# Patient Record
Sex: Female | Born: 1950
Health system: Western US, Academic
[De-identification: ages and names within clinical notes are randomized; demographics above are authoritative.]

---

## 2012-04-07 DIAGNOSIS — Z0000 Encounter for general adult medical examination without abnormal findings: Secondary | ICD-10-CM

## 2012-04-07 DIAGNOSIS — R002 Palpitations: Secondary | ICD-10-CM

## 2012-05-11 DIAGNOSIS — L219 Seborrheic dermatitis, unspecified: Secondary | ICD-10-CM

## 2013-04-14 DIAGNOSIS — Z780 Asymptomatic menopausal state: Secondary | ICD-10-CM

## 2015-11-29 DIAGNOSIS — Z9889 Other specified postprocedural states: Secondary | ICD-10-CM

## 2015-11-29 DIAGNOSIS — H2513 Age-related nuclear cataract, bilateral: Secondary | ICD-10-CM

## 2016-01-09 DIAGNOSIS — N644 Mastodynia: Secondary | ICD-10-CM

## 2016-01-29 DIAGNOSIS — C50912 Malignant neoplasm of unspecified site of left female breast: Secondary | ICD-10-CM

## 2016-06-23 ENCOUNTER — Telehealth: Payer: BLUE CROSS/BLUE SHIELD

## 2016-06-25 ENCOUNTER — Ambulatory Visit: Payer: MEDICARE | Attending: Neurology

## 2016-06-25 DIAGNOSIS — R4189 Other symptoms and signs involving cognitive functions and awareness: Secondary | ICD-10-CM

## 2016-06-26 ENCOUNTER — Ambulatory Visit: Payer: BLUE CROSS/BLUE SHIELD

## 2016-06-26 DIAGNOSIS — C50912 Malignant neoplasm of unspecified site of left female breast: Secondary | ICD-10-CM

## 2016-06-26 DIAGNOSIS — R4189 Other symptoms and signs involving cognitive functions and awareness: Secondary | ICD-10-CM

## 2016-07-03 ENCOUNTER — Ambulatory Visit: Payer: BC Managed Care – POS

## 2016-07-10 ENCOUNTER — Ambulatory Visit: Payer: BLUE CROSS/BLUE SHIELD

## 2016-07-18 ENCOUNTER — Ambulatory Visit: Payer: BC Managed Care – POS

## 2016-07-18 ENCOUNTER — Telehealth: Payer: BC Managed Care – POS

## 2016-07-22 ENCOUNTER — Ambulatory Visit: Payer: BC Managed Care – POS | Attending: Neurology

## 2016-07-28 ENCOUNTER — Ambulatory Visit: Payer: BC Managed Care – POS

## 2016-07-28 ENCOUNTER — Ambulatory Visit: Payer: BLUE CROSS/BLUE SHIELD

## 2016-07-28 DIAGNOSIS — Z1283 Encounter for screening for malignant neoplasm of skin: Secondary | ICD-10-CM

## 2016-07-28 DIAGNOSIS — D229 Melanocytic nevi, unspecified: Secondary | ICD-10-CM

## 2016-07-28 DIAGNOSIS — Z853 Personal history of malignant neoplasm of breast: Secondary | ICD-10-CM

## 2016-07-28 DIAGNOSIS — L309 Dermatitis, unspecified: Secondary | ICD-10-CM

## 2016-07-28 DIAGNOSIS — L820 Inflamed seborrheic keratosis: Secondary | ICD-10-CM

## 2016-08-12 ENCOUNTER — Ambulatory Visit: Payer: BC Managed Care – POS | Attending: Audiologist

## 2016-08-13 ENCOUNTER — Ambulatory Visit: Payer: BC Managed Care – POS

## 2016-08-13 DIAGNOSIS — M810 Age-related osteoporosis without current pathological fracture: Secondary | ICD-10-CM

## 2016-08-13 DIAGNOSIS — Z780 Asymptomatic menopausal state: Secondary | ICD-10-CM

## 2016-08-15 DIAGNOSIS — H903 Sensorineural hearing loss, bilateral: Secondary | ICD-10-CM

## 2016-08-31 ENCOUNTER — Ambulatory Visit: Payer: BLUE CROSS/BLUE SHIELD

## 2016-09-02 ENCOUNTER — Ambulatory Visit: Payer: BLUE CROSS/BLUE SHIELD | Attending: Neurology

## 2016-09-02 ENCOUNTER — Ambulatory Visit: Payer: MEDICARE

## 2016-09-02 DIAGNOSIS — R4189 Other symptoms and signs involving cognitive functions and awareness: Secondary | ICD-10-CM

## 2016-09-02 DIAGNOSIS — I83819 Varicose veins of unspecified lower extremities with pain: Secondary | ICD-10-CM

## 2016-09-04 MED ORDER — ESTRADIOL 10 MCG VA TABS
10 ug | ORAL_TABLET | VAGINAL | 3 refills | Status: AC
Start: 2016-09-04 — End: 2017-08-13

## 2016-09-23 ENCOUNTER — Ambulatory Visit: Payer: BLUE CROSS/BLUE SHIELD | Attending: Rheumatology

## 2016-09-23 DIAGNOSIS — M19042 Primary osteoarthritis, left hand: Secondary | ICD-10-CM

## 2016-09-23 DIAGNOSIS — G4700 Insomnia, unspecified: Secondary | ICD-10-CM

## 2016-09-23 DIAGNOSIS — M810 Age-related osteoporosis without current pathological fracture: Secondary | ICD-10-CM

## 2016-09-23 DIAGNOSIS — M19041 Primary osteoarthritis, right hand: Secondary | ICD-10-CM

## 2016-09-23 DIAGNOSIS — M797 Fibromyalgia: Secondary | ICD-10-CM

## 2016-09-23 DIAGNOSIS — M542 Cervicalgia: Secondary | ICD-10-CM

## 2016-09-24 ENCOUNTER — Ambulatory Visit: Payer: BLUE CROSS/BLUE SHIELD | Attending: Audiologist-Hearing Aid Fitter

## 2016-09-24 DIAGNOSIS — H8091 Unspecified otosclerosis, right ear: Secondary | ICD-10-CM

## 2016-09-24 DIAGNOSIS — H903 Sensorineural hearing loss, bilateral: Secondary | ICD-10-CM

## 2016-09-29 ENCOUNTER — Telehealth: Payer: BLUE CROSS/BLUE SHIELD

## 2016-09-30 ENCOUNTER — Telehealth: Payer: BC Managed Care – POS

## 2016-10-16 ENCOUNTER — Telehealth: Payer: BLUE CROSS/BLUE SHIELD

## 2016-10-20 ENCOUNTER — Ambulatory Visit: Payer: BLUE CROSS/BLUE SHIELD | Attending: Vascular Surgery

## 2016-10-20 DIAGNOSIS — I872 Venous insufficiency (chronic) (peripheral): Secondary | ICD-10-CM

## 2016-12-02 ENCOUNTER — Telehealth: Payer: BLUE CROSS/BLUE SHIELD

## 2016-12-03 ENCOUNTER — Ambulatory Visit: Payer: BC Managed Care – POS

## 2016-12-09 ENCOUNTER — Ambulatory Visit: Payer: BC Managed Care – POS | Attending: Rheumatology

## 2016-12-10 ENCOUNTER — Ambulatory Visit: Payer: BLUE CROSS/BLUE SHIELD

## 2016-12-12 ENCOUNTER — Telehealth: Payer: BLUE CROSS/BLUE SHIELD

## 2016-12-12 ENCOUNTER — Ambulatory Visit: Payer: BLUE CROSS/BLUE SHIELD | Attending: Radiation Oncology

## 2016-12-16 ENCOUNTER — Inpatient Hospital Stay: Payer: BLUE CROSS/BLUE SHIELD | Attending: Radiation Oncology

## 2016-12-16 DIAGNOSIS — Z170 Estrogen receptor positive status [ER+]: Secondary | ICD-10-CM

## 2016-12-16 DIAGNOSIS — C50112 Malignant neoplasm of central portion of left female breast: Secondary | ICD-10-CM

## 2016-12-16 NOTE — Progress Notes
BREAST FOLLOW-UP OFFICE VISIT NOTE    ???PATIENT: Robin Spencer  ???MRN: [id number]  ???DOB: [birth year]    ???DATE OF SERVICE: 12/16/2016    ???REFERRING PRACTITIONER: Robin Spencer., MD  ???PRIMARY CARE PROVIDER: Pregler, Weyman Croon., MD  ???RESIDENT PHYSICIAN: Robin Bucker, MD  ???ATTENDING PHYSICIAN: Robin Bucker, MD     ???CHIEF COMPLAINT: 2 week follow up after completion of RT    ???IDENTIFYING DATA: Robin Spencer is a 66 y.o. female with a pT2N0(i-), Grade 1, ER+, PR-, Her2- IDC of the left breast s/p BCS+SNB on 02/29/16. She completed RT 04/24/16, on tamoxifen.    ???RADIATION HISTORY: She received 42.56 Gy in 16 fractions to the breast followed by a boost of 10 Gy in 4 fractions to the lumpectomy cavity. Radiation was completed on 04/24/2016.     ???DATA OF INTERVAL HISTORY:  ??? Breast-FU Data Fields ~ Data ~ Comments    ??? Most recent imaging date (m/d/yyyy) ~ 12/11/16 bilat mm and [place] benign ~     ??? Local Failure (LF) ~  No Local Failure ~     ??? If LF, date (m/d/yyyy) ~  ~     ??? Regional failure (RF) ~  No Regional Failure ~     ??? If RF, date (m/d/yyyy) ~  ~     ??? Distant Mets (DM) ~  No Distant Metastases ~     ??? If DM, date (m/d/yyyy) ~  ~     ??? Any failure within RT field? ~  No ~     ??? RT related complications ~  No ~     ??? Complications ~  ~     ??? Highest complication grade  (CTCAE) ~  ---- ~  ???     Subjective:       Interval History: [name] returns for routine posttreatment follow up since completing radiation 7 months ago. She reports feeling generally well. Re the treated left breast, she reports it is paler and the NAC is paler and less sensitive and the left breast is larger and fuller than the right and is painful on a daily basis.  She does endorse a h/o baseline bilat breast tenderness which remains but left has been more tender than the right post treatment.  She went to a rheumatologist for neck and back pain and was told she had fibromyalgia. She denies cough, shortness of breath, swelling in the arm, residual fatigue, decreased range of motion in the upper extremity or shoulder stiffness, headaches.      On tamoxifen and following with Dr Robin Spencer, last seen 08/13/16. Is reporting hot flashes and vaginal dryness.  She is on vaginal estrogen inserts which per her report were ok'd by Dr Robin Spencer.    Bilat mm and [place] 12/11/16: ext dense, benign       PERTINENT PAST MEDICAL HISTORY:   Past Medical History:   Diagnosis Date   ??? Anxiety    ??? Eczema    ??? Fibroids    ??? GERD (gastroesophageal reflux disease)     esophagitis   ??? History of migraine headaches    ??? History of radiation therapy 04/2016    L breast   ??? Otosclerosis    ??? Post-operative nausea and vomiting    ??? Rectal fissure    ??? Vitreous detachment      Past Surgical History:   Procedure Laterality Date   ??? BREAST BIOPSY  1986  Fibroadenoma, ductal hyperplasia   ??? Laparoscopy for subfertility     ??? Stapedectomy, right         GYN HISTORY:   OB History   Obstetric Comments   Menses started: 49   Gravida: 0   Para:  0   Age of first live birth: n/a    Breast feed: n/a    Contraceptives/Type:  Y, 18-30    Menopause reached at: 14   Hormone replacement/Type: Y, 54-64    Fertility Tx: Y        ALLERGIES:   Allergies as of 12/16/2016 - Review Complete 12/16/2016   Allergen Reaction Noted   ??? Penicillins Other (See Comments) 01/08/2012   ??? Menadiol sodium diphosphate Rash 03/19/2016   ??? Other  02/29/2016   ??? Acetaminophen Rash 03/02/2016     MEDICATIONS:   Current Outpatient Prescriptions   Medication Sig   ??? acyclovir 400 mg tablet Take 1 tablet (400 mg total) by mouth two (2) times daily.   ??? BIOTIN PO Take 1 tablet by mouth daily .     ??? chlorhexidine 0.12% solution RINSE 15 ML'S TWICE DAILY AFTER BREAKFAST/BEFORE BEDTIME FOLLOWING BRUSHING AND FLOSSING   ??? Cholecalciferol (VITAMIN D) 1000 unit tablet Take 1,000 Units by mouth daily 200 units x5 days a wk     ??? cyanocobalamin 500 mcg tablet Take 500 mcg by mouth daily. ??? estradiol (VAGIFEM) 10 mcg vaginal tablet Place 1 tablet (10 mcg total) vaginally two (2) times a week.   ??? Omega-3 Fatty Acids (FISH OIL PO) Take 1,400 mg by mouth two (2) times daily.   ??? Prasterone (INTRAROSA) 6.5 MG INST Place 1 Dose vaginally at bedtime.   ??? tamoxifen 20 mg tablet Take 1 tablet (20 mg total) by mouth daily.   ??? triamcinolone 0.5% cream Apply topically as needed for.   ??? UNABLE TO FIND Med Name: [place] supplement- Epimedium, Tumeric, [place], Fleeceflower, EPA/DHA .     No current facility-administered medications for this encounter.        SOCIAL HX:   Social History     Social History   ??? Marital status: Married     Spouse name: N/A   ??? Number of children: N/A   ??? Years of education: N/A     Occupational History   ??? Not on file.     Social History Main Topics   ??? Smoking status: Never Smoker   ??? Smokeless tobacco: Never Used   ??? Alcohol use No   ??? Drug use: No   ??? Sexual activity: Yes     Partners: Male     Birth control/ protection: None     Other Topics Concern   ??? Not on file     Social History Narrative    Patient is married, and has two children. She is of Ashkenazi Agricultural engineer.        Work: Retired from [employer] relations        Exercise:        Diet:        Transfusion:        Religion:           FAMILY HX:    Family History   Problem Relation Age of Onset   ??? Hypertension Father    ??? Stroke Father    ??? Hyperlipidemia Father    ??? Depression Father    ??? Prostate cancer Father 4   ??? Hypertension Mother    ???  Asthma Mother    ??? Thyroid disease Mother    ??? Endometrial cancer Mother 42   ??? Prostate cancer Brother 42   ??? Breast cancer Neg Hx    ??? Ovarian cancer Neg Hx    ??? Malignant hypertension Neg Hx       Objective:      Physical Exam:  BP 129/76  ~ Pulse 70  ~ Temp 36.9 ???C (98.4 ???F) (Forehead)  ~ Resp 16  ~ Wt 123 lb (55.8 kg)  ~ LMP  (LMP Unknown)  ~ BMI 21.11 kg/m???    GENERAL: The patient is a well-developed, well-nourished, female in no acute distress. HEENT: Normocephalic and atraumatic. Anicteric sclerae. Mucous membranes are moist. Hearing is intact bilaterally.   NECK: No thyromegaly.  BREASTS: Visualization reveals very mild residual hyperpigmentation on left and left NAC is mildly hypopigmented vs right.  Left breast slightly larger than right. Palpation reveals no discrete nodulrity but bilat breasts are very dense centrally.  Very mild cutaneous and subcutaneous fibrosis on left.  LYMPHATICS: No sclav/iclav/axillary LAD palpated bilaterally  CHEST: Non labored breathing.   HEART: Normal sinus rhythm.   ABDOMEN: Soft, nontender, nondistended.   MUSCULOSKELETAL SYSTEM: No upper extremity lymphedema. Shoulder abduction and external rotation fully intact.   NEUROLOGIC EXAM: Patient is alert and oriented. Patient ambulates with a normal gait.    Lab Review / Pathology / Radiology:     See HPI     Assessment:      Robin Spencer is a 66 y.o. female with a pT2N0(i-), Grade 1, ER+, PR-, Her2- IDC of the left breast s/p BCS+SNB on 02/29/16 followed by radiation, on tamoxifen. NED     Plan/ Recommendation:      - Following with Dr. Maye Spencer, on tamoxifen  - Bilateral mammogram and ultrasound due 12/11/17, requisition placed today; supplemental U/S due to extremely dense breast tissue and mammo occult cancer  - I explained that I would be happy to follow up with her at any time in the future in person, by phone, or by email should she have any questions or concerns. I have requested follow up in  6 months.    Thank you kindly for allowing me to participate in the care of this lovely patient.       cc Pregler, Weyman Croon., MD  Robin Spencer., MD    Author: Kennedy Spencer 12/16/2016 1:49 PM

## 2016-12-16 NOTE — Patient Instructions
Thank you for visiting [place] today in [facility].     Please check out at the front desk and schedule your follow-up appointment with Dr. Lindajo Royal in  months. It is very important to follow up as instructed because it allows [place] to provide you the best care possible.      You will be due for follow up mammogram on or after September 2019 .  Please call the Women's Imaging Department at [phone] to schedule the mammogram at your convenience. The [facility] Department will obtain insurance authorization once you have scheduled the appointment.      If you have any questions or concerns between now and your next visit, please feel free to contact us at [phone].     We look forward to seeing you next time.

## 2016-12-18 DIAGNOSIS — M797 Fibromyalgia: Secondary | ICD-10-CM

## 2016-12-22 ENCOUNTER — Telehealth: Payer: BLUE CROSS/BLUE SHIELD

## 2016-12-22 NOTE — Telephone Encounter
Appointment Accommodation Request    MD Name: Dr Patrice Paradise    Appointment Type: Shingles Vaccine     Reason for sooner request: Pt state Robin Spencer advise patient to schedule nurse visit for shingles vaccine.     Date/Time Requested (If any): 12/26/2016 late morning 10:30 or 11AM    Last seen by MD: 06/02/2016    Any Symptoms:  []  Yes  [x]  No      ? If yes, duration of symptoms (how long): None    Patient was offered an appointment but declined.    Patient was advised to seek emergency services if conditions are urgent or emergent.    Patient has been notified of the 24-48 hour turnaround time.

## 2016-12-23 ENCOUNTER — Ambulatory Visit: Payer: MEDICARE | Attending: Rheumatology

## 2016-12-23 DIAGNOSIS — M19042 Primary osteoarthritis, left hand: Secondary | ICD-10-CM

## 2016-12-23 DIAGNOSIS — M797 Fibromyalgia: Secondary | ICD-10-CM

## 2016-12-23 DIAGNOSIS — M19041 Primary osteoarthritis, right hand: Secondary | ICD-10-CM

## 2016-12-23 NOTE — Progress Notes
Pt is a 66 y.o. female who presents today for:  No chief complaint on file.      Subjective:    Last visit consult 09/23/2016:    66 y.o. female referred by Pregler, Weyman Croon., MD for neck, shoulder, hand, and wrist pain and stiffness.   ???  Patient has been having chronic pain on the L side of the neck, particularly when she looks up with limitation in the range of motion. Patient goes to yoga and afterwards she may have some soreness. Patient says ''it is always been here since 2010.'' This was flared by a modified epley maneuver for BPPV treatment.   ???  She has morning stiffness for 15-20 minutes in the neck, shoulders, lateral hips. Patient has arthritis in the R big toe that is being addressed with orthotics. She also knows of arthritis in the small joints of the fingers, particularly middle, ring, and pinky finger PIPs. She has the L 3rd DIP mucoid cyst that occurred after a trauma at the gas pump with nail dystrophy. The DIPs in the R hand have also been getting larger, achy and she feels that her grip strength in her hands is weaker. Denies tingling/numbness in hands. Patient does use the computer for about an hour everyday.   ???  Patient is able to sleep on her sides without discomfort in the hips. Patient says sometimes she does get discomfort in the shoulders when she sleeps on the sides. Denies limitations in range of motion shoulder. Denies h/o frozen shoulder.  ???  Patient has had lower back pain. She recalls about 30 years ago having a bad back being in bed for about 5 weeks. She has a herniated disc L5-S1 but she learned how to manage it. Patient concludes that most of her pain is in the shoulders, neck area, top of iliac bone bilaterally, and hands. Yoga does exacerbate her aches and pains but she likes to do because it ''is good for me.'' Patient has never tried water PT.   ???  Family history significant for Osteoarthritis but not Rheumatoid Arthritis.  ??? She denies h/o psoriasis, IBD, eye inflammation.  ???  Patient had breast cancer diagnosed last year, s/p surgery and on tamoxifen. Fortunately patient has not noticed worsening of the musculoskeletal symptoms on tamoxifen.   ???  Patient says that most nights she does not get 8 hours of sleep with difficulty falling asleep if she wakes up during the night. She finds that an overactive mind keeps her from falling asleep. She usually gets up twice to urinate. When she wakes in the morning she feels ''tired and stiff'' but that gets better with activity. ''I'm accustomed to it.''  ???  Patient is an optimistic person by nature. Denies significant mood problems such as depression and anxiety.   ???  Mother has insomnia and IBS.       Interval history 12/23/2016:    ***                ROS No Yes   No Yes    No Yes  Fever [x]   []  Vision loss [x]   []  Unexpected bleeding [x]   []       Impression:     - Fibromyalgia. H/o migraine headaches before menopause.    []  controled            []  low activity           []  moderate activity []  highly active         -  Osteoarthritis hands   []  controled            []  low activity           []  moderate activity []  highly active         - Neck pain,  likely combination of myofascial pain and spondylosis    []  controled            []  low activity           []  moderate activity []  highly active         - Osteoporosis, under the care of Dr. Fabienne Bruns    []  controled            []  low activity           []  moderate activity []  highly active         - Insomnia   []  controled            []  low activity           []  moderate activity []  highly active             Lab Results   Component Value Date    VITD25OH 23 09/23/2016    VITD25OH 22 06/26/2016     Plan:   - Blood tests today  ???  - Xray cervical spine, neck pain likely combination of myofascial pain and spondylosis   ???  - Consider low dose Gabapentin   ???  - Patient can participate in the shared medical appointment visit for education of fibromyalgia diagnosis and treatment, next upcoming shared medical appointment: September 25th at 4:30 PM in our office.  ???  - Referral to [facility] for treatment of fibromyalgia. Consider massage, acupuncture.   ???  - Referral to endocrinologist, Dr. Hetty Ely, for evaluation and treatment of osteoporosis  ???  - Referral to water PT for treatment of fibromyalgia and insomnia   Try PT Programmer, systems Specialist)              [facility]              [address]                               [address]              [address]                                                         [address]              [place], [place] [address]                                  [place], [place] [address]              [phone]                                               [phone]  ???  - Referred to Alesia Morin, occupational therapist for treatment of fibromyalgia and insomnia  [phone]  [email or web address]  [address], [place] [address]  ???  - Vit D level is 22. Per new reference range of normal vitamin D level, the lower limit is now considered to be 20 where as in the past it was 30. You are now in the low normal.  Continue vit D and ideally is to bring the level in the 30-40 by increasing your OTC daily dose of vit d3. Check level today.  ???  - Please add magnesium citrate 250-300 mg daily. Patient may consider calcium citrate 500 mg in combination with 250 mg daily.  ???  - Continue turmeric, ginger, and fish oil supplementation   ???  - Patient can talk to her breast oncologist about the safety of avocado soybean unsaponfiable (ASU) supplementation for Osteoarthritis   ???  - Patient can try OTC lemon balm tea to help with tension and stress     No orders of the defined types were placed in this encounter.      I discussed with patient at length need of regular exercise, weight management and methods of coping with psychosocial stressors.   I reviewed the plan of care in detail with patient. Patient had the opportunity to ask questions and expressed satisfaction with the plan of care.  The benefits and risks of medications were also explained in detail.    []  If box is checked, patient advised to monitor CBC (white blood cell count, hemoglobin, platelet count), liver enzyme levels (AST, ALT), and kidney function (creatinine) every 3 months for possible __ side effects.    []  If box is checked, patient is taking plaquenil (hydroxychloroquine), and advised to schedule retinal exam/eye exam by ophthomology once a year.     []  Total of  __  minutes spent on the visit, with    >50% of time spent directly counseling the patient.     See A/P section for content of discussion        Procedures:         Past Medical History:   No specialty comments available.    Patient Active Problem List    Diagnosis Date Noted   ??? Syncope and collapse 06/25/2016     2018, ECG/Labs/MRI w/o gad unrevealing, saw neurology Dr. Lorina Rabon, plan; EEG, followup      ??? Immune to measles 05/09/2016     (+) Ab 2018     ??? Invasive ductal carcinoma of left breast, ER+PR+HER2- 01/29/2016     2017, Bilateral tomosynthesis screening mammogram on 12/11/2015. There were no suspicious findings seen. She had postsurgical changes in both breasts. Patient then developed bilateral breast pain, left greater than right. She was sent for bilateral diagnostic ultrasounds on 01/15/2016. The right breast was negative. However, in the left breast, there was an irregular mass seen with indistinct margins, measuring 5 x 6 x 4 mm, at 12 o'clock, 6 cm from the nipple. Her left axilla was negative. Genetic testing: NO MUTATION DETECTED/This panel tests three Founder Mutations in the BRCA1/2 genes (BRCA1 187delAG, BRCA1 5385insC, BRCA2 6174delT; lumpectomy: sentinal node negative x 2.  TAM recommended by oncology, annual endometrial surveillance given history by GYN.      ??? Breast pain 01/09/2016     [facility] plan per NP, imaging, lifestyle changes     ??? Cataract, nuclear sclerotic, both eyes 11/29/2015  Not yet visually significant. Continue observation.      ??? Status post LASIK surgery of both eyes 11/29/2015     Performed by Dr. Ellin Goodie (2000 ?). Originally set with left eye for distance and low myopia for right, but now near emmetropia both eyes.     ??? Fibroid 04/09/2015     2017 on ultrasound.      ??? BPPV (benign paroxysmal positional vertigo) 07/19/2013   ??? Menopause 04/14/2013   ??? Seborrheic dermatitis 05/11/2012   ??? 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 PPD positive 04/07/2012   ??? Palpitations 04/07/2012     2009 ER PACs, PVCs, sinus arrythmia, ECHO, Stress ECHO WNL 2009     ??? OA (osteoarthritis) 04/07/2012     Hands 2011, anti CCP neg, no evidence inflammation     ??? Visit for preventive health examination 04/07/2012     Colo noscopy= hyperplastic polyps 2017     ??? Hearing loss, right 04/07/2012     Otosclerosis, s/p stapedectomy     ??? Eye allergies 01/08/2012     Using ketotifen as needed but could consider using on a more regular basis. May also use artificial tears as needed.     ??? Osteoporosis 11/10/2011     DEXA 2013, spine-2.4, femoral neck -2.9. On HT for menopause sx and as osteoporosis rx.; PA lumbar spine T-score of -2.7, will begin TAM per oncology.          Past Medical History relevant to rheumatology:         Current Medications Relevant to Rheumatology:     Rheum Meds       Disp Refills Start End    tamoxifen 20 mg tablet 30 tablet 11 04/16/2016     Sig - Route: Take 1 tablet (20 mg total) by mouth daily. - Oral        Rheum Meds Other     Oil Soluble Vitamins Sig    Cholecalciferol (VITAMIN D) 1000 unit tablet Take 1,000 Units by mouth daily 200 units x5 days a wk             Allergies   Allergen Reactions   ??? Penicillins Other (See Comments)     Ampicillin, flesh rash in 1980s ??? Menadiol Sodium Diphosphate Rash   ??? Other      Red  coloring on meds such as advil- not allergic to actual med- just the coloring   ??? Acetaminophen Rash       Physical Exam:   LMP  (LMP Unknown)   Checked Box= body part examined.  Abnormalities noted are listed next to the body part.   NL AbNL  Comment  NL AbNL Comment   General [x]  []   ENT      Eyes       Oropharynx []   []         Conjunct/Sclera [x]   []       Nasal mucosa []   []         Lids/Periorbital [x]   []       Ears []   []         EOM [x]   []    Neck         Pupils [x]   []       Inspect/palp []   []      Psychiatric       Thyroid []   []         Oriented (p,p,t) [x]   []       Neck ROM/spple []   []   Affect [x]   []    Lymphatic         Judgemnt/insite [x]   []       Neck []   []      Neurological       Supraclavicular []   []         CN I-XII [x]   []    Respiratory         Sensation [x]   []       Auscultation []   []      Skin         Chest expansn []   []         Inspect [x]   []    Cardiovascular         Palpate [x]   []       Auscultation []   []         Periungal cap bed []   []       Depnt edema []   []      Musculoskeletal    GI          Motor []   []       Abdomen []   []        Gait []   []       Liver/Spleen []   []        Nailbeds []   [x]    Other findings:      []  28 Joints, or  []  64 joints were examined.    Either exam includes all 4 extremites. Except where noted, the joints examined have FROM, no tenderness or swelling; normal stability, and normal strength.    Abnormalities are coded on homonculus or detailed below:  - L 3rd DIP tenderness to palpation  - tenderness to palpation R hand DIPs   - Bony hypertrophy small joints of the fingers, particularly middle, ring, and pinky finger PIPs.   -  L 3rd DIP mucoid cyst with nail dystrophy.   - Enlarged R hand DIPs  - wrist and elbow exam unremarkable  - shoulders with preserved range of motion   - Negative resisted abduction test in shoulders b/l   - ITB bilaterally tenderness to palpation   - hypersensitivity to light touch ~~~~~~~~~~~~~~~~~~~~~~~~~~~~~~~~~~~~~~~~~~~~~~~~~~~~~~~~~~~~~~~~    RIGHT         LEFT   TMJ [] []   ~ [] []  TMJ   Shoulder [] []  ~ [] []  Shoulder                Left box: Tender   Elbow [] []  ~ [] []  Elbow               Right box: Swollen   Wrist [] []  ~ [] []  Wrist   CMC1 [] []  ~ [] []  CMC1       MCP5 MCP4 MCP3 MCP2 MCP1         ~ MCP1 MCP2 MCP3 MCP4 MCP5       [] []  [] []  [] []  [] []  [] []   [] []  [] []  [] []  [] []  [] []        PIP5 PIP4 PIP3 PIP2 IP1 ~ IP1 PIP2 PIP3 PIP4 PIP5       [] []  [] []  [] []  [] []  [] []   [] []  [] []  [] []  [] []  [] []        DIP5 DIP4 DIP3 DIP2  ~  DIP2 DIP3 DIP4 DIP5       [] []  [] []  [] []  [] []     [] []  [] []  [] []  [] []       Sacroiliac []  ~             []  Sacroiliac                Hip  [] []  ~        [] []   Hip                  Knee  [] []  ~        [] []  Knee        Ankle    [] []  ~        [] []  Ankle           MTP5 MTP4 MTP3 MTP2 MTP1 ~ MTP1 MTP2 MTP3 MTP4 MCP5       [] []  [] []  [] []  [] []  [] []   [] []  [] []  [] []  [] []  [] []        Toe5 Toe4 Toe3 Toe2 Toe1 ~ Toe1 Toe2 Toe3 Toe4 Toe5        [] []  [] []  [] []  [] []  [] []   [] []  [] []  [] []  [] []  [] []                      ~~~~~~~~~~~~~~~~~~~~~~~~~~~~~~~~~~~~~~~~~~~~~~~~~~~~~~~~~~~~~~~~  Fibromyalgia tender point count: __/18      Recent Labs and Test Results:     Lab Results   Component Value Date    CRP <0.3 09/23/2016    CRP <0.5 05/19/2007    SRWEST 6 09/23/2016    SRWEST 11 05/19/2007     Lab Results   Component Value Date    WBC 3.76 (L) 09/23/2016    HGB 13.1 09/23/2016    HCT 37.3 09/23/2016    PLT 202 09/23/2016    NEUTABS 2.08 09/23/2016    LYMPHABS 1.12 (L) 09/23/2016     Lab Results   Component Value Date    GLUCOSE 92 09/23/2016    CREAT 0.73 09/23/2016    CALCIUM 9.1 09/23/2016    TOTPRO 6.6 09/23/2016    ALBUMIN 4.5 09/23/2016    AST 14 09/23/2016    ALT 10 09/23/2016    ALKPHOS 56 09/23/2016     No results found for: C3, C4, DSDNAAB, NDNAABIFA  Lab Results   Component Value Date    PROTCLUR Negative 02/14/2016    BLDUR 1+ (A) 02/14/2016    LEUKESTUR Negative 02/14/2016 RBCSUR 11 02/14/2016    WBCSUR 2 02/14/2016     Lab Results   Component Value Date    VITD25OH 23 09/23/2016    VITD25OH 22 06/26/2016     Lab Results   Component Value Date    CKTOT 64 06/02/2016       No visits with results within 1 Month(s) from this visit.   Latest known visit with results is:   Office Visit on 09/23/2016   Component Date Value   ??? HBs Ag 09/23/2016 Nonreactive    ??? HBs Ab Quant 09/23/2016 <10    ??? HBc Ab, IgM 09/23/2016 Nonreactive    ??? HBc Ab, Total 09/23/2016 Nonreactive    ??? HCV Ab Screen 09/23/2016 Nonreactive    ??? Sodium 09/23/2016 141    ??? Potassium 09/23/2016 4.8    ??? Chloride 09/23/2016 101    ??? Total CO2 09/23/2016 24    ??? Anion Gap 09/23/2016 16    ??? Glucose 09/23/2016 92    ??? GFR Estimate for Non-Afr* 09/23/2016 87    ??? GFR Estimate for African* 09/23/2016 >89    ??? GFR Additional Informati* 09/23/2016 See Comment    ??? Creatinine 09/23/2016 0.73    ??? Urea Nitrogen 09/23/2016 15    ??? Calcium 09/23/2016 9.1    ??? Total Protein 09/23/2016 6.6    ??? Albumin 09/23/2016 4.5    ??? Bilirubin,Total 09/23/2016 0.4    ??? Alkaline Phosphatase 09/23/2016 56    ???  Aspartate Aminotransfera* 09/23/2016 14    ??? Alanine Aminotransferase 09/23/2016 10    ??? Sedimentation rate, Eryt* 09/23/2016 6    ??? C-Reactive Protein 09/23/2016 <0.3    ??? Vitamin D,25-Hydroxy 09/23/2016 23    ??? Antinuclear Ab 09/23/2016 <1:40    ??? Cyclic Citrulline Ab IgG 09/23/2016 4    ??? Rheumatoid Factor 09/23/2016 <10    ??? White Blood Cell Count 09/23/2016 3.76*   ??? Red Blood Cell Count 09/23/2016 3.74*   ??? Hemoglobin 09/23/2016 13.1    ??? Hematocrit 09/23/2016 37.3    ??? Mean Corpuscular Volume 09/23/2016 99.7*   ??? Mean Corpuscular Hemoglo* 09/23/2016 35.0*   ??? MCH Concentration 09/23/2016 35.1    ??? Red Cell Distribution Wi* 09/23/2016 47.0    ??? Red Cell Distribution Wi* 09/23/2016 13.5    ??? Platelet Count, Auto 09/23/2016 202    ??? Mean Platelet Volume 09/23/2016 10.7    ??? Nucleated RBC%, automated 09/23/2016 0.0 ??? Absolute Nucleated RBC C* 09/23/2016 0.00    ??? Neutrophil Abs (Prelim) 09/23/2016 2.08    ??? Neutrophil Percent, Auto 09/23/2016 55.3    ??? Lymphocyte Percent, Auto 09/23/2016 29.8    ??? Monocyte Percent, Auto 09/23/2016 10.6    ??? Eosinophil Percent, Auto 09/23/2016 2.7    ??? Basophil Percent, Auto 09/23/2016 1.1    ??? Immature Granulocytes% 09/23/2016 0.5    ??? Absolute Neut Count 09/23/2016 2.08    ??? Absolute Lymphocyte Count 09/23/2016 1.12*   ??? Absolute Mono Count 09/23/2016 0.40    ??? Absolute Eos Count 09/23/2016 0.10    ??? Absolute Baso Count 09/23/2016 0.04    ??? Absolute Immature Gran C* 09/23/2016 0.02          Attestation:  Scribe Signature:  I, Scharlene Catalina, have scribed for Dr. Linus Mako with the documentation for Rejeana Brock on 12/23/2016 at 8:02 AM.    Physician Signature:  Dr. Linus Mako 12/23/2016 8:02 AM     I have reviewed this note, scribed by Mariane Baumgarten and attest that it is an accurate representation of my H & P and other events of the outpatient visit except if otherwise noted.

## 2016-12-23 NOTE — Telephone Encounter
Patient scheduled for 10/12 @ 10:30 am

## 2016-12-23 NOTE — Patient Instructions
-   Consider trial of low dose naltrexone (LDN).    - Continue PT                          - Increase OTC vitamin D3 supplementation to 5000 IU per day. Repeat vitamin D level in 3 months.     - Continue magnesium citrate 250-300 mg daily and calcium citrate 500 mg in combination with 250 mg daily.    - Continue turmeric, ginger, and fish oil supplementation      - Continue OTC lemon balm tea to help with tension and stress     - Follow with referral to [facility] for treatment of fibromyalgia. Consider massage, acupuncture.     - Follow with referral to endocrinologist, Dr. Deatra Ina, for evaluation and treatment of osteoporosis    - Follow with referral to Skip Mayer, occupational therapist for treatment of fibromyalgia and insomnia  [phone]  [email or web address]  [place], [place]. [address] [place] [address] [address]    - I taught patient the 4-7-8 Breath Relaxation Exercise and Progressive Muscle Relaxation:   Exhale completely through your mouth, making a whoosh sound.  Close your mouth and inhale quietly through your nose to a mental count of four.  Hold your breath for a count of seven.  Exhale completely through your mouth, making a whoosh sound to a count of eight.  This is one breath. Now inhale again and repeat the cycle three more times for a total of four breaths.    From last visit:   - Patient can talk to her breast oncologist about the safety of avocado soybean unsaponfiable (ASU) supplementation for Osteoarthritis

## 2016-12-26 ENCOUNTER — Ambulatory Visit: Payer: BLUE CROSS/BLUE SHIELD

## 2016-12-26 ENCOUNTER — Ambulatory Visit: Payer: BC Managed Care – POS

## 2016-12-26 DIAGNOSIS — Z23 Encounter for immunization: Secondary | ICD-10-CM

## 2016-12-26 MED ORDER — ZOSTER VAC RECOMB ADJUVANTED 50 MCG/0.5ML IM SUSR
0 refills | Status: AC
Start: 2016-12-26 — End: 2017-08-06

## 2017-01-16 ENCOUNTER — Ambulatory Visit: Payer: BLUE CROSS/BLUE SHIELD | Attending: Audiologist-Hearing Aid Fitter

## 2017-01-16 ENCOUNTER — Institutional Professional Consult (permissible substitution): Payer: BC Managed Care – POS

## 2017-01-16 ENCOUNTER — Ambulatory Visit: Payer: BLUE CROSS/BLUE SHIELD

## 2017-01-16 DIAGNOSIS — Z23 Encounter for immunization: Secondary | ICD-10-CM

## 2017-01-16 NOTE — Nursing Note
Patient came in for a Nurse Visit today  01/16/2017 to have  the Influenza vaccine . Patient tolerated injection well.

## 2017-01-21 ENCOUNTER — Ambulatory Visit: Payer: BLUE CROSS/BLUE SHIELD

## 2017-01-21 ENCOUNTER — Ambulatory Visit: Payer: BLUE CROSS/BLUE SHIELD | Attending: "Endocrinology

## 2017-01-21 DIAGNOSIS — R5383 Other fatigue: Secondary | ICD-10-CM

## 2017-01-21 DIAGNOSIS — M810 Age-related osteoporosis without current pathological fracture: Secondary | ICD-10-CM

## 2017-01-21 DIAGNOSIS — F341 Dysthymic disorder: Secondary | ICD-10-CM

## 2017-01-21 DIAGNOSIS — E041 Nontoxic single thyroid nodule: Secondary | ICD-10-CM

## 2017-01-21 NOTE — Progress Notes
OUTPATIENT ENDOCRINOLOGY CONSULTATION    Date of Service: 01/21/2017  Referring Provider: Gabriela Eves., MD  PMD: Pregler, Weyman Croon., MD    Chief Complaint:   Osteoporosis     History of Present Illness:   Robin Spencer is a 66 y.o. female who has a past medical history of Anxiety; Eczema; Fibroids; GERD (gastroesophageal reflux disease); History of migraine headaches; History of radiation therapy (04/2016); Otosclerosis; Post-operative nausea and vomiting; Rectal fissure; and Vitreous detachment. and presents to Endocrinology Clinic for Osteoporosis.    Osteoporosis  The patient was first found to have low bone density in 2013.    Fibromyalgia diagnosed by rheumatologist Dr. Ladona Ridgel, but pt does not feel like her pain is debilitating.    Prev on bioidentical hormones for HRT - stopped in 01/2016 or so upon dx of breast cancer.  Now on tamoxifen.    Constantly tired.  Waking up at least twice in the middle of the night to go urinate, also getting some hot flashes.  Feeling less productive and less motivated.  Feeling mildly depressed.  Has appointment on Friday with therapist.    Not exercising as much.  Prev doing yoga TIW and hiking.  Now not doing yoga.  Walking maybe once a week - recently.    Lost 10 lbs intentionally.      Treatment History   Dates Comments    [x]  No treatment to date            Risk Factors for Accelerated Bone Loss        History of fracture  []  Yes  [x]  No    History of falls  []  Yes  [x]  No    History of malabsorption  []  Yes  [x]  No    History of nephrolithiasis  []  Yes  [x]  No    History of rheumatoid arthritis  []  Yes  [x]  No    History of hyperthyroidism  []  Yes  [x]  No    History of Cushing's  []  Yes  [x]  No    Family history of osteoporosis  [x]  Yes  []  No Osteopenia in mother   Prior/current glucocorticoid use  []  Yes  [x]  No    Antiepilepsy drugs  []  Yes  [x]  No    PPI  []  Yes  []  No Prilosec in the past for weeks only   Aromatase inhibitors  []  Yes  [x]  No Androgen deprivation therapy  []  Yes  [x]  No    EtOH  []  Yes  []  No Rare alcohol   Tobacco  []  Yes  [x]  No    Vitamin D  []  Yes  []  No Vitamin D3 5000 IU daily   Calcium    []  Yes  []  No Was taking calcium supplement then stopped and restarted about 6 weeks ago - taking 630 mg (taking BID, total is 630 mg)  Also taking Mg 150 BID.  Dietary calcium: 1/3 cup yogurt, kale/spinach daily in smoothie, some cheese, 1/4 cup almond milk      Past Medical History:     Past Medical History:   Diagnosis Date   ??? Anxiety    ??? Eczema    ??? Fibroids    ??? GERD (gastroesophageal reflux disease)     esophagitis   ??? History of migraine headaches    ??? History of radiation therapy 04/2016    L breast   ??? Otosclerosis    ??? Post-operative nausea and vomiting    ???  Rectal fissure    ??? Vitreous detachment        Past Surgical History:     Past Surgical History:   Procedure Laterality Date   ??? BREAST BIOPSY  1986    Fibroadenoma, ductal hyperplasia   ??? Laparoscopy for subfertility     ??? Stapedectomy, right       Medications:     Current Outpatient Prescriptions   Medication Sig   ??? acyclovir 400 mg tablet Take 1 tablet (400 mg total) by mouth two (2) times daily.   ??? BIOTIN PO Take 1 tablet by mouth daily .     ??? CALCIUM CITRATE PO Take 630 mg by mouth daily.   ??? Cholecalciferol (VITAMIN D) 1000 unit tablet Take 1,000 Units by mouth daily 200 units x5 days a wk     ??? cyanocobalamin 500 mcg tablet Take 500 mcg by mouth daily.   ??? estradiol (VAGIFEM) 10 mcg vaginal tablet Place 1 tablet (10 mcg total) vaginally two (2) times a week.   ??? MAGNESIUM CITRATE PO Take 150 mg by mouth two (2) times daily.   ??? tamoxifen 20 mg tablet Take 1 tablet (20 mg total) by mouth daily.   ??? triamcinolone 0.5% cream Apply topically as needed for.   ??? zoster vac recomb adjuvanted (SHINGRIX) 50 mcg injection 2nd of 2 Shingrix vaccinations now at pharmacy.   ??? Omega-3 Fatty Acids (FISH OIL PO) Take 1,400 mg by mouth two (2) times daily. ??? UNABLE TO FIND Med Name: [place] supplement- Epimedium, Tumeric, Japanese, Fleeceflower, EPA/DHA .     No current facility-administered medications for this visit.      Allergies:     Allergies   Allergen Reactions   ??? Penicillins Other (See Comments)     Ampicillin, flesh rash in 1980s   ??? Menadiol Sodium Diphosphate Rash   ??? Other      [place] coloring on meds such as advil- not allergic to actual med- just the coloring   ??? Acetaminophen Rash     Social History:    reports that she has never smoked. She has never used smokeless tobacco. She reports that she does not drink alcohol or use drugs.   Social History     Social History Narrative    Patient is married, and has two children. She is of Ashkenazi Agricultural engineer.        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 Hypertension Father    ??? Stroke Father    ??? Hyperlipidemia Father    ??? Depression Father    ??? Prostate cancer Father 5   ??? Hypertension Mother    ??? Asthma Mother    ??? Endometrial cancer Mother 68   ??? Hyperthyroidism Mother    ??? Prostate cancer Brother 12   ??? Breast cancer Neg Hx    ??? Ovarian cancer Neg Hx    ??? Malignant hypertension Neg Hx      Review of Systems:   14 system ROS negative except as described above and in the HPI.  Physical Examination:   Vital Signs: BP (!) 88/61  ~ Pulse 81  ~  Ht 5' 4'' (1.626 m)  ~ Wt 124 lb (56.2 kg)  ~ LMP  (LMP Unknown)  ~ BMI 21.28 kg/m???  Body mass index is 21.28 kg/m???.   Wt Readings from Last 3 Encounters:   01/21/17 124 lb (56.2 kg)   12/23/16 123 lb 6.4 oz (56 kg)   12/16/16 123 lb (55.8 kg)      General: Well-developed, well-nourished, in no acute distress.   HEENT: Normocephalic, atraumatic. PERRL. EOMI. No lid lag.  No proptosis.  No scleral icterus. Oropharynx clear. Moist mucous membranes. Neck: Supple. No cervical lymphadenopathy. No thyromegaly. Small left sided thyroid nodule palpated.  Cardiovascular: Regular rate and rhythm S1 and S2 normal. No murmurs, rubs, or gallops.   Pulmonary: Clear to auscultation bilaterally. Normal respiratory effort.   Abdominal: Soft, nontender, and nondistended. Normoactive bowel sounds.   Extremities: Pulses 2+ radial and dorsalis pedis bilaterally. No lower extremity edema.  Skin: No rashes, no jaundice.  MSK: minimal kyphosis.  no tenderness to palpation of spine.  Neuro: AAOx4, moving all extremities, gross sensation intact bilaterally.  2+ biceps reflexes B/L.  No tremor of outstretched hands.  Psych: Normal affect.    Laboratory Results:   I have   [] reviewed radiology,  [] reviewed labs,  [] reviewed & summarized old records, [] requested outside medical records.    Lab Results   Component Value Date    CALCIUM 9.1 09/23/2016    CREAT 0.73 09/23/2016    ALBUMIN 4.5 09/23/2016    VITD25OH 23 09/23/2016    TSH 1.3 06/26/2016    ALKPHOS 56 09/23/2016       Lab Results   Component Value Date    WBC 3.76 (L) 09/23/2016    HGB 13.1 09/23/2016    HCT 37.3 09/23/2016    MCV 99.7 (H) 09/23/2016    PLT 202 09/23/2016       Bone Turnover Markers  Date UNTx BSAP Serum OC                Studies:      DXA 04/17/16 Lenell Antu)  Assessment:  1. The left hip and femoral neck T-score's of -2.2 and -2.3 respectively, meet the criteria for osteopenia.  2. The PA lumbar spine T-score of -2.7 meets the criteria for osteoporosis according to the [facility] classification.    3. The *lateral lumbar spine T-score = n/a    DXA 11/03/11 ([facility])  BASELINE DUAL ENERGY X-RAY ABSORPTIOMETRY (DXA) STUDY  Bone densitometry was performed using a Hologic Discovery A bone densitometer.  ???  PA Spine:       The average bone mineral density from L1-4 is 0.787 g/cm2, 2.4 standard deviations below the mean for normal young adults (*T-scores) or 75% of normal. Compared to age and sex-matched controls (**Z-scores), this density is 0.9 standard deviations   below the mean.  ???  Total Hip:       The bone mineral density in the left total hip is 0.706 g/cm2, 1.9 standard deviations below the mean for normal young adults (*T-scores) or 75% of normal.  Compared to age and sex-matched controls (**Z-scores), this density is 1.0 standard   deviations below the mean.  ???  Femoral Neck:       The bone mineral density in the left femoral neck is 0.531 g/cm2, 2.9 standard deviations below the mean for normal young adults (*T-scores) or 63% of normal.  Compared to age and sex-matched controls (**Z-scores), this density is 1.6 standard   deviations below the  mean.  ???  *T-score compares the BMD of an individual to peak bone mass and expresses the difference as a standard deviation score,**Z-score compares the BMD of an individual with age-matched, gender-matched, and race-matched controls and expresses the difference   as a standard deviation score.  ???  IMPRESSION:  1.  This is a baseline study at the [facility].  2.  The patient has osteoporosis.    DEXA  Date LS T-score FN T-score TH T-score Rad T-score % Change BMD                Assessment:   Jaymi Tinner is a 66 y.o. female seen in the Endocrine clinic for evaluation and management of osteoporosis.    Plan:   1. Osteoporosis, unspecified osteoporosis type, unspecified pathological fracture presence: diagnosed in 2013 on DXA, about stable on DXA in 2018 but on different machine.  Risk factors for accelerated bone loss: +fam hx, post-menopausal woman, Caucasian, thin.  - pt is hesitant to start medication but if bone turnover markers are elevated would be amenable.  - Osteocalcin, N-MID (Bone Gla); Future  - Alkaline Phosphatase,Bone Spec; Future  - N-Telopeptide,random urine; Future  - PTH,Intact & Calcium; Future  - Vitamin D,25-Hydroxy; Future  - Encouraged weight bearing exercise - Goal calcium intake is 1200 mg of elemental calcium per day, ideally more in diet than in supplement.  - Goal vitamin D intake is 800 IU of vitamin D3 per day. Goal vit D 25 OH level is >30.  - Next DXA is planned for 04/2018    2. Thyroid nodule: small nodule palpated  - US thyroid-parathyroid; Future    3. Other fatigue  - TSH; Future  - Free T4; Future    4. Dysthymia: symptoms are concerning for depression  - pt is seeing therapist on Friday    5. Invasive ductal carcinoma of left breast, ER+PR+HER2-  - on Tamoxifen        Patient Instructions     1.  Foods and drinks with calcium   Food Calcium, milligrams   Milk (skim, 2 percent, or whole, 8 oz [240 mL]) 300   Yogurt (6 oz [168 g]) 250   [place] juice (with calcium, 8 oz [240 mL]) 300   Tofu with calcium (1/2 cup [660 g]) 435   Cheese (1 oz [28 g]) 195 to 335 (hard cheese = higher calcium)   Cottage cheese (1/2 cup [630 g]) 130   Ice cream or frozen yogurt (1/2 cup [160 g]) 100   Soy milk (8 oz [240 mL]) 300   Beans (1/2 cup cooked [109 g]) 60 to 80   Dark, leafy green vegetables (1/2 cup cooked [323 g]) 50 to 135   Almonds (24 whole) 70   [place] (1 medium) 60   ???  ??? People who are getting adequate calcium from dietary intake alone (approximately 1200 mg daily) do not need to take calcium supplements.   ???  ??? People with inadequate dietary intake should take supplemental elemental calcium (generally 500 to 1000 mg/day), in divided doses at mealtime, such that their total calcium intake (diet plus supplements) approximates 1200 mg/day  ???  Source: uptodate 2016    2.  Schedule your thyroid ultrasound.  Please call Radiology Central Scheduling to schedule [phone].    3.  Get your labs fasting and in the morning at a [facility] lab near you. If your bone turnover markers are high then I will have you  come back for an appointment so we can discuss your options for treatment of your osteoporosis.      Return in about 1 year (around 01/21/2018).    CC Dr. Ladona Ridgel 45 minutes spent with patient. Greater than 50% of the visit was spent counseling the above issues.    Author:  Lorenza Chick, MD 01/21/2017 10:55 AM

## 2017-01-21 NOTE — Patient Instructions
1.  Foods and drinks with calcium   Food Calcium, milligrams   Milk (skim, 2 percent, or whole, 8 oz [240 mL]) 300   Yogurt (6 oz [168 g]) 250   [place] juice (with calcium, 8 oz [240 mL]) 300   Tofu with calcium (1/2 cup [161 g]) 435   Cheese (1 oz [28 g]) 195 to 335 (hard cheese = higher calcium)   Cottage cheese (1/2 cup [096 g]) 130   Ice cream or frozen yogurt (1/2 cup [045 g]) 100   Soy milk (8 oz [240 mL]) 300   Beans (1/2 cup cooked [409 g]) 60 to 80   Dark, leafy green vegetables (1/2 cup cooked [811 g]) 50 to 135   Almonds (24 whole) 70   [place] (1 medium) 60   ???  ??? People who are getting adequate calcium from dietary intake alone (approximately 1200 mg daily) do not need to take calcium supplements.   ???  ??? People with inadequate dietary intake should take supplemental elemental calcium (generally 500 to 1000 mg/day), in divided doses at mealtime, such that their total calcium intake (diet plus supplements) approximates 1200 mg/day  ???  Source: uptodate 2016    2.  Schedule your thyroid ultrasound.  Please call Radiology Central Scheduling to schedule [phone].    3.  Get your labs fasting and in the morning at a [facility] lab near you. If your bone turnover markers are high then I will have you come back for an appointment so we can discuss your options for treatment of your osteoporosis.

## 2017-01-22 ENCOUNTER — Institutional Professional Consult (permissible substitution): Payer: BC Managed Care – POS

## 2017-01-22 DIAGNOSIS — M810 Age-related osteoporosis without current pathological fracture: Secondary | ICD-10-CM

## 2017-01-22 DIAGNOSIS — R5383 Other fatigue: Secondary | ICD-10-CM

## 2017-01-22 DIAGNOSIS — N644 Mastodynia: Secondary | ICD-10-CM

## 2017-01-22 DIAGNOSIS — R52 Pain, unspecified: Secondary | ICD-10-CM

## 2017-01-23 ENCOUNTER — Ambulatory Visit: Payer: BLUE CROSS/BLUE SHIELD

## 2017-01-23 DIAGNOSIS — E213 Hyperparathyroidism, unspecified: Secondary | ICD-10-CM

## 2017-01-23 LAB — Free T4: FREE T4: 1.3 ng/dL (ref 0.8–1.7)

## 2017-01-23 LAB — Vitamin D,25-Hydroxy: VITAMIN D,25-HYDROXY: 33 ng/mL (ref 20–50)

## 2017-01-23 LAB — PTH, Intact: PTH, INTACT: 62 pg/mL — ABNORMAL HIGH (ref 11–51)

## 2017-01-23 LAB — Calcium for PTH: CALCIUM: 9.3 mg/dL (ref 8.6–10.4)

## 2017-01-23 LAB — TSH: TSH: 1.5 u[IU]/mL (ref 0.3–4.7)

## 2017-01-24 ENCOUNTER — Ambulatory Visit: Payer: BC Managed Care – POS

## 2017-01-25 LAB — Alkaline Phosphatase,Bone Spec: ALKALINE PHOSPHATASE,BONE SPEC: 10.8 ug/L

## 2017-01-27 LAB — Osteocalcin, N-MID (Bone Gla): OSTEOCALCIN, N-MID (BONE GLA): 15 ng/mL (ref 11–50)

## 2017-01-27 LAB — N-Telopeptide,random urine: N-TELOPEPTIDE,UR RANDOM: 22 nmol{BCE}/mmol{creat} (ref 20–275)

## 2017-01-27 NOTE — Telephone Encounter
Patient has been notified Tdap has been updated to her immunization record. Please close this encounter. Thanks

## 2017-01-29 ENCOUNTER — Ambulatory Visit: Payer: BLUE CROSS/BLUE SHIELD

## 2017-02-03 ENCOUNTER — Ambulatory Visit: Payer: BC Managed Care – POS

## 2017-02-04 ENCOUNTER — Ambulatory Visit: Payer: BC Managed Care – POS

## 2017-02-04 DIAGNOSIS — E213 Hyperparathyroidism, unspecified: Secondary | ICD-10-CM

## 2017-02-10 ENCOUNTER — Ambulatory Visit: Payer: PRIVATE HEALTH INSURANCE

## 2017-02-11 ENCOUNTER — Ambulatory Visit: Payer: BLUE CROSS/BLUE SHIELD

## 2017-02-11 ENCOUNTER — Inpatient Hospital Stay: Payer: BC Managed Care – POS | Attending: "Endocrinology

## 2017-02-11 ENCOUNTER — Ambulatory Visit: Payer: BC Managed Care – POS

## 2017-02-11 DIAGNOSIS — E041 Nontoxic single thyroid nodule: Secondary | ICD-10-CM

## 2017-02-13 ENCOUNTER — Ambulatory Visit: Payer: BC Managed Care – POS

## 2017-02-18 ENCOUNTER — Ambulatory Visit: Payer: BC Managed Care – POS

## 2017-02-25 ENCOUNTER — Ambulatory Visit: Payer: BLUE CROSS/BLUE SHIELD

## 2017-02-25 DIAGNOSIS — Z79890 Hormone replacement therapy (postmenopausal): Secondary | ICD-10-CM

## 2017-02-25 NOTE — Progress Notes
BREAST MEDICAL ONCOLOGY PROGRESS NOTE     Patient name: Robin Spencer  PCP: Pregler, Weyman Croon., MD  Referring provider: Cristopher Peru MD, P*      MRN: [id number]  DOB: [birth year]  Age: 66 y.o.      Reason for referral: breast cancer  Chief Complaint   Patient presents with   ??? Breast Cancer     History was obtained from the patient and medical records.  History of the Present Illness   Robin Spencer is a 66 y.o. post-menopausal female with ER+PR+HER2- (IHC) invasive ductal carcinoma of the left breast, s/p lumpectomy with sentinel lymph node excision 02/29/16, s/p adjuvant XRT 03/27/16-04/25/16, now on tamoxifen.     Robin Spencer presented with acute RIGHT breast pain. On 01/15/16 bilateral diagnostic breast ultrasound, she was found to have an irregular mass measuring 5x6x54mm at 12 o'clock, 6cm from the nipple in the LEFT breast. There was no axillary lymphadenopathy.    Robin Spencer underwent biopsy of the left breast mass on 01/22/16. On pathology, she was found to have an invasive ductal carcinoma, grade 1 with mBRS 5/9, in a background of DCIS (micropapillary and cribriform, no necrosis, low nuclear grade). ER+ (95% 3+), PR+ (40% 1-2+), HER2- (IHC 1+, FISH with HER2/CEN17 ratio 1.0), Ki67 1-2%.     Progress / Interval Events   02/29/16 left lumpectomy with sentinel lymph node biopsy. Invasive ductal carcinoma, grade 1 with mBRS 5/9, 2.8cm. ER+ (90%, 2-3+), PR+ (0%), HER2- (IHC 1+, HER2/CEP17 1.0 with 2.0 copies of HER2 per cell), Ki67 <1%. 2.1cm DCIS (clinging and micropapillary type with focal central necrosis. 0/2 lymph nodes involved. No LVI. pT2N0(i-).  03/27/16-04/18/16 Radiation therapy.   04/16/16 Presents to discuss adjuvant hormonal therapy. Started tamoxifen because continues on hormonal therapy.   08/13/16 Follow up on tamoxifen x3 months. Generally doing well except for hot flashes every 50 minutes. Vaginal atrophy much improved on DHEA. Some residual pain in the left breast. 12/11/16 mammogram + U/S, screening: no evidence of malignancy.  02/25/17 Tolerating tamoxifen well. Worried about her son, who is currently hospitalized for diarrheal illness.    Review of Systems   Constitutional: No fevers or chills. No weight loss or appetite changes.   Eyes: No recent blurry vision, double vision or visual changes.  ENT: No recent sinus pain or congestion. No sore throat or mouth sores.   Neck: No neck pain or stiffness.  Breasts: healed well. Breast pain, less than prior but persistent.  Cardiovascular: No chest pain or pressure, no palpitations. No swelling.  Pulmonary: No shortness of breath, no cough or wheezing.   Gastrointestinal: No abdominal pain. No nausea/vomiting. No diarrhea or constipation. No melena or BRBPR.   Genitourinary: No urinary frequency, urgency. + dysuria. + pain with sexual intercourse.  Musculoskeletal: No joint pain, muscle pain or back pain.   Neurologic: No headaches. No numbness, tingling or weakness.   Dermatologic: No rash, pruritus or recent skin changes.   Hematologic: No abnormal bruising or bleeding.   Endocrine: No polyuria, polydipsia, heat or cold intolerance. + worsening hot flashes and difficulty sleeping.  Psychiatric: No depressive symptoms. No suicidal or homicidal ideation.     Medical History Surgical History   Past Medical History:   Diagnosis Date   ??? Anxiety    ??? Eczema    ??? Fibroids    ??? GERD (gastroesophageal reflux disease)     esophagitis   ??? History of migraine headaches    ??? History of  radiation therapy 04/2016    L breast   ??? Otosclerosis    ??? Post-operative nausea and vomiting    ??? Rectal fissure    ??? Vitreous detachment     Past Surgical History:   Procedure Laterality Date   ??? BREAST BIOPSY  1986    Fibroadenoma, ductal hyperplasia   ??? Laparoscopy for subfertility     ??? Stapedectomy, right          Family History Social History   The family history includes Asthma in her mother; Depression in her father; Endometrial cancer (age of onset: 7) in her mother; Hyperlipidemia in her father; Hypertension in her father and mother; Hyperthyroidism in her mother; Prostate cancer (age of onset: 67) in her brother; Prostate cancer (age of onset: 26) in her father; Stroke in her father. The patient  reports that she has never smoked. She has never used smokeless tobacco. She reports that she does not drink alcohol or use drugs.   Ob/Gyn History    Obstetric History     No data available   Obstetric Comments   Menses started: 59   Gravida: 0   Para:  0   Age of first live birth: n/a    Breast feed: n/a    Contraceptives/Type:  Y, 18-30    Menopause reached at: 42   Hormone replacement/Type: Y, 50-64    Fertility Tx: Y    Occupational History   ??? Not on file.     Social History     Social History Narrative    Patient is married, and has two children. She is of Ashkenazi Agricultural engineer.        Work: Retired from [employer] relations        Exercise:        Diet:        Transfusion:        Religion:        Son on the autism spectrum, has bulimia.      Medications     Allergies   Allergen Reactions   ??? Penicillins Other (See Comments)     Ampicillin, flesh rash in 1980s   ??? Menadiol Sodium Diphosphate Rash   ??? Other      Red [place] coloring on meds such as advil- not allergic to actual med- just the coloring   ??? Acetaminophen Rash     Current Outpatient Prescriptions   Medication Sig   ??? acyclovir 400 mg tablet Take 1 tablet (400 mg total) by mouth two (2) times daily.   ??? BIOTIN PO Take 1 tablet by mouth daily .     ??? CALCIUM CITRATE PO Take 630 mg by mouth daily.   ??? Cholecalciferol (VITAMIN D) 1000 unit tablet Take 1,000 Units by mouth daily 200 units x5 days a wk     ??? cyanocobalamin 500 mcg tablet Take 500 mcg by mouth daily.   ??? estradiol (VAGIFEM) 10 mcg vaginal tablet Place 1 tablet (10 mcg total) vaginally two (2) times a week.   ??? MAGNESIUM CITRATE PO Take 150 mg by mouth two (2) times daily. ??? Omega-3 Fatty Acids (FISH OIL PO) Take 1,400 mg by mouth two (2) times daily.   ??? tamoxifen 20 mg tablet Take 1 tablet (20 mg total) by mouth daily.   ??? triamcinolone 0.5% cream Apply topically as needed for.   ??? UNABLE TO FIND Med Name: [place] supplement- Epimedium, Tumeric, Japanese, Fleeceflower, EPA/DHA .   ???  zoster vac recomb adjuvanted (SHINGRIX) 50 mcg injection 2nd of 2 Shingrix vaccinations now at pharmacy.     No current facility-administered medications for this visit.      Objective   BP 122/71  ~ Pulse 83  ~ Temp 36.8 ???C (98.3 ???F) (Oral)  ~ Resp 17  ~ Ht 163 cm  ~ Wt 56.2 kg (123 lb 14.4 oz)  ~ LMP  (LMP Unknown)  ~ SpO2 98%  ~ BMI 21.15 kg/m???    General: Appears well-developed, well-nourished and close to stated age.   Head: Normocephalic, atraumatic.  Eyes: PERRL without icterus.   ENT: Oropharynx is clear, mucus membranes are moist. No oral ulcers noted. Good dentition.  Neck: Supple. Trachea midline.   CV: Regular in rate. No edema.   Chest: Respiratory effort appears normal.   Breasts: breasts appear normal, no suspicious masses, no skin or nipple changes or axillary nodes. S/p left mastectomy, well-healed.  Abdomen: Soft, nontender and nondistended.   Musculoskeletal: No cyanosis. Extremities are warm and well-perfused.   Neurologic: Gait appears normal. Sensation intact to light touch in all four extremities. Oriented to person, place and time.   Hematologic: No bruising, purpura or petechiae are noted.   Dermatologic: No rashes appreciated.   Lymphatic: No palpable supraclavicular or infraclavicular adenopathy appreciated.  Psychiatric: Affect appropriate. Pleasant and conversant.   ECOG performance status: 0-Fully active, able to carry on all pre-disease performance without restriction  Laboratory   I have personally reviewed lab results.   Lab Results   Component Value Date    WBC 3.76 (L) 09/23/2016    HGB 13.1 09/23/2016    HCT 37.3 09/23/2016    MCV 99.7 (H) 09/23/2016 PLT 202 09/23/2016    CREAT 0.73 09/23/2016    BUN 15 09/23/2016    NA 141 09/23/2016    K 4.8 09/23/2016    CL 101 09/23/2016    CO2 24 09/23/2016    ALT 10 09/23/2016    AST 14 09/23/2016    ALKPHOS 56 09/23/2016    BILITOT 0.4 09/23/2016    ALBUMIN 4.5 09/23/2016       Pathology   I have personally reviewed all available pathology reports   03/04/16 left breast lumpectomy  FINAL DIAGNOSIS   Date Value Ref Range Status   02/29/2016   Final      A.  BREAST, LEFT (WIRE LOCALIZED LUMPECTOMY):  - Invasive ductal carcinoma, grade 1, 2.8 cm in greatest diameter (4/9 consecutive levels by slice method)   Modified Bloom Richardson Score: 5 of 9      Tubule formation: 2      Nuclear pleomorphism: 2      Mitotic score: 1  - In situ carcinoma (DCIS): present, 2.1 cm in greatest diameter (3/9 consecutive levels by slice method), clinging and micropapillary type with focal central necrosis  - Lumpectomy surgical margins: negative for invasive or in situ carcinoma (see B-G for final margin status)   Invasive ductal carcinoma:     Lateral margin: 7 mm    Inferior margin: 7 mm   Ductal Carcinoma in situ:     Lateral margin: 4 mm    Inferior margin: 2 mm  - Lymph/vascular invasion: not identified  - Microcalcifications: not identified  - Pathologic staging: p2 (sn)N0(i-) See synoptic report for case details  - Breast biomarkers: See Immunohistochemistry report below  B.  LEFT BREAST, ANTERIOR MARGIN (EXCISION):  - Fibroglandular breast tissue  - Negative for invasive or in situ malignancy    C.  LEFT BREAST, INFERIOR MARGIN (EXCISION):  - Fibroglandular breast tissue  - Negative for invasive or in situ malignancy    D.  LEFT BREAST, LATERAL MARGIN (EXCISION):  - Fibroglandular breast tissue - Negative for invasive or in situ malignancy    E.  LEFT BREAST, SUPERIOR MARGIN (EXCISION):  - Fibroglandular and fatty breast tissue  - Negative for invasive or in situ malignancy    F.  LEFT BREAST, MEDIAL MARGIN (EXCISION):  - Fibroglandular breast tissue  - Negative for invasive or in situ malignancy    G.  LEFT BREAST, DEEP MARGIN (EXCISION):  - Fibroadipose tissue  - Negative for invasive or in situ malignancy    H.  SENTINEL LYMPH NODES, LEFT AXILLA (LYMPHADENECTOMY):  - Two lymph nodes, negative for malignancy (0/2)        IHC REPORT   Date Value Ref Range Status   02/29/2016   Final    Breast Biomarkers: Block A6    RESULT ER/PR   ESTROGEN RECEPTORS PROGESTERONE RECEPTORS   Antibody Clone SP1 Clone 636   %Tumor Staining 90% 0%   Intensity (1+ to 3+) 2-3+ NA   Leica Bond III with Refine Polymer Detection System, using Heat retrieval for 20 minutes with pH6 buffer. Clone SP1 Diluted to 1/50 and PR636 to 1/200.    ESTROGEN/PROGESTERONE IMMUNOHISTOCHEMICAL REPORT  Using appropriate positive and negative controls, the test for the presence of these hormone receptor proteins is performed by the immunoperoxidase method, and reported according to the 2009 CAP-ASCO Guidelines for Hormone Receptor testing. Tissue is fixed from 6-72 hours in 10% neutral buffered formalin. A positive ER or PR tumor shows greater than or equal to 1 percent of cells staining, and results are semi-quantitated as indicated above.      RESULT: HER-2/neu IHC assay (utilizing FDA-approved DAKO Hercep Test): J. Clin. Oncol 2013; 1-18, and Arch Pathol Lab Med 0960:4-54.     Test Score:   1+    HER2/neu:  No overexpression    0 no staining observed or membrane staining that is incomplete and is faint/barely perceptible and within ? 10% of tumor cells No overexpression   1+ Incomplete membrane staining that is faint/barely perceptible and within > 10% of tumor cells No overexpression 2+ circumferential membrane staining that is incomplete, and/or weak/moderate within > 10% of tumor cells or complete and circumferential membrane staining that is intense and within ? 10% of tumor cells   Equivocal   3+ circumferential membrane staining that is complete, intense, and within > 10% of tumor cells Overexpression     Note: FISH gene amplification testing. Please see separate addendum report.    Ki-67: <1%    Note: The immunoperoxidase stains reported above for ER, PR, and Ki-67 were developed and their performance characteristics determined by Department of Pathology & Laboratory Medicine, [facility], [place]. They have not been cleared or approved by the U.S. Food and Drug Administration, although such approval is not required for analyte-specific reagents of this type.    GENERAL IMMUNOHISTOCHEMISTRY REPORT    BLOCK:  H1/H2  FIXATIVE:  Formalin    ANTIBODY/PROBE: RESULT/COMMENT  CK7   Negative  PanKer   Negative     INTERPRETATION:  Negative for isolated tumor cells    Note: The immunoperoxidase stain reported above was developed and its performance characteristics determined  by [facility].  It has not been cleared or approved by the U.S. Food and Drug Administration, although such approval is not required for analyte-specific reagents of this type.  Appropriate positive and negative controls are included for each case.         FISH REPORT   Date Value Ref Range Status   02/29/2016   Final    FINAL DIAGNOSIS: ERBB2 (HER2) FISH:  Negative for HER2 gene amplification     HER2/CEP17 ratio: 1.0   Average HER2 copy number per cell: 2.0     KARYOTYPE:    nuc ish(D17Z1,ERBB2)x2[20]      INTERPRETATION:  FISH analysis with the dual color probes specific for the centromere of chromosome 17 and the ERBB2 (HER2) gene (17q11.2) (multiplex1) was performed and examined by two independent technologists.  These studies showed no evidence of HER2 amplification in the 20 nuclei analyzed, per the FDA-approved criteria (package insert).    Average HER2 copy number: 2.0 signals/cell    D17Z1: Green signals 40  HER-2: Red signals  40  Ratio:  R/G   1.0      AMPLIFIED:  Dual-probe HER2/CEP17 ratio ? 2.0 with an average HER2 copy number ? 4.0 signals per cell   Dual-probe HER2/CEP17 ratio ? 2.0 with an average HER2 copy number < 4.0 signals/cell  Dual-probe HER2/CEP17 ratio < 2.0 with an average HER2 copy number ? 6.0 signals/cell    NOT AMPLIFIED: Dual-probe HER2/CEP17 ratio < 2.0 with an average HER2 copy number < 4.0 signals/cell    EQUIVOCAL: Dual-probe HER2/CEP17 ratio < 2.0 with an average HER2 copy number ? 4.0 and < 6.0 signals/cell    INDETERMINATE: NO FISH results due to technical issues (Inadequate specimen handling; Artifacts; Analytic testing failure); Recommended to send another specimen for testing to determine HER2 status.    References: Veronia Beets al, ASCO/CAP HER2 Testing Guideline Update ??? 2013 College of American Pathologists    Note: The PathVysion HER2 DNA Probe Kit (PathVysion Kit) which is FDA approved is designed to detect amplification of the HER-2/neu gene via fluorescence in situ hybridization (FISH) in formalin-fixed, paraffin-embedded human breast cancer tissue specimens. The nuclei counted in these studies were within the region selected by a pathologist.  Specimens were fixed in 10% neutral buffered formalin for 6-72 hours. The FISH test was developed and its performance characteristics determined by [facility].       01/22/16 core biopsy [[facility]]  BREAST, LEFT, 12:00???(NEEDLE CORE BIOPSY):  - Invasive ductal carcinoma, Grade 1???(50% of biopsy, longest involved segment spans 0.5 cm)  ?????????????????????Modified Bloom???and???Richardson Score: 5 of 9  ???????????????????????????????????????Tubule formation:????????????????????????????????????????????????2  ???????????????????????????????????????Nuclear pleomorphism:?????????????????????2  ???????????????????????????????????????Mitotic score:?????????????????????????????????????????????????????????????????????1 (1/10 HPF, 0.55 mm field diameter)  - Ductal carcinoma in situ (DCIS), micropapillary and cribriform, without central necrosis, low nuclear grade  - Lymph/vascular invasion: Not identified  - Microcalcifications: Not identified   - Breast biomarkers: See report below  - HER2/neu FISH:???Pending???and will be reported separately  RESULT ER/PR  ??? ESTROGEN RECEPTORS PROGESTERONE RECEPTORS   Antibody                Clone SP1               Clone 636   %Tumor Staining 95% 40%   Intensity (1+ to 3+) 3+ 1-2+   HER-2/neu IHC assay (utilizing FDA-approved DAKO Hercep Test): J. Clin. Oncol 2013; 1-18, and Arch Pathol Lab Med 1610:9-60.   ???  Test Score:   1+  ???  HER2/neu: No overexpression  Ki-67: 1-2%    Imaging   I independently reviewed imaging.   12/11/16 bilateral breast tomo + U/S screening  MAMMOGRAM FINDINGS:     The breast tissue is extremely dense, which lowers the sensitivity of mammography (density D).     There are post-surgical changes seen in the left breast at 12 o'clock, upper outer quadrant.     No suspicious masses, calcifications or other abnormalities are seen in either breast.     No significant interval change is identified.     ULTRASOUND FINDINGS:     Whole breast and axillary sonography was performed in both breasts.     Ultrasound demonstrates a scar seen in the left breast at 12 o'clock, upper outer quadrant.     There is no evidence of any suspicious solid mass or abnormal cystic elements.     In the right breast, there is no evidence of any suspicious solid mass or other abnormality.     No axillary lymphadenopathy.     IMPRESSION:     ???  There is no mammographic or sonographic evidence of malignancy.     Routine screening mammogram in 1 year is recommended.     BI-RADS Category 2:        Benign Finding(s)    01/15/2016 Bilateral diagnostic breast ultrasound  RIGHT DIAGNOSTIC BREAST ULTRASOUND AND LEFT DIAGNOSTIC BREAST ULTRASOUND   ULTRASOUND FINDINGS: Finding 1:  Targeted breast and axillary sonography was performed in the left breast in the area of the pain in the upper outer quadrant. There is an irregular not parallel vascular irregular mass with indistinct margins measuring 5 x 6 x 4 mm seen in the left breast at 12 o'clock located 6 centimeters from the nipple. Internal echotexture is hypoechoic. There are decreased posterior echoes; edge shadows are excluded. This mass is seen along the medial border of the left breast scar.    Finding 2:  Targeted breast and axillary sonography was performed in the right breast in the area of the pain. There is no sonographic correlate in the area of the pain in the lower outer quadrant of the right breast.  No axillary lymphadenopathy.    IMPRESSION:    Finding 1:  Irregular mass in the upper outer quadrant of the left breast is suspicious. An ultrasound guided biopsy is recommended. Although this structure may represent post-surgical change/scar, tissue diagnosis is suggested.  Findings and recommendations were discussed with the patient following the procedure, and she is amenable to biopsy. The recommendation for biopsy was also communicated to ordering provider Anselm Jungling via email.  Finding 2:  There is no evidence of malignancy.                                                                                 11/03/11 DEXA [[facility]]  PA Spine:       The average bone mineral density from L1-4 is 0.787 g/cm2, 2.4 standard deviations below the mean for normal young adults (*T-scores) or 75% of normal. Compared to age and sex-matched controls (**Z-scores), this density is 0.9 standard deviations   below the mean.  ???  Total Hip:  The bone mineral density in the left total hip is 0.706 g/cm2, 1.9 standard deviations below the mean for normal young adults (*T-scores) or 75% of normal.  Compared to age and sex-matched controls (**Z-scores), this density is 1.0 standard   deviations below the mean.  ???  Femoral Neck: The bone mineral density in the left femoral neck is 0.531 g/cm2, 2.9 standard deviations below the mean for normal young adults (*T-scores) or 63% of normal.  Compared to age and sex-matched controls (**Z-scores), this density is 1.6 standard   deviations below the mean.  ???  *T-score compares the BMD of an individual to peak bone mass and expresses the difference as a standard deviation score,**Z-score compares the BMD of an individual with age-matched, gender-matched, and race-matched controls and expresses the difference   as a standard deviation score.  ???  IMPRESSION:  1.  This is a baseline study at the [facility].  2.  The patient has osteoporosis.    Assessment & Recommendations   Robin Spencer was seen in Multidisciplinary Breast Clinic with NP Samuel Jester, Surgical Oncologist Dr William Hamburger, Radiation Oncologist Dr Hilaria Ota, and [facility]ms[facility].    Robin Spencer is a 66 y.o. post-menopausal female with ER+PR+HER2- (IHC) invasive ductal carcinoma of the left breast s/p lumpectomy with sentinel lymph node excision 02/29/16, s/p adjuvant XRT 03/27/16-04/25/16, now on tamoxifen.      ICD-9-CM ICD-10-CM    1. Invasive ductal carcinoma of left breast, ER+PR+HER2- 174.9 C50.912    2. On hormone replacement therapy V07.4 Z79.890      1. ER+PR+HER2- invasive ductal carcinoma in a menopausal woman. Stage IIA (pT2N0) based on pathological staging. I discussed an aromatase inhibitor x5 years vs tamoxifen x5-10 years. Side effects of aromatase inhibitors include loss of bone density, menopausal symptoms such as hot flashes, and joint stiffness/muscle aches. The side effects of tamoxifen include increased risk of blood clots, a low risk of endometrial cancer, and interference with medications cleared through the cytochrome P450 pathway in the liver (including the first generation SSRIs and coumadin).   - s/p Lumpectomy with sentinel lymph node biopsy with Dr Francella Solian 02/29/16. - s/p radiation therapy 03/27/16-04/25/16.   - Robin Spencer and I discussed starting tamoxifen x5-10 years instead of an aromatase inhibitor for the following reasons: 1) she's on low-dose HRT (DHEA, which is converted to estrogen) for vaginal atrophy and 2) she has osteoporosis. We discussed the risk of uterine cancer (04/998) in particular because Robin Spencer's mother had uterine cancer. Based on family history, I don't have a suspicion for Lynch syndrome.   - due for mammogram 11/2017    2. Osteoporosis.  DEXA 04/17/16 showed osteoporosis of the lumbar spine.  - Baseline DEXA scan prior to aromatase inhibitor, then q2 years. Currently she's on tamoxifen, so her bone density should increase.  - I recommend VitD 2000 U/day  - recommend calcium 1200mg /day in divided doses  - weight bearing exercise    3. Vaginal atrophy.   - As long as she's on tamoxifen, I am not opposed to intravaginal estrogen. Often gyn uses higher dose estrogen initially to build up the lining of the vagina, then tapers off to a maintenance dose.  - provided list of moisturizers and lubricants to try.     4. Genetic counseling and testing. Does not meet criteria for genetic testing.     Return to Clinic: 6 months, sooner prn    The patient is in agreement with the plan  above. The patient and family's questions were answered to their satisfaction. They know how to contact us if they have additional questions or concerns. More than 50% of this 30 minute encounter was spent in face-to-face counseling regarding cancer education, tamoxifen vs AI, prognosis, vaginal atrophy. The patient was provided with notes summarizing our conversation as documented in the After Visit Summary.       Evonnie Dawes, MD, PhD  [facility]   [facility] Hematology/Oncology  [email or web address]  Pager [phone]    [facility]  [address] [place], [place] [address]  Phone [phone], Fax [phone] Appts: Wednesday 1-5PM, Friday 8-5PM    [facility]On[facility]   [address]  [place], [place], [address]  Phone [phone], Fax [phone]  Appts: Thursday 8-5PM

## 2017-02-28 DIAGNOSIS — Z79810 Long term (current) use of selective estrogen receptor modulators (SERMs): Secondary | ICD-10-CM

## 2017-03-23 ENCOUNTER — Ambulatory Visit: Payer: MEDICARE

## 2017-03-23 MED ORDER — TAMOXIFEN CITRATE 20 MG PO TABS
20 mg | ORAL_TABLET | Freq: Every day | ORAL | 1 refills | Status: AC
Start: 2017-03-23 — End: 2017-03-24

## 2017-03-23 MED ORDER — ACYCLOVIR 400 MG PO TABS
400 mg | ORAL_TABLET | Freq: Two times a day (BID) | ORAL | 1 refills | Status: AC
Start: 2017-03-23 — End: 2017-10-17

## 2017-03-23 MED ORDER — TAMOXIFEN CITRATE 20 MG PO TABS
20 mg | ORAL_TABLET | Freq: Every day | ORAL | 3 refills | Status: AC
Start: 2017-03-23 — End: 2018-06-15

## 2017-04-21 ENCOUNTER — Ambulatory Visit: Payer: BC Managed Care – POS | Attending: Rheumatology

## 2017-04-27 DIAGNOSIS — Z23 Encounter for immunization: Secondary | ICD-10-CM

## 2017-05-08 ENCOUNTER — Ambulatory Visit: Payer: MEDICARE

## 2017-05-08 DIAGNOSIS — M810 Age-related osteoporosis without current pathological fracture: Secondary | ICD-10-CM

## 2017-05-08 DIAGNOSIS — E349 Endocrine disorder, unspecified: Secondary | ICD-10-CM

## 2017-05-08 DIAGNOSIS — A6000 Herpesviral infection of urogenital system, unspecified: Secondary | ICD-10-CM

## 2017-05-08 DIAGNOSIS — R7989 Other specified abnormal findings of blood chemistry: Secondary | ICD-10-CM

## 2017-05-08 DIAGNOSIS — H9191 Unspecified hearing loss, right ear: Secondary | ICD-10-CM

## 2017-05-08 NOTE — H&P
PATIENT:  Robin Spencer  MRN:  [id number]  DOB:  [birth year]  DATE OF SERVICE:  05/08/2017    PRIMARY CARE PROVIDER: Destany Severns, Weyman Croon., MD    Cc: Welcome to [employer] Initial Preventive Physical Examination OR Follow up Annual Wellness Visit Follow up and Follow up of Chronic Conditions  Subjective:     Robin Spencer is a a 67 y.o. female who presents for Welcome to Medicare Initial Preventive Physical Examination or Annual Wellness Visit Follow up  and discussion and follow up of chronic conditions as listed below.    Note: This is a one time examination performed within 12 months of the first effective date of Medicare Part B coverage.  [id number]; or as a follow up 12 months after the last Annual Wellness Exam [phone].    1. History of breast cancer:  On TAM.     2.  Osteoporosis: On TAM.  Will check Vitamin D. Fell by a ''fluke'' while hiking.  Hopped up onto high sidewalk.      3.  Genital herpes:  No recent outbreak.     4.  Hearing loss: Uses hearing aids.     5. Elevated PTH: Followed by Dr. Selena Batten.     Other MDs seen in continuity:  Dr. Maye Hides (oncology), Dr. Ladona Ridgel (rheumatology), Dr. Selena Batten (endocrinology), Dr. Hortencia Pilar (ophthalmology) .     Patient Active Problem List    Diagnosis Date Noted   ??? Increased PTH level 05/08/2017     Sees Dr. Selena Batten in endocrinology.      ??? Syncope and collapse 06/25/2016     2018, ECG/Labs/MRI w/o gad unrevealing, saw neurology Dr. Lorina Rabon, plan; EEG, followup      ??? Immune to measles 05/09/2016     (+) Ab 2018     ??? Invasive ductal carcinoma of left breast, ER+PR+HER2- 01/29/2016     2017, Bilateral tomosynthesis screening mammogram on 12/11/2015. There were no suspicious findings seen. She had postsurgical changes in both breasts. Patient then developed bilateral breast pain, left greater than right. She was sent for bilateral diagnostic ultrasounds on 01/15/2016. The right breast was negative. However, in the left breast, there was an irregular mass seen with indistinct margins, measuring 5 x 6 x 4 mm, at 12 o'clock, 6 cm from the nipple. Her left axilla was negative. Genetic testing: NO MUTATION DETECTED/This panel tests three Founder Mutations in the BRCA1/2 genes (BRCA1 187delAG, BRCA1 5385insC, BRCA2 6174delT; lumpectomy: sentinal node negative x 2.  TAM recommended by oncology, annual endometrial surveillance given history by GYN.      ??? Breast pain 01/09/2016     [facility] plan per NP, imaging, lifestyle changes     ??? Cataract, nuclear sclerotic, both eyes 11/29/2015     Not yet visually significant. Continue observation.      ??? Status post LASIK surgery of both eyes 11/29/2015     Performed by Dr. Ellin Goodie (2000 ?). Originally set with left eye for distance and low myopia for right, but now near emmetropia both eyes.     ??? Fibroid 04/09/2015     2017 on ultrasound.      ??? BPPV (benign paroxysmal positional vertigo) 07/19/2013   ??? Menopause 04/14/2013   ??? Seborrheic dermatitis 05/11/2012   ??? 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 PPD positive 04/07/2012   ??? Palpitations 04/07/2012     2009 ER PACs, PVCs, sinus arrythmia, ECHO, Stress ECHO WNL 2009     ??? OA (  osteoarthritis) 04/07/2012     Hands 2011, anti CCP neg, no evidence inflammation     ??? Visit for preventive health examination 04/07/2012     Colo noscopy= hyperplastic polyps 2017     ??? Hearing loss, right 04/07/2012     Otosclerosis, s/p stapedectomy     ??? Eye allergies 01/08/2012     Using ketotifen as needed but could consider using on a more regular basis. May also use artificial tears as needed.     ??? Osteoporosis 11/10/2011     DEXA 2013, spine-2.4, femoral neck -2.9. On HT for menopause sx and as osteoporosis rx.; PA lumbar spine T-score of -2.7, will begin TAM per oncology.            Allergies   Allergen Reactions   ??? Penicillins Other (See Comments) and Rash     Ampicillin, flesh rash in 1980s   ??? Menadiol Sodium Diphosphate Rash   ??? Other [place] coloring on meds such as advil- not allergic to actual med- just the coloring   ??? Acetaminophen Rash       Medications that the patient states to be currently taking   Medication Sig   ??? ACYCLOVIR 400 mg tablet TAKE 1 TABLET (400 MG TOTAL) BY MOUTH TWO (2) TIMES DAILY.   ??? BIOTIN PO Take 1 tablet by mouth daily .     ??? CALCIUM CITRATE PO Take 630 mg by mouth daily.   ??? Cholecalciferol (VITAMIN D) 1000 unit tablet Take 1,000 Units by mouth daily 200 units x5 days a wk     ??? cyanocobalamin 500 mcg tablet Take 500 mcg by mouth daily.   ??? estradiol (VAGIFEM) 10 mcg vaginal tablet Place 1 tablet (10 mcg total) vaginally two (2) times a week.   ??? MAGNESIUM CITRATE PO Take 150 mg by mouth two (2) times daily.   ??? Omega-3 Fatty Acids (FISH OIL PO) Take 1,400 mg by mouth two (2) times daily.   ??? tamoxifen 20 mg tablet Take 1 tablet (20 mg total) by mouth daily.   ??? triamcinolone 0.5% cream Apply topically as needed for.   ??? UNABLE TO FIND Med Name: [place] supplement- Epimedium, Tumeric, Japanese, Fleeceflower, EPA/DHA .         Past Medical History:   Diagnosis Date   ??? Anxiety    ??? Eczema    ??? Fibroids    ??? GERD (gastroesophageal reflux disease)     esophagitis   ??? History of migraine headaches    ??? History of radiation therapy 04/2016    L breast   ??? Otosclerosis    ??? Post-operative nausea and vomiting    ??? Rectal fissure    ??? Vitreous detachment        Past Surgical History:   Procedure Laterality Date   ??? BREAST BIOPSY  1986    Fibroadenoma, ductal hyperplasia   ??? Laparoscopy for subfertility     ??? Stapedectomy, right         Family History   Problem Relation Age of Onset   ??? Hypertension Father    ??? Stroke Father    ??? Hyperlipidemia Father    ??? Depression Father    ??? Prostate cancer Father 39   ??? Hypertension Mother    ??? Asthma Mother    ??? Endometrial cancer Mother 84   ??? Hyperthyroidism Mother    ??? Prostate cancer Brother 40   ??? Breast cancer Neg Hx    ???  Ovarian cancer Neg Hx ??? Malignant hypertension Neg Hx        Amoreena Neubert  reports that she has never smoked. She has never used smokeless tobacco. She reports that she does not drink alcohol or use drugs. Doing Ballroom dancing for exercise, exercises 3 times a week.     Diet: Eats a lot of greens.     Physical Activities, including limitations:  No limits.     Self assessment of health: Good.     Social support: Husband, girlfriends, book club.     Activities of Daily Living (note any not independent):   bathing: personal hygiene and grooming; dressing: dressing and undressing; transferring: movement and mobility; toileting: continence-related tasks including control and hygiene; eating: preparing food and feeding    Instrumental Activities of Daily Living (note any not independent): cooking, cleaning, finances, shopping, healthcare and medication, using telephones and technology, transportation, and caring for other individuals and pets.    Fall risk:     (1) Falls in the past year? Yes.   (2) Injury from fall in the past year? Yes, hit head, not severe.   (3) Feels unsteady when standing or walking? No  (4) Worried about falling?  No    Mechanism was ''hopped on wall''.      Home Safety:      (1) Are you worried about clutter in your home? No.   (2) Do you have poor lighting by day or night in your home? No.   (3) Are there slippery surfaces in your home? No.   (4) Are you worried about falling when using your toilet, bath or shower facilities? No.         Goals of Care/DPA: Discussed, patient will consider.       Immunization History   Administered Date(s) Administered   ??? Hepatitis A, Adult 12/10/2006, 04/24/2016   ??? Hepatitis A, unspecified formulation 12/10/2006   ??? Tdap 04/26/2010, 01/24/2017   ??? Typhoid Inactivated, ViCPs 04/24/2016   ??? influenza vaccine IM quadrivalent (Fluzone Quad) (PF) SYR/SDV (34 years of age and older) 02/14/2014   ??? influenza vaccine IM trivalent high dose (Fluzone High Dose) (PF) SYR (22 years of age and older) 01/16/2017   ??? influenza, unspecified formulation 02/01/1999, 01/22/2000, 01/28/2002, 02/14/2014, 02/01/2016   ??? zoster live vaccine (Zostavax) 04/15/2012   ??? zoster vac recomb adjuvanted (Shingrix) 06/26/2016         Health Maintenance   Topic Date Due   ??? Pneumococcal Vaccine (1 of 2 - PCV13) 08/11/2016   ??? Shingles (Shingrix) Vaccine (2 of 2) 08/21/2016   ??? Annual Preventive Wellness Visit  05/08/2017   ??? Breast Ca Screening: MAMMOGRAM  12/11/2017   ??? Cervical Ca Screening: HPV Testing  04/08/2020   ??? Cervical Ca Screening: PAP Smear  04/08/2020   ??? Colon Ca Screening: COLONOSCOPY  10/07/2025   ??? Tdap/Td Vaccine (3 - Td) 01/25/2027   ??? Influenza Vaccine  Completed   ??? Shingles (Zostavax) Vaccine  Completed   ??? Osteoporosis Early Detection DEXA Scan  Completed   ??? Hepatitis C Screening  Completed   ??? HIV Screening  Completed         Review of Systems:     Depression screen:    (1) During the past month, have you been bothered by feeling down, depressed or helpless? No.   (2) During the past month, have you often been bothered by little interest or pleasure in doing things? No.  Hearing: Good with hearing aids.     Vision: No issues, will see ophtho for check.     Const:  No significant weight gain or loss  Cor:  No exertional chest pain.  Pulm:  No exertional dyspnea.  Endo:  Postmenopausal. Hot flushes imporved.  GU:  No vaginal spotting.  MS: Joint pains improving with PT .  Skin:  No irregular or growing skin lesions.         Objective:  PE:  Const:  BP 109/65 (BP Location: Left arm, Patient Position: Sitting, Cuff Size: Regular)  ~ Pulse 80  ~ Temp 36.9 ???C (98.4 ???F) (Oral)  ~ Resp 17  ~ Ht 5' 4'' (1.626 m)  ~ Wt 127 lb (57.6 kg)  ~ LMP  (Exact Date)  ~ SpO2 100%  ~ Breastfeeding? No  ~ BMI 21.80 kg/m???    NAD.   ENT:  OP clear.  Teeth intact.  Neck:  Normal appearance.  No thyromegaly or masses.  Pulm:  Normal respiratory effort.  Normal auscultation.  Cor: RRRS1S2 without m,r,g.  No peripheral edema.  Breasts: Normal in appearance.  No masses.  Lymph:  No cervical or axillary adenopathy.  Abdomen:  Soft, nontender. No HSM.  Skin:  Warm, no rashes.  Cherry angiomata. Psych:  Alert and oriented to person, place, time, and situation.  Affect appropriate.  Judgement appears intact.         LABS:  Lab Results   Component Value Date    WBC 3.76 (L) 09/23/2016    HGB 13.1 09/23/2016    HCT 37.3 09/23/2016    MCV 99.7 (H) 09/23/2016    PLT 202 09/23/2016     Lab Results   Component Value Date    CREAT 0.73 09/23/2016    BUN 15 09/23/2016    NA 141 09/23/2016    K 4.8 09/23/2016    CL 101 09/23/2016    CO2 24 09/23/2016     Lab Results   Component Value Date    ALT 10 09/23/2016    AST 14 09/23/2016    ALKPHOS 56 09/23/2016    BILITOT 0.4 09/23/2016     Lab Results   Component Value Date    CHOL 246 05/08/2016    CHOLDLCAL 144 (H) 04/20/2014    TRIGLY 99 04/20/2014     Lab Results   Component Value Date    TSH 1.5 01/22/2017             Assessment & Plan:     Assessment:   1. History of breast cancer:  On TAM.     2.  Osteoporosis: On TAM.  Will check Vitamin D. Fell by a ''fluke'' while hiking.  Hopped up onto high sidewalk.      3.  Genital herpes:  No recent outbreak.     4.  Hearing loss: Uses hearing aids.     5. Elevated PTH: Followed by Dr. Selena Batten.     Annual Preventive Exam  Patient Active Problem List   Diagnosis   ??? Osteoporosis   ??? Eye allergies   ??? Genital HSV   ??? PPD positive   ??? Palpitations   ??? OA (osteoarthritis)   ??? Visit for preventive health examination   ??? Hearing loss, right   ??? Seborrheic dermatitis   ??? Menopause   ??? BPPV (benign paroxysmal positional vertigo)   ??? Fibroid   ??? Cataract, nuclear sclerotic, both eyes   ??? Status post LASIK surgery  of both eyes   ??? Breast pain   ??? Invasive ductal carcinoma of left breast, ER+PR+HER2-   ??? Immune to measles   ??? Syncope and collapse   ??? Increased PTH level     Health Maintenance   Topic Date Due ??? Pneumococcal Vaccine (1 of 2 - PCV13) 08/11/2016   ??? Shingles (Shingrix) Vaccine (2 of 2) 08/21/2016   ??? Annual Preventive Wellness Visit  05/08/2017   ??? Breast Ca Screening: MAMMOGRAM  12/11/2017   ??? Cervical Ca Screening: HPV Testing  04/08/2020   ??? Cervical Ca Screening: PAP Smear  04/08/2020   ??? Colon Ca Screening: COLONOSCOPY  10/07/2025   ??? Tdap/Td Vaccine (3 - Td) 01/25/2027   ??? Influenza Vaccine  Completed   ??? Shingles (Zostavax) Vaccine  Completed   ??? Osteoporosis Early Detection DEXA Scan  Completed   ??? Hepatitis C Screening  Completed   ??? HIV Screening  Completed         Personalized Prevention Plan Services and  Management of Medical Conditions:     Health Maintenance   Topic Date Due   ??? Pneumococcal Vaccine (1 of 2 - PCV13) 08/11/2016   ??? Shingles (Shingrix) Vaccine (2 of 2) 08/21/2016   ??? Annual Preventive Wellness Visit  05/08/2017   ??? Breast Ca Screening: MAMMOGRAM  12/11/2017   ??? Cervical Ca Screening: HPV Testing  04/08/2020   ??? Cervical Ca Screening: PAP Smear  04/08/2020   ??? Colon Ca Screening: COLONOSCOPY  10/07/2025   ??? Tdap/Td Vaccine (3 - Td) 01/25/2027   ??? Influenza Vaccine  Completed   ??? Shingles (Zostavax) Vaccine  Completed   ??? Osteoporosis Early Detection DEXA Scan  Completed   ??? Hepatitis C Screening  Completed   ??? HIV Screening  Completed     Orders Placed This Encounter   ??? Pneumococcal Conjugate Vaccine 13-Valent IM (Prevnar 13) - ACIP recommended >=19 immunocompromised, functional or anatomic asplenia, CSF leaks, cochlear implants, and >=65 one time. For patients 65 and order, the interval between administration of PCV13/Prevnar and PPSV23/Pneumovax be one year regardless of which vaccine was given first.   ??? Vitamin D,25-Hydroxy   ??? TSH   ??? Comprehensive Metabolic Panel   ??? Chol,LDL,Quant   ??? Cholesterol   ??? Cholesterol, HDL     Personalized Health Advice (if applicable, includes referrals to health education or preventive counseling services or programs): Patient Instructions   (1) Please send a copy of your Durable Power of Attorney/Advanced Heathcare Planning Form to our office so we can put it in our computer system.  This will make it available to all your physicians, the Emergency Department, and our hospitals, if needed.     (2) There is a new, more effective shingles vaccine.  Because you have Medicare, it is less costly for you to get it at a pharmacy.  It is called ''Shingrix''.  I recommend you get a shot, and a second one in 6 months.  You should have this even if you had the ''old'' shingles shot, or if you have had shingles.  Currently, there is a shortage of the vaccine.  I recommend you look for it starting Spring, 2019        Return in about 1 year (around 05/08/2018) for Annual Wellness- 30 minutes.        The above plan of care, diagnosis, order, and follow-up were discussed with the patient. Questions related to this recommended plan of care were answered.  Verbal or written information was provided to the patient about end of life planning/advanced care planning.     The services and diagnoses listed above in addition to the Medicare Annual Visit constituted separate and distinct services and diagnoses that were addressed and discussed with the patient today.  Author:  Weyman Croon. Sadiya Durand 05/08/2017 2:49 PM

## 2017-05-08 NOTE — Patient Instructions
(  1) Please send a copy of your Durable Power of Attorney/Advanced Heathcare Planning Form to our office so we can put it in our computer system.  This will make it available to all your physicians, the Emergency Department, and our hospitals, if needed.      (2)

## 2017-05-19 ENCOUNTER — Ambulatory Visit: Payer: MEDICARE

## 2017-06-02 ENCOUNTER — Ambulatory Visit: Payer: MEDICARE

## 2017-06-02 NOTE — Telephone Encounter
FYI Dr. Lysbeth Penner

## 2017-06-11 ENCOUNTER — Ambulatory Visit: Payer: BC Managed Care – POS

## 2017-06-11 DIAGNOSIS — H43393 Other vitreous opacities, bilateral: Secondary | ICD-10-CM

## 2017-06-11 DIAGNOSIS — H1013 Acute atopic conjunctivitis, bilateral: Secondary | ICD-10-CM

## 2017-06-11 NOTE — Progress Notes
Assessment and Plan     Problem List        Eye / Vision Problems    1. Eye allergies     Overview      Not having significant symptoms at this time, but could use over the   counter anti-histamine eye drops, like ketotifen or artificial tears as   needed if symptoms recur.         2. Cataract, nuclear sclerotic, both eyes     Overview      Not yet visually significant. Continue observation.          3. Status post LASIK surgery of both eyes     Overview      Performed by Dr. Corinda Gubler (2000 ?). Originally set with left eye for   distance and low myopia for right.         4. Vitreous floater, bilateral     Overview      No retinal detachment, retinal tear, or vitreous hemorrhage on dilated   fundus exam. Signs and symptoms reviewed.                  Orders:  No orders of the defined types were placed in this encounter.      No outpatient prescriptions have been marked as taking for the 06/11/17 encounter (Office Visit) with Trudee Kuster., MD.        Follow ups:  Return in about 2 years (around 06/12/2019) for Dilated fundus exam.       I discussed the above assessment and plan with the patient. She had the opportunity to ask questions, and her questions and concerns were addressed.     Author:   Trudee Kuster, MD  This note details the assessment and plan of the encounter for this date. For a complete note and record of the encounter, please see the Encounter Summary.

## 2017-06-15 ENCOUNTER — Ambulatory Visit: Payer: BLUE CROSS/BLUE SHIELD | Attending: Rheumatology

## 2017-06-15 DIAGNOSIS — M797 Fibromyalgia: Secondary | ICD-10-CM

## 2017-06-15 DIAGNOSIS — M19042 Primary osteoarthritis, left hand: Secondary | ICD-10-CM

## 2017-06-15 DIAGNOSIS — M19041 Primary osteoarthritis, right hand: Secondary | ICD-10-CM

## 2017-06-15 NOTE — Patient Instructions
- Consider low dose naltrexone (LDN) for fibromyalgia treatment.   Benefits and risks of LDN discussed with patient.   Patient to read about naltrexone.  Patient is aware they cannot take LDN while consuming narcotics or alcohol and  they must be willing to discontinue these substances for at least 2 weeks in order to try LDN.     Patient to let me know if she is interested in starting LDN and then I will check with our pharmacist for drug-drug interactions between LDN and patient's current medications.     When given the okay by me or our pharmacist, patient to start LDN 1.5 mg per day and increase as tolerated to 4.5 mg per day over the course of 1 month.     If patient has a medical thyroid condition, thyroid blood tests must be monitored while on LDN.     If we proceed with LDN, we will send prescription to:  [facility]  [address]  [address]  [place], [place] [address]  Phone: [phone]  LDN prescription can be picked up or mailed to patient.    - Consider referral to orthopedics if L finger mucoid cyst becomes bothersome    - Continue PT as needed                        ???  - Follow with Dr. Lorenza Chick for vitamin D supplementation recommendations  ??????  - Continue magnesium citrate 250-300 mg daily and calcium citrate 500 mg in combination with 250 mg daily.  ???  - Continue turmeric, ginger, and fish oil supplementation   ???   - Continue OTC lemon balm tea to help with tension and stress     - 2018 EULAR recommendations for hands osteoarthritis:   Chondroitin sulfate may be used in patients with hand OA for pain relief and improvement in functioning.   Reference: Ayesha Rumpf, et al. 2018 update of the EULAR recommendations for the management of hand osteoarthritis [published online November 11, 2016]. Ann Rheum Dis. doi:10.1136/annrheumdis-2018-213826    RTC 6 months PRN      - Additional LDN Information:  What dosage and frequency should my physician prescribe? The usual adult dosage is 4.5mg  taken once daily at night. Because of the rhythms of the body's production of master hormones, LDN is best taken between 9pm and 3am. Most patients take it at bedtime.    Notable exceptions:    People who have multiple sclerosis that has led to muscle spasms are advised to use only 3mg  daily and to maintain that dosage.  For intial dosage of LDN in those patients who have Hashimoto???s thyroiditis with hypothyroidism and who are taking thyroid hormone replacement medication, please read Cautionary Warnings, below.  Rarely, the naltrexone may need to be purchased as a solution ??? in distilled water ??? with 1mg  per ml dispensed with a 5ml medicine dropper. If LDN is used in a liquid form, it is important to keep it refrigerated.    The therapeutic dosage range for LDN is from 1.5mg  to 4.5mg  every night. Dosages below this range are likely to have no effect at all, and dosages above this range are likely to block endorphins for too long a period of time and interfere with its effectiveness.    > IMPORTANT: Make sure to specify that you do NOT want LDN in a slow-release form (see above).    Are  there any side effects or cautionary warnings?    > Side effects:    LDN has virtually no side effects. Occasionally, during the first week's use of LDN, patients may complain of some difficulty sleeping. This rarely persists after the first week. Should it do so, dosage can be reduced from 4.5mg  to 3mg  nightly.    > Cautionary warnings:    Because LDN blocks opioid receptors throughout the body for three or four hours, people using medicine that is an opioid agonist, i.e. narcotic medication ??? such as Ultram (tramadol), morphine, dextromethorphan, Percocet, Duragesic patch or codeine-containing medication ??? should not take LDN until such medicine is completely out of one's system. Patients who have become dependant on daily use of narcotic-containing pain medication may require 10 days to 2 weeks of slowly weaning off of such drugs entirely (while first substituting full doses of non-narcotic pain medications) before being able to begin LDN safely.  LDN users who are planning to have surgery performed generally discontinue taking LDN for one or two days prior to the scheduled procedure. They then are able to restart it promptly after surgery, when they no longer need to take narcotics regularly.  Those patients who are taking thyroid hormone replacement for a diagnosis of Hashimoto???s thyroiditis with hypothyroidism ought to begin LDN at the lowest range (1.5mg  for an adult). Be aware that LDN may lead to a prompt decrease in the autoimmune disorder, which then may require a rapid reduction in the dose of thyroid hormone replacement in order to avoid symptoms of hyperthyroidism.  Full-dose naltrexone (50mg ) carries a cautionary warning against its use in those with liver disease. This warning was placed because of adverse liver effects that were found in experiments involving 300mg  daily. The 50mg  dose does not apparently produce impairment of liver function nor, of course, do the much smaller 3mg  and 4.5mg  doses.  People who have received organ transplants and who therefore are taking immunosuppressive medication on a permanent basis are cautioned against the use of LDN because it may act to counter the effect of those medications.     Arthritis & Rheumatism  Volume 65, Issue 2, pages [phone]???538, February 2013

## 2017-06-15 NOTE — Progress Notes
Pt is a 67 y.o. female who presents today for:  Chief Complaint   Patient presents with   ??? Follow-up       Subjective:      Last visit 12/23/2016:    Patient has been doing well overall. Her soreness is mainly in the neck and occasionally in the hands, the PIPs and DIPs particularly. Patient added vitamin D and magnesium supplementation. She also started using lemon balm tincture at bedtime. It may help her sleep more deeply. She cannot tell if magnesium helps with the myofascial pain.     She tried water PT and switched to land PT for her neck pain with benefits.     Lab Results   Component Value Date    CRP <0.3 09/23/2016    CRP <0.5 05/19/2007    SRWEST 6 09/23/2016    SRWEST 11 05/19/2007       Lab Results   Component Value Date    VITD25OH 35 05/08/2017    VITD25OH 42 02/20/2017       I reviewed medical records as below and d/w patient results:   XR CERVICAL SPINE AP LAT OBL ODONTOID W FLEX EXT 7V : 09/23/2016 10:56 AM  ???  COMPARISON: None.  ???  INDICATION: neck pain likely combination of myofascial pain and spondylosis   ???  FINDINGS: There is mild C4-5 and moderate C5-6 degenerative disc disease. Range of motion appears adequate. There is minimal multilevel instability. The canal appears adequate. There is mild uncovertebral arthritis at C4-5 and C5-6. There is no   significant degenerative disease of the facets. There are foraminal spurs, with no stenosis by plain film.  ???  ???  IMPRESSION: C4-5 and C5-6 degenerative disc disease and uncovertebral arthritis. Mild instability.  ???  Signed by: Addison Lank   09/23/2016 12:39 PM    I reviewed blood tests with patient.   Component      Latest Ref Rng & Units 09/23/2016   HBS Antigen      Nonreactive Nonreactive   Hep B Surface Ab Quant      <10 IU/L <10   Hep B CORE Ab,IgM      Nonreactive Nonreactive   Hep B Core Ab,Total      Nonreactive Nonreactive   HCV Antibody Screen      Nonreactive Nonreactive   Sedimentation rate, Erythrocyte      <=25 mm/hr 6 C-Reactive Protein      <0.8 mg/dL <6.0   Antinuclear Ab      <1:40 titer <1:40   Cyclic Citrulline Ab IgG      0 - 19 Units 4   Rheumatoid Factor      <25 IU/mL <10       Interval history 06/15/2017:      Since last visit patient has been doing better. She still has some achiness in the neck, shoulder girdle, but better with PT. She also added OTC magnesium supplementation and she is sleeping better. She tried CBT with Alesia Morin and saw her for one visit and got some advice regarding sleep hygiene.      I reviewed and discussed medical records with patient below:   XR cervical spine ap+lat+obl+odontoid w flexion+extension (7 views)   Status: Final result   Study Result     XR CERVICAL SPINE AP LAT OBL ODONTOID W FLEX EXT 7V : 09/23/2016 10:56 AM  ???  COMPARISON: None.  ???  INDICATION: neck pain likely combination of myofascial pain and spondylosis   ???  FINDINGS: There is mild C4-5 and moderate C5-6 degenerative disc disease. Range of motion appears adequate. There is minimal multilevel instability. The canal appears adequate. There is mild uncovertebral arthritis at C4-5 and C5-6. There is no   significant degenerative disease of the facets. There are foraminal spurs, with no stenosis by plain film.  ???  ???  IMPRESSION: C4-5 and C5-6 degenerative disc disease and uncovertebral arthritis. Mild instability.  ???  Signed by: Addison Lank   09/23/2016 12:39 PM           I reviewed and discussed medical records with patient below:   Assessment:   Monzerat Handler is a 67 y.o. female seen in the Endocrine clinic for evaluation and management of osteoporosis.  ???  Plan:   1. Osteoporosis, unspecified osteoporosis type, unspecified pathological fracture presence: diagnosed in 2013 on DXA, about stable on DXA in 2018 but on different machine.  Risk factors for accelerated bone loss: +fam hx, post-menopausal woman, Caucasian, thin.  - pt is hesitant to start medication but if bone turnover markers are elevated would be amenable.  - Osteocalcin, N-MID (Bone Gla); Future  - Alkaline Phosphatase,Bone Spec; Future  - N-Telopeptide,random urine; Future  - PTH,Intact & Calcium; Future  - Vitamin D,25-Hydroxy; Future  - Encouraged weight bearing exercise  - Goal calcium intake is 1200 mg of elemental calcium per day, ideally more in diet than in supplement.  - Goal vitamin D intake is 800 IU of vitamin D3 per day. Goal vit D 25 OH level is >30.  - Next DXA is planned for 04/2018  ???  2. Thyroid nodule: small nodule palpated  - US thyroid-parathyroid; Future  ???  3. Other fatigue  - TSH; Future  - Free T4; Future  ???  4. Dysthymia: symptoms are concerning for depression  - pt is seeing therapist on Friday  ???  5. Invasive ductal carcinoma of left breast, ER+PR+HER2-  - on Tamoxifen  ???    Dr. Hetty Ely 01/21/2017.    I reviewed and discussed results with patient below:   Lab Results   Component Value Date    CRP <0.3 09/23/2016    CRP <0.5 05/19/2007    SRWEST 6 09/23/2016    SRWEST 11 05/19/2007       Lab Results   Component Value Date    VITD25OH 35 05/08/2017    VITD25OH 42 02/20/2017       ROS No Yes   No Yes    No Yes  Fever [x]   []  Vision loss [x]   []  Unexpected bleeding [x]   []       Impression:     - Fibromyalgia   []  controled  []  low activity []  moderate activity []  highly active   - H/o migraine headaches before menopause.    []  controled            []  low activity           []  moderate activity []  highly active         - Osteoarthritis hands, doing home exercises with no significant pain   []  controled            []  low activity           []  moderate activity []  highly active         - Neck pain,  likely combination of myofascial pain and spondylosis, 09/2016 xray cervical spine shows DDD cervical spine    []  controled            []   low activity           []  moderate activity []  highly active         - Osteoporosis, under the care of Dr. Lorenza Chick []  controled            []  low activity           []  moderate activity []  highly active         - Insomnia   []  controled            []  low activity           []  moderate activity []  highly active      Plan:   ???  - Consider low dose naltrexone (LDN) for fibromyalgia treatment.   Benefits and risks of LDN discussed with patient.   Patient to read about naltrexone.  Patient is aware they cannot take LDN while consuming narcotics or alcohol and  they must be willing to discontinue these substances for at least 2 weeks in order to try LDN.     Patient to let me know if she is interested in starting LDN and then I will check with our pharmacist for drug-drug interactions between LDN and patient's current medications.     When given the okay by me or our pharmacist, patient to start LDN 1.5 mg per day and increase as tolerated to 4.5 mg per day over the course of 1 month.     If patient has a medical thyroid condition, thyroid blood tests must be monitored while on LDN.     If we proceed with LDN, we will send prescription to:  [facility]  [address]  [address]  [place], [place] [address]  Phone: [phone]  LDN prescription can be picked up or mailed to patient.    - Consider referral to orthopedics if L finger mucoid cyst becomes bothersome    - Continue PT as needed                        ???  - Follow with Dr. Lorenza Chick for vitamin D supplementation recommendations  ??????  - Continue magnesium citrate 250-300 mg daily and calcium citrate 500 mg in combination with 250 mg daily.  ???  - Continue turmeric, ginger, and fish oil supplementation   ???   - Continue OTC lemon balm tea to help with tension and stress     - 2018 EULAR recommendations for hands osteoarthritis:   Chondroitin sulfate may be used in patients with hand OA for pain relief and improvement in functioning.   Reference: Ayesha Rumpf, et al. 2018 update of the EULAR recommendations for the management of hand osteoarthritis [published online November 11, 2016]. Ann Rheum Dis. doi:10.1136/annrheumdis-2018-213826    RTC 6 months PRN    No orders of the defined types were placed in this encounter.    I discussed with patient at length need of regular exercise, weight management and methods of coping with psychosocial stressors.   I reviewed the plan of care in detail with patient. Patient had the opportunity to ask questions and expressed satisfaction with the plan of care.  The benefits and risks of medications were also explained in detail.    []  If box is checked, patient advised to monitor CBC (white blood cell count, hemoglobin, platelet count), liver enzyme levels (AST, ALT), and kidney function (creatinine) every 3 months for  possible __ side effects.    []  If box is checked, patient is taking plaquenil (hydroxychloroquine), and advised to schedule retinal exam/eye exam by ophthomology once a year.     [x]  Total of  _25_  minutes spent on the visit, with    >50% of time spent directly counseling the patient.     See A/P section for content of discussion    Procedures:         Past Medical History:   No specialty comments available.    Patient Active Problem List    Diagnosis Date Noted   ??? Vitreous floater, bilateral 06/11/2017     No retinal detachment, retinal tear, or vitreous hemorrhage on dilated fundus exam. Signs and symptoms reviewed.     ??? Increased PTH level 05/08/2017     Sees Dr. Selena Batten in endocrinology.      ??? Syncope and collapse 06/25/2016     2018, ECG/Labs/MRI w/o gad unrevealing, saw neurology Dr. Lorina Rabon, plan; EEG, followup      ??? Immune to measles 05/09/2016     (+) Ab 2018     ??? Invasive ductal carcinoma of left breast, ER+PR+HER2- 01/29/2016     2017, Bilateral tomosynthesis screening mammogram on 12/11/2015. There were no suspicious findings seen. She had postsurgical changes in both breasts. Patient then developed bilateral breast pain, left greater than right. She was sent for bilateral diagnostic ultrasounds on 01/15/2016. The right breast was negative. However, in the left breast, there was an irregular mass seen with indistinct margins, measuring 5 x 6 x 4 mm, at 12 o'clock, 6 cm from the nipple. Her left axilla was negative. Genetic testing: NO MUTATION DETECTED/This panel tests three Founder Mutations in the BRCA1/2 genes (BRCA1 187delAG, BRCA1 5385insC, BRCA2 6174delT; lumpectomy: sentinal node negative x 2.  TAM recommended by oncology, annual endometrial surveillance given history by GYN.      ??? Breast pain 01/09/2016     [facility]-> plan per NP, imaging, lifestyle changes     ??? Cataract, nuclear sclerotic, both eyes 11/29/2015     Not yet visually significant. Continue observation.      ??? Status post LASIK surgery of both eyes 11/29/2015     Performed by Dr. Ellin Goodie (2000 ?). Originally set with left eye for distance and low myopia for right.     ??? Fibroid 04/09/2015     2017 on ultrasound.      ??? BPPV (benign paroxysmal positional vertigo) 07/19/2013   ??? Menopause 04/14/2013   ??? Seborrheic dermatitis 05/11/2012   ??? 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 PPD positive 04/07/2012   ??? Palpitations 04/07/2012     2009 ER PACs, PVCs, sinus arrythmia, ECHO, Stress ECHO WNL 2009     ??? OA (osteoarthritis) 04/07/2012     Hands 2011, anti CCP neg, no evidence inflammation     ??? Visit for preventive health examination 04/07/2012     Colo noscopy= hyperplastic polyps 2017     ??? Hearing loss, right 04/07/2012     Otosclerosis, s/p stapedectomy     ??? Eye allergies 01/08/2012     Not having significant symptoms at this time, but could use over the counter anti-histamine eye drops, like ketotifen or artificial tears as needed if symptoms recur.     ??? Osteoporosis 11/10/2011     DEXA 2013, spine-2.4, femoral neck -2.9. On HT for menopause sx and as osteoporosis rx.; PA lumbar spine T-score of -2.7, will begin  TAM per oncology.          Past Medical History relevant to rheumatology:         Current Medications Relevant to Rheumatology:     Rheum Meds       Disp Refills Start End    tamoxifen 20 mg tablet 90 tablet 3 03/23/2017     Sig - Route: Take 1 tablet (20 mg total) by mouth daily. - Oral        Rheum Meds Other     Oil Soluble Vitamins Sig    Cholecalciferol (VITAMIN D) 1000 unit tablet Take 1,000 Units by mouth daily 200 units x5 days a wk      Cholecalciferol (VITAMIN D3) 5000 units CAPS Take 1 capsule by mouth daily.    Calcium Sig    CALCIUM CITRATE PO Take 630 mg by mouth daily.           Allergies   Allergen Reactions   ??? Penicillins Other (See Comments) and Rash     Ampicillin, flesh rash in 1980s   ??? Menadiol Sodium Diphosphate Rash   ??? Other      Red [place] coloring on meds such as advil- not allergic to actual med- just the coloring   ??? Acetaminophen Rash       Physical Exam:   BP 110/60  ~ Pulse 79  ~ Temp 36.8 ???C (98.3 ???F) (Oral)  ~ Resp 17  ~ Ht 5' 4'' (1.626 m)  ~ Wt 128 lb 3.2 oz (58.2 kg)  ~ BMI 22.01 kg/m???   Checked Box= body part examined.  Abnormalities noted are listed next to the body part.   NL AbNL  Comment  NL AbNL Comment   General [x]  []   ENT      Eyes       Oropharynx []   []         Conjunct/Sclera [x]   []       Nasal mucosa []   []         Lids/Periorbital [x]   []       Ears []   []         EOM [x]   []    Neck         Pupils [x]   []       Inspect/palp []   []      Psychiatric       Thyroid []   []         Oriented (p,p,t) [x]   []       Neck ROM/spple []   []         Affect [x]   []    Lymphatic         Judgemnt/insite [x]   []       Neck []   []      Neurological       Supraclavicular []   []         CN I-XII [x]   []    Respiratory         Sensation [x]   []       Auscultation []   []      Skin         Chest expansn []   []         Inspect [x]   []    Cardiovascular         Palpate [x]   []       Auscultation []   []  Periungal cap bed []   []       Depnt edema []   []      Musculoskeletal    GI  Motor []   []       Abdomen []   []        Gait []   []       Liver/Spleen []   []        Nailbeds []   [x]    Other findings:      []  28 Joints, or  []  64 joints were examined.    Either exam includes all 4 extremites. Except where noted, the joints examined have FROM, no tenderness or swelling; normal stability, and normal strength.    Abnormalities are coded on homonculus or detailed below:    - Bony hypertrophy small joints of the fingers, particularly middle, ring, and pinky finger PIPs.   - Enlarged R hand DIPs  - hypersensitivity to light touch   - mild swelling and tenderness to palpation R 3rd DIP   - mucoid cyst L 3rd DIP       ~~~~~~~~~~~~~~~~~~~~~~~~~~~~~~~~~~~~~~~~~~~~~~~~~~~~~~~~~~~~~~~~    RIGHT         LEFT   TMJ [] []   ~ [] []  TMJ   Shoulder [] []  ~ [] []  Shoulder                Left box: Tender   Elbow [] []  ~ [] []  Elbow               Right box: Swollen   Wrist [] []  ~ [] []  Wrist   CMC1 [] []  ~ [] []  CMC1       MCP5 MCP4 MCP3 MCP2 MCP1         ~ MCP1 MCP2 MCP3 MCP4 MCP5       [] []  [] []  [] []  [] []  [] []   [] []  [] []  [] []  [] []  [] []        PIP5 PIP4 PIP3 PIP2 IP1 ~ IP1 PIP2 PIP3 PIP4 PIP5       [] []  [] []  [] []  [] []  [] []   [] []  [] []  [] []  [] []  [] []        DIP5 DIP4 DIP3 DIP2  ~  DIP2 DIP3 DIP4 DIP5       [] []  [] []  [x] [x]  [] []     [] []  [] []  [] []  [] []       Sacroiliac []  ~             []  Sacroiliac                Hip  [] []  ~        [] []  Hip                  Knee  [] []  ~        [] []  Knee        Ankle    [] []  ~        [] []  Ankle           MTP5 MTP4 MTP3 MTP2 MTP1 ~ MTP1 MTP2 MTP3 MTP4 MCP5       [] []  [] []  [] []  [] []  [] []   [] []  [] []  [] []  [] []  [] []        Toe5 Toe4 Toe3 Toe2 Toe1 ~ Toe1 Toe2 Toe3 Toe4 Toe5        [] []  [] []  [] []  [] []  [] []   [] []  [] []  [] []  [] []  [] []                      ~~~~~~~~~~~~~~~~~~~~~~~~~~~~~~~~~~~~~~~~~~~~~~~~~~~~~~~~~~~~~~~~  Fibromyalgia tender point count: __/18      Recent Labs and Test Results:     Lab Results Component Value Date    CRP <0.3 09/23/2016    CRP <0.5 05/19/2007    SRWEST 6 09/23/2016    SRWEST 11 05/19/2007     Lab  Results   Component Value Date    WBC 3.76 (L) 09/23/2016    HGB 13.1 09/23/2016    HCT 37.3 09/23/2016    PLT 202 09/23/2016    NEUTABS 2.08 09/23/2016    LYMPHABS 1.12 (L) 09/23/2016     Lab Results   Component Value Date    GLUCOSE 102 (H) 05/08/2017    CREAT 0.82 05/08/2017    CALCIUM 9.1 05/08/2017    TOTPRO 6.6 05/08/2017    ALBUMIN 4.4 05/08/2017    AST 19 05/08/2017    ALT 15 05/08/2017    ALKPHOS 48 05/08/2017     No results found for: C3, C4, DSDNAAB, NDNAABIFA  Lab Results   Component Value Date    PROTCLUR Negative 02/14/2016    BLDUR 1+ (A) 02/14/2016    LEUKESTUR Negative 02/14/2016    RBCSUR 11 02/14/2016    WBCSUR 2 02/14/2016     Lab Results   Component Value Date    VITD25OH 35 05/08/2017    VITD25OH 42 02/20/2017     Lab Results   Component Value Date    CKTOT 64 06/02/2016       No visits with results within 1 Month(s) from this visit.   Latest known visit with results is:   Lab Visit on 05/08/2017   Component Date Value   ??? Vitamin D,25-Hydroxy 05/08/2017 35    ??? TSH 05/08/2017 0.89    ??? Sodium 05/08/2017 144    ??? Potassium 05/08/2017 4.3    ??? Chloride 05/08/2017 104    ??? Total CO2 05/08/2017 29    ??? Anion Gap 05/08/2017 11    ??? Glucose 05/08/2017 102*   ??? GFR Estimate for Non-Afr* 05/08/2017 75    ??? GFR Estimate for African* 05/08/2017 87    ??? GFR Additional Informati* 05/08/2017 See Comment    ??? Creatinine 05/08/2017 0.82    ??? Urea Nitrogen 05/08/2017 22    ??? Calcium 05/08/2017 9.1    ??? Total Protein 05/08/2017 6.6    ??? Albumin 05/08/2017 4.4    ??? Bilirubin,Total 05/08/2017 0.2    ??? Alkaline Phosphatase 05/08/2017 48    ??? Aspartate Aminotransfera* 05/08/2017 19    ??? Alanine Aminotransferase 05/08/2017 15    ??? Chol,LDL,Quant 05/08/2017 116*   ??? Cholesterol 05/08/2017 213    ??? Cholesterol, HDL 05/08/2017 81          Attestation:  Scribe Signature: I, Nicolette Morris, have scribed for Dr. Linus Mako with the documentation for Terre Hanneman on 06/15/2017 at 9:25 AM.    Physician Signature:  Dr. Linus Mako 07/08/2017 9:25 AM     I have reviewed this note, scribed by Mariane Baumgarten and attest that it is an accurate representation of my H & P and other events of the outpatient visit except if otherwise noted.

## 2017-06-17 NOTE — Progress Notes
BREAST FOLLOW-UP OFFICE VISIT NOTE    ???PATIENT: Robin Spencer  ???MRN: [id number]  ???DOB: [birth year]    ???DATE OF SERVICE: 06/18/2017    ???REFERRING PRACTITIONER: Cyril Loosen., MD  ???PRIMARY CARE PROVIDER: Pregler, Weyman Croon., MD  ???RESIDENT PHYSICIAN: Elenore Paddy. Murrell Converse, MD  ???ATTENDING PHYSICIAN: Kennedy Bucker, MD     ???CHIEF COMPLAINT: 6 month follow up ~ 1 year and 2 month since completion of RT    ???IDENTIFYING DATA: Robin Spencer is a 67 y.o. female with a pT2N0(i-), Grade 1, ER+, PR-, Her2- IDC of the left breast s/p BCS+SNB on 02/29/16. She completed RT 04/24/16, on tamoxifen.    ???RADIATION HISTORY: She received 42.56 Gy in 16 fractions to the breast followed by a boost of 10 Gy in 4 fractions to the lumpectomy cavity. Radiation was completed on 04/24/2016.     ???DATA OF INTERVAL HISTORY:  ??? Breast-FU Data Fields ~ Data ~ Comments    ??? Most recent imaging date (m/d/yyyy) ~ 12/11/16 bilat mm and [place] benign ~     ??? Local Failure (LF) ~  No Local Failure ~     ??? If LF, date (m/d/yyyy) ~  ~     ??? Regional failure (RF) ~  No Regional Failure ~     ??? If RF, date (m/d/yyyy) ~  ~     ??? Distant Mets (DM) ~  No Distant Metastases ~     ??? If DM, date (m/d/yyyy) ~  ~     ??? Any failure within RT field? ~  No ~     ??? RT related complications ~  No ~     ??? Complications ~  ~     ??? Highest complication grade  (CTCAE) ~  ---- ~  ???     Subjective:       Interval History: [name] returns for routine posttreatment follow up since completing radiation approximately 1 year and 2 months ago. Today, she is feeling well. Regarding the treated left breast, she reports that the breast and especially NAC appear paler and remains fuller compared to the right breast, which is slowly improving. She has had baseline breast pain since puberty, but back to baseline. However, the skin over the left breast feels different and slightly more sensitive compared to the right. Still maintains follow-up with rheumatologists for fibromyalgia. She see PT for neck/shoulder pain. ROM of left upper extremity is tight and limited with extreme abduction. She denies cough, shortness of breath, swelling in the arm, residual fatigue, or headaches.      Endocrine: On tamoxifen and following with Dr Maye Hides, last seen 02/25/17, at which time she was tolerating it well, and previous hot flashes have subsided considerably on its own. Still experiencing vaginal dryness, for which she takes topical vaginal estrogen creams that Dr. Maye Hides is aware about.     Imaging: None new. Most recent was bilat MM and [place] from 12/11/16: ext dense, benign       PERTINENT PAST MEDICAL HISTORY:   Past Medical History:   Diagnosis Date   ??? Anxiety    ??? Eczema    ??? Fibroids    ??? GERD (gastroesophageal reflux disease)     esophagitis   ??? History of migraine headaches    ??? History of radiation therapy 04/2016    L breast   ??? Otosclerosis    ??? Post-operative nausea and vomiting    ??? Rectal fissure    ???  Vitreous detachment      Past Surgical History:   Procedure Laterality Date   ??? BREAST BIOPSY  1986    Fibroadenoma, ductal hyperplasia   ??? Laparoscopy for subfertility     ??? Stapedectomy, right         GYN HISTORY:   OB History   Obstetric Comments   Menses started: 54   Gravida: 0   Para:  0   Age of first live birth: n/a    Breast feed: n/a    Contraceptives/Type:  Y, 18-30    Menopause reached at: 46   Hormone replacement/Type: Y, 47-64    Fertility Tx: Y        ALLERGIES:   Allergies as of 06/18/2017 - Review Complete 06/15/2017   Allergen Reaction Noted   ??? Penicillins Other (See Comments) and Rash 01/08/2012   ??? Menadiol sodium diphosphate Rash 03/19/2016   ??? Other  02/29/2016   ??? Acetaminophen Rash 03/02/2016     MEDICATIONS:   Current Outpatient Prescriptions   Medication Sig   ??? ACYCLOVIR 400 mg tablet TAKE 1 TABLET (400 MG TOTAL) BY MOUTH TWO (2) TIMES DAILY.   ??? BIOTIN PO Take 1 tablet by mouth daily .     ??? CALCIUM CITRATE PO Take 630 mg by mouth daily. ??? Cholecalciferol (VITAMIN D) 1000 unit tablet Take 1,000 Units by mouth daily 200 units x5 days a wk     ??? cyanocobalamin 500 mcg tablet Take 500 mcg by mouth daily.   ??? estradiol (VAGIFEM) 10 mcg vaginal tablet Place 1 tablet (10 mcg total) vaginally two (2) times a week.   ??? MAGNESIUM CITRATE PO Take 150 mg by mouth two (2) times daily.   ??? Omega-3 Fatty Acids (FISH OIL PO) Take 1,400 mg by mouth two (2) times daily.   ??? tamoxifen 20 mg tablet Take 1 tablet (20 mg total) by mouth daily.   ??? triamcinolone 0.5% cream Apply topically as needed for.   ??? UNABLE TO FIND Med Name: [place] supplement- Epimedium, Tumeric, Japanese, Fleeceflower, EPA/DHA .   ??? zoster vac recomb adjuvanted (SHINGRIX) 50 mcg injection 2nd of 2 Shingrix vaccinations now at pharmacy. (Patient not taking: Reported on 05/08/2017.)     No current facility-administered medications for this encounter.        SOCIAL HX:   Social History     Social History   ??? Marital status: Married     Spouse name: N/A   ??? Number of children: N/A   ??? Years of education: N/A     Occupational History   ??? Not on file.     Social History Main Topics   ??? Smoking status: Never Smoker   ??? Smokeless tobacco: Never Used   ??? Alcohol use No   ??? Drug use: No   ??? Sexual activity: Yes     Partners: Male     Birth control/ protection: None     Other Topics Concern   ??? Not on file     Social History Narrative    Patient is married, and has two children. She is of Ashkenazi Agricultural engineer.        Work: Retired from [employer] relations        Exercise:        Diet:        Transfusion:        Religion:           FAMILY HX:  Family History   Problem Relation Age of Onset   ??? Hypertension Father    ??? Stroke Father    ??? Hyperlipidemia Father    ??? Depression Father    ??? Prostate cancer Father 52   ??? Hypertension Mother    ??? Asthma Mother    ??? Endometrial cancer Mother 59   ??? Hyperthyroidism Mother    ??? Prostate cancer Brother 71   ??? Breast cancer Neg Hx ??? Ovarian cancer Neg Hx    ??? Malignant hypertension Neg Hx       Objective:      Physical Exam:  There were no vitals taken for this visit.   GENERAL: The patient is a well-developed, well-nourished, female in no acute distress.  HEENT: Normocephalic and atraumatic. Anicteric sclerae. Mucous membranes are moist. Hearing is intact bilaterally.   NECK: No thyromegaly.  BREASTS: Visualization reveals very mild residual hyperpigmentation on left and left NAC is mildly hypopigmented vs right.  Left breast slightly larger than right. Palpation reveals no discrete nodulrity but bilat breasts are very dense centrally.  Very mild cutaneous and subcutaneous fibrosis on left.  LYMPHATICS: No sclav/iclav/axillary LAD palpated bilaterally  CHEST: Non labored breathing.   HEART: Normal sinus rhythm.   ABDOMEN: Soft, nontender, nondistended.   MUSCULOSKELETAL SYSTEM: No upper extremity lymphedema. Shoulder abduction and external rotation fully intact.   NEUROLOGIC EXAM: Patient is alert and oriented. Patient ambulates with a normal gait.    Lab Review / Pathology / Radiology:     See HPI     Assessment:      Orvilla Truett is a 67 y.o. female with a pT2N0(i-), Grade 1, ER+, PR-, Her2- IDC of the left breast s/p BCS+SNB on 02/29/16 followed by radiation, on tamoxifen. There is no clinical or radiographic evidence for disease recurrence or progression.      Plan/ Recommendation:      - On tamoxifen, following Dr. Maye Hides  - Bilateral mammogram and ultrasound due 12/11/17, requisition already placed; supplemental U/S due to extremely dense breast tissue and mammo occult cancer  - I explained that I would be happy to follow up with her at any time in the future in person, by phone, or by email should she have any questions or concerns. I have requested follow up in  12 months.    Thank you kindly for allowing me to participate in the care of this lovely patient.       cc Pregler, Weyman Croon., MD  Cyril Loosen., MD Author: Elenore Paddy. Murrell Converse 06/17/2017 7:57 AM

## 2017-06-18 ENCOUNTER — Inpatient Hospital Stay: Payer: BC Managed Care – POS | Attending: Radiation Oncology

## 2017-06-18 DIAGNOSIS — C50912 Malignant neoplasm of unspecified site of left female breast: Secondary | ICD-10-CM

## 2017-06-18 NOTE — Patient Instructions
Thank you for visiting [place] today in [place].     Please check out at the front desk and schedule your follow-up appointment with Dr. Sheppard Coil in 12 months. It is very important to follow up as instructed because it allows [place] to provide you the best care possible.     You will be due for follow up mammogram & breasts ultrasound on or after September 2019.  Please call the Women's Imaging Department at [phone] to schedule the mammogram at your convenience. The Women's Imaging Department will obtaininsurance authorizationonce you have scheduled the appointment.    If you have any questions or concerns between now and your next visit, please feel free to contact us at [phone].     We look forward to seeing you next time.

## 2017-06-23 DIAGNOSIS — Z170 Estrogen receptor positive status [ER+]: Secondary | ICD-10-CM

## 2017-06-23 DIAGNOSIS — C50112 Malignant neoplasm of central portion of left female breast: Secondary | ICD-10-CM

## 2017-07-01 ENCOUNTER — Telehealth: Payer: BC Managed Care – POS

## 2017-07-01 NOTE — Telephone Encounter
Call Back Request    MD:  Dr. Lovena Le    Reason for call back: Sonia from [facility] is requesting chart notes be signed by Dr. Lovena Le. Caller states that the notes were faxed over on 4/11 for charts from 04/20/17 and 10/23/16 , but not singed.  Please advise and fax back signed notes.  [phone]  [phone]     Any Symptoms:  []  Yes  [x]  No      ? If yes, what symptoms are you experiencing:    o Duration of symptoms (how long):    o Have you taken medication for symptoms (OTC or Rx):      Patient or caller has been notified of the 24-48 hour turnaround time.

## 2017-07-01 NOTE — Telephone Encounter
Spoke with Alleen Borne progress notes will be faxed our clinic for MD to sign.  Thank you

## 2017-07-08 DIAGNOSIS — M542 Cervicalgia: Secondary | ICD-10-CM

## 2017-08-03 ENCOUNTER — Ambulatory Visit: Payer: BC Managed Care – POS

## 2017-08-03 DIAGNOSIS — L989 Disorder of the skin and subcutaneous tissue, unspecified: Secondary | ICD-10-CM

## 2017-08-03 DIAGNOSIS — L821 Other seborrheic keratosis: Secondary | ICD-10-CM

## 2017-08-03 NOTE — Patient Instructions
Sun-protective behavior-    Whenever you will be out in the sun for more than 20 minutes:    Wear comfortable, breathable clothing to minimize your skin exposure to the sun.    Use sunscreen with SPF 30 + (Try to use titanium dioxide and/or zinc oxide products rather than chemical products; consider Cerave brand) on any exposed body parts (Do not get in eyes, mouth, mucus membranes). Re-apply after prolonged exposure and/or water immersion or sweating.    Wear a broad-brimmed hat to protect your head, face, neck, and eyes from the sun.    Wear sunglasses that provide complete UV-A and UV-B protection.    Try to minimize sun exposure, especially between the hours of 10 am and 4 pm when the sun's rays are the strongest. Utilize available sources of shade when possible.     Please follow up with the dermatologist asap as we discussed.  For

## 2017-08-03 NOTE — Progress Notes
SUBJECTIVE:   Robin Spencer returns to clinic today for evaluation of a few days of flaking at her scalp and a 2 week Hx of enlarging, asymptomatic, scaly discoloration at the left side of her nose. No known trauma or irritation.  She also reports she has felt a skin lesion somewhere at her back although she is not quite sure where.  It is asymptomatic but she feels that sometime she has been able to touch something that feels a bit unusual with her finger.    Past Medical History:   Past Medical History:   Diagnosis Date   ??? Anxiety    ??? Breast cancer (HCC/RAF)    ??? Eczema    ??? Fibroids    ??? GERD (gastroesophageal reflux disease)     esophagitis   ??? History of migraine headaches    ??? History of radiation therapy 04/2016    L breast   ??? Otosclerosis    ??? Post-operative nausea and vomiting    ??? Rectal fissure    ??? Vitreous detachment        Medication:  Current Outpatient Prescriptions   Medication Sig   ??? ACYCLOVIR 400 mg tablet TAKE 1 TABLET (400 MG TOTAL) BY MOUTH TWO (2) TIMES DAILY.   ??? BIOTIN PO Take 1 tablet by mouth daily .     ??? CALCIUM CITRATE PO Take 630 mg by mouth daily.   ??? Cholecalciferol (VITAMIN D) 1000 unit tablet Take 1,000 Units by mouth daily 200 units x5 days a wk     ??? Cholecalciferol (VITAMIN D3) 5000 units CAPS Take 1 capsule by mouth daily.   ??? cyanocobalamin 500 mcg tablet Take 500 mcg by mouth daily.   ??? estradiol (VAGIFEM) 10 mcg vaginal tablet Place 1 tablet (10 mcg total) vaginally two (2) times a week.   ??? MAGNESIUM CITRATE PO Take 150 mg by mouth two (2) times daily.   ??? Omega-3 Fatty Acids (FISH OIL PO) Take 1,400 mg by mouth two (2) times daily.   ??? tamoxifen 20 mg tablet Take 1 tablet (20 mg total) by mouth daily.   ??? triamcinolone 0.5% cream Apply topically as needed for.   ??? UNABLE TO FIND Med Name: [place] supplement- Epimedium, Tumeric, Japanese, Fleeceflower, EPA/DHA .   ??? zoster vac recomb adjuvanted (SHINGRIX) 50 mcg injection 2nd of 2 Shingrix vaccinations now at pharmacy. (Patient not taking: Reported on 05/08/2017.)     No current facility-administered medications for this visit.        Allergies:   Allergies   Allergen Reactions   ??? Penicillins Other (See Comments) and Rash     Ampicillin, flesh rash in 1980s   ??? Menadiol Sodium Diphosphate Rash   ??? Other      [place] coloring on meds such as advil- not allergic to actual med- just the coloring   ??? Acetaminophen Rash       Social History:  Social History     Social History   ??? Marital status: Married     Spouse name: N/A   ??? Number of children: N/A   ??? Years of education: N/A     Occupational History   ??? Not on file.     Social History Main Topics   ??? Smoking status: Never Smoker   ??? Smokeless tobacco: Never Used   ??? Alcohol use No   ??? Drug use: No   ??? Sexual activity: Yes     Partners: Male  Birth control/ protection: None     Other Topics Concern   ??? Not on file     Social History Narrative    Patient is married, and has two children. She is of Ashkenazi Agricultural engineer.        Work: Retired from [employer] relations        Exercise:        Diet:        Transfusion:        Religion:           Past Surgery History:  Past Surgical History:   Procedure Laterality Date   ??? BREAST BIOPSY  1986    Fibroadenoma, ductal hyperplasia   ??? Laparoscopy for subfertility     ??? Stapedectomy, right          Family History:  Family History   Problem Relation Age of Onset   ??? Hypertension Father    ??? Stroke Father    ??? Hyperlipidemia Father    ??? Depression Father    ??? Prostate cancer Father 68   ??? Hypertension Mother    ??? Asthma Mother    ??? Endometrial cancer Mother 103   ??? Hyperthyroidism Mother    ??? Prostate cancer Brother 91   ??? Breast cancer Neg Hx    ??? Ovarian cancer Neg Hx    ??? Malignant hypertension Neg Hx        Review of Systems:   14 points ROS reviewed. Pertinent positives and negatives stated above.     PHYSICAL EXAM:   Physical Exam:   Vitals:   Last Recorded Vital Signs: 08/03/17 1258   BP: 104/71   Pulse: 77   Resp: 18   Temp: 37 ???C (98.6 ???F)   SpO2: 96%      BP Readings from Last 3 Encounters:   08/03/17 104/71   06/18/17 114/75   06/15/17 110/60     Wt Readings from Last 3 Encounters:   08/03/17 129 lb 9.6 oz (58.8 kg)   06/15/17 128 lb 3.2 oz (58.2 kg)   05/08/17 127 lb (57.6 kg)     Body mass index is 21.9 kg/m???.  No LMP recorded. Patient is postmenopausal.    General: Alert and oriented x 3.   Well-dressed, well-nourished  No acute distress  The respirations are regular and non-labored.  Speaking easily in full sentences, with no retractions or use of accessory muscles.  The voice is within normal limits.    Sitting comfortably on the exam room chair.     HEENT:   Normocephalic, atraumatic  Anicteric  Mucous membranes are moist.     Neck: Supple, midline trachea, no thyromegaly, JVD, lymphadenopathy, or mass lesion. No meningeal signs are present. No bony, joint line, or soft tissue tenderness is appreciated.     Skin: Warm. No hair abnormality, scalp abnormality, or scalp scaling/flaking noted. Bumpy, non-tender, 5 mm oblong shaped area of hyperkeratosis at left side of nose.  Examination of the back reveals scattered seborrheic keratoses.  I do not see any sign of malignancy or premalignant lesion.  No petechiae, ecchymosis, or rash is noted.    Neurologic: CN II-XII grossly intact. No facial droop or gaze deviation. The gait is smooth and anantalgic, with no sign of dysequilibrium. No focal deficit is noted.     Psychiatric: The affect is appropriate.  The mood is euthymic.  Speech is within normal limits in form and content.  Labs and Imaging Results:      Results for orders placed or performed during the hospital encounter of 12/27/15   Basic Metabolic Panel   Result Value Ref Range    Sodium 141 135 - 146 mmol/L    Potassium 4.2 3.6 - 5.3 mmol/L    Chloride 104 96 - 106 mmol/L    Total CO2 26 20 - 30 mmol/L    Anion Gap 11 8 - 19    Glucose 105 (H) 65 - 99 mg/dL GFR Estimate for Non-African American >89 See GFR Additional Information    GFR Estimate for African American >89 See GFR Additional Information    GFR Additional Information See Comment     Creatinine 0.70 0.60 - 1.30 mg/dL    Urea Nitrogen 16 7 - 22 mg/dL    Calcium 8.9 8.6 - 16.1 mg/dL     Results for orders placed or performed in visit on 05/08/17   Comprehensive Metabolic Panel   Result Value Ref Range    Sodium 144 135 - 146 mmol/L    Potassium 4.3 3.6 - 5.3 mmol/L    Chloride 104 96 - 106 mmol/L    Total CO2 29 20 - 30 mmol/L    Anion Gap 11 8 - 19    Glucose 102 (H) 65 - 99 mg/dL    GFR Estimate for Non-African American 75 See GFR Additional Information    GFR Estimate for African American 87 See GFR Additional Information    GFR Additional Information See Comment     Creatinine 0.82 0.60 - 1.30 mg/dL    Urea Nitrogen 22 7 - 22 mg/dL    Calcium 9.1 8.6 - 09.6 mg/dL    Total Protein 6.6 6.1 - 8.2 g/dL    Albumin 4.4 3.9 - 5.0 g/dL    Bilirubin,Total 0.2 0.1 - 1.2 mg/dL    Alkaline Phosphatase 48 37 - 113 U/L    Aspartate Aminotransferase 19 13 - 47 U/L    Alanine Aminotransferase 15 8 - 64 U/L   Results for orders placed or performed in visit on 09/23/16   Comprehensive Metabolic Panel   Result Value Ref Range    Sodium 141 135 - 146 mmol/L    Potassium 4.8 3.6 - 5.3 mmol/L    Chloride 101 96 - 106 mmol/L    Total CO2 24 20 - 30 mmol/L    Anion Gap 16 8 - 19    Glucose 92 65 - 99 mg/dL    GFR Estimate for Non-African American 87 See GFR Additional Information    GFR Estimate for African American >89 See GFR Additional Information    GFR Additional Information See Comment     Creatinine 0.73 0.60 - 1.30 mg/dL    Urea Nitrogen 15 7 - 22 mg/dL    Calcium 9.1 8.6 - 04.5 mg/dL    Total Protein 6.6 6.1 - 8.2 g/dL    Albumin 4.5 3.9 - 5.0 g/dL    Bilirubin,Total 0.4 0.1 - 1.2 mg/dL    Alkaline Phosphatase 56 37 - 113 U/L    Aspartate Aminotransferase 14 13 - 47 U/L    Alanine Aminotransferase 10 8 - 64 U/L Results for orders placed or performed in visit on 04/20/14   Lipid Panel   Result Value Ref Range    Lipid Panel Fasting     Cholesterol 246 None mg/dL    Cholesterol,LDL,Calc 144 (H) <100 mg/dL    Cholesterol, HDL 82 >50 mg/dL  Triglycerides 99 <150 mg/dL    Non-HDL,Chol,Calc 811 (H) <130 mg/dL   Results for orders placed or performed in visit on 05/06/13   Lipid Panel   Result Value Ref Range    Lipid Panel Fasting     Cholesterol 269 None mg/dL    Cholesterol,LDL,Calc 151 (H) <100 mg/dL    Cholesterol, HDL 96 >50 mg/dL    Triglycerides 914 <782 mg/dL    Non-HDL,Chol,Calc 956 (H) <130 mg/dL     Results for orders placed or performed in visit on 06/02/16   CBC   Result Value Ref Range    White Blood Cell Count 4.78 4.16 - 9.95 x10E3/uL    Red Blood Cell Count 3.56 (L) 3.96 - 5.09 x10E6/uL    Hemoglobin 13.0 11.6 - 15.2 g/dL    Hematocrit 21.3 08.6 - 45.2 %    Mean Corpuscular Volume 100.6 (H) 79.3 - 98.6 fL    Mean Corpuscular Hemoglobin 36.5 (H) 26.4 - 33.4 pg    MCH Concentration 36.3 (H) 31.5 - 35.5 g/dL    Red Cell Distribution Width-SD 47.8 36.9 - 48.3 fL    Red Cell Distribution Width-CV 13.2 11.1 - 15.5 %    Platelet Count, Auto 223 143 - 398 x10E3/uL    Mean Platelet Volume 10.1 9.3 - 13.0 fL    Nucleated RBC%, automated 0.0 No Ref. Range %    Absolute Nucleated RBC Count 0.00 0.00 - 0.00 x10E3/uL     Results for orders placed or performed in visit on 09/23/16   CBC   Result Value Ref Range    White Blood Cell Count 3.76 (L) 4.16 - 9.95 x10E3/uL    Red Blood Cell Count 3.74 (L) 3.96 - 5.09 x10E6/uL    Hemoglobin 13.1 11.6 - 15.2 g/dL    Hematocrit 57.8 46.9 - 45.2 %    Mean Corpuscular Volume 99.7 (H) 79.3 - 98.6 fL    Mean Corpuscular Hemoglobin 35.0 (H) 26.4 - 33.4 pg    MCH Concentration 35.1 31.5 - 35.5 g/dL    Red Cell Distribution Width-SD 47.0 36.9 - 48.3 fL    Red Cell Distribution Width-CV 13.5 11.1 - 15.5 %    Platelet Count, Auto 202 143 - 398 x10E3/uL Mean Platelet Volume 10.7 9.3 - 13.0 fL    Nucleated RBC%, automated 0.0 No Ref. Range %    Absolute Nucleated RBC Count 0.00 0.00 - 0.00 x10E3/uL    Neutrophil Abs (Prelim) 2.08 See Absolute Neut Ct. x10E3/uL   Differential, Automated   Result Value Ref Range    Neutrophil Percent, Auto 55.3 No Ref. Range %    Lymphocyte Percent, Auto 29.8 No Ref. Range %    Monocyte Percent, Auto 10.6 No Ref. Range %    Eosinophil Percent, Auto 2.7 No Ref. Range %    Basophil Percent, Auto 1.1 No Ref. Range %    Immature Granulocytes% 0.5 No Reference Range %    Absolute Neut Count 2.08 1.80 - 6.90 x10E3/uL    Absolute Lymphocyte Count 1.12 (L) 1.30 - 3.40 x10E3/uL    Absolute Mono Count 0.40 0.20 - 0.80 x10E3/uL    Absolute Eos Count 0.10 0.00 - 0.50 x10E3/uL    Absolute Baso Count 0.04 0.00 - 0.10 x10E3/uL    Absolute Immature Gran Count 0.02 0.00 - 0.04 x10E3/uL   CBC & Auto Differential    Narrative    The following orders were created for panel order CBC & Auto Differential.  Procedure  Abnormality         Status                     ---------                               -----------         ------                     VOZ[[id number]]                          Abnormal            Final result               Differential, Automated[[id number]]      Abnormal            Final result                 Please view results for these tests on the individual orders.   Results for orders placed or performed in visit on 02/14/16   CBC   Result Value Ref Range    White Blood Cell Count 6.76 4.16 - 9.95 x10E3/uL    Red Blood Cell Count 3.70 (L) 3.96 - 5.09 x10E6/uL    Hemoglobin 13.3 11.6 - 15.2 g/dL    Hematocrit 42.5 95.6 - 45.2 %    Mean Corpuscular Volume 97.3 79.3 - 98.6 fL    Mean Corpuscular Hemoglobin 35.9 (H) 26.4 - 33.4 pg    MCH Concentration 36.9 (H) 31.5 - 35.5 g/dL    Red Cell Distribution Width-SD 44.6 36.9 - 48.3 fL    Red Cell Distribution Width-CV 13.1 11.1 - 15.5 % Platelet Count, Auto 254 143 - 398 x10E3/uL    Mean Platelet Volume 10.0 9.3 - 13.0 fL    Nucleated RBC%, automated 0.0 No Ref. Range %    Absolute Nucleated RBC Count 0.00 0.00 - 0.00 x10E3/uL    Neutrophil Abs (Prelim) 4.30 See Absolute Neut Ct. x10E3/uL   Differential, Automated   Result Value Ref Range    Neutrophil Percent, Auto 63.6 No Ref. Range %    Lymphocyte Percent, Auto 25.3 No Ref. Range %    Monocyte Percent, Auto 8.4 No Ref. Range %    Eosinophil Percent, Auto 1.8 No Ref. Range %    Basophil Percent, Auto 0.6 No Ref. Range %    Immature Granulocytes% 0.3 No Reference Range %    Absolute Neut Count 4.30 1.80 - 6.90 x10E3/uL    Absolute Lymphocyte Count 1.71 1.30 - 3.40 x10E3/uL    Absolute Mono Count 0.57 0.20 - 0.80 x10E3/uL    Absolute Eos Count 0.12 0.00 - 0.50 x10E3/uL    Absolute Baso Count 0.04 0.00 - 0.10 x10E3/uL    Absolute Immature Gran Count 0.02 0.00 - 0.04 x10E3/uL   CBC & Auto Differential    Narrative    The following orders were created for panel order CBC & Auto Differential.  Procedure                               Abnormality         Status                     ---------                               -----------         ------  EXB[[id number]]                          Abnormal            Final result               Differential, Automated[[id number]]      Normal              Final result                 Please view results for these tests on the individual orders.     Results for orders placed or performed in visit on 06/26/16   TSH with reflex FT4, FT3   Result Value Ref Range    TSH 1.3 0.3 - 4.7 mcIU/mL       No results found for: UAROUTINE  No components found for: HGA1C  Lab Results   Component Value Date    CRP <0.3 09/23/2016    CRP <0.5 05/19/2007     No results found for: TESTOSTOT  Lab Results   Component Value Date    SRWEST 6 09/23/2016    SRWEST 11 05/19/2007     No results found for: PSASCN Xr Cervical Spine Ap+lat+obl+odontoid W Flexion+extension (7 Views)    Result Date: 09/23/2016  IMPRESSION: C4-5 and C5-6 degenerative disc disease and uncovertebral arthritis. Mild instability. Signed byAddison Lank   09/23/2016 12:39 PM    US Thyroid-parathyroid    Result Date: 02/11/2017  IMPRESSION: Normal ultrasound of the thyroid. *From the American Thyroid Association 2015 Management Guidelines for Adult Patients with Thyroid Nodules: High suspicion nodules: Recommend FNA if > 1 cm (Strong recommendation, Moderate-quality evidence); Intermediate suspicion nodules: Recommend FNA if > 1 cm (Strong recommendation, Low-quality evidence); Low suspicion nodules: Recommend FNA if > 1.5 cm (Weak recommendation, Low-quality evidence); Very low suspicion nodules: Consider FNA if > 2 cm versus observation (Weak recommendation, Moderate-quality evidence); Benign nodules: No biopsy (Strong recommendation, Moderate-quality evidence). Dictated by: Cherylann Banas   02/11/2017 12:02 PM Signed by: Roanna Raider RAGAVENDRA   02/11/2017 12:56 PM    [place] Left Breast, Screening    Result Date: 12/12/2016  There is no mammographic or sonographic evidence of malignancy.  Routine screening mammogram in 1 year is recommended.  BI-RADS Category 2:   Benign Finding(s)  Report electronically signed by: Enzo Bi, M.D.    Report electronically signed by: Enzo Bi  12/12/2016 at 02:31:01 PM  Technologist: Margarette Canada                                                        [place] Right Breast, Screening    Result Date: 12/12/2016  There is no mammographic or sonographic evidence of malignancy.  Routine screening mammogram in 1 year is recommended.  BI-RADS Category 2:   Benign Finding(s)  Report electronically signed by: Enzo Bi, M.D.    Report electronically signed by: Enzo Bi  12/12/2016 at 02:31:01 PM  Technologist: Margarette Canada  Mammo Tomosynthesis, Screening, Bilat Breast Result Date: 12/12/2016  There is no mammographic or sonographic evidence of malignancy.  Routine screening mammogram in 1 year is recommended.  BI-RADS Category 2:   Benign Finding(s)  Report electronically signed by: Enzo Bi, M.D.    Report electronically signed by: Enzo Bi  12/12/2016 at 02:31:01 PM  Technologist: Margarette Canada                                                          ASSESSMENT & PLAN:     Diagnoses and all orders for this visit:    Skin lesion of face    Flaking of scalp    Seborrheic keratoses      The lesion at the left side of the nose could represent an actinic keratosis or perhaps an early squamous cell carcinoma.    I do not see any scalp abnormality today.    I do not see any sign of malignant or premalignant lesion at the back.    I fully counseled Ruweyda about the diagnoses, the natural history, the options for further workup and management, and the risks and potential benefits of each course of action.     Patient Instructions   Sun-protective behavior-    Whenever you will be out in the sun for more than 20 minutes:    Wear comfortable, breathable clothing to minimize your skin exposure to the sun.    Use sunscreen with SPF 30 + (Try to use titanium dioxide and/or zinc oxide products rather than chemical products; consider Cerave brand) on any exposed body parts (Do not get in eyes, mouth, mucus membranes). Re-apply after prolonged exposure and/or water immersion or sweating.    Wear a broad-brimmed hat to protect your head, face, neck, and eyes from the sun.    Wear sunglasses that provide complete UV-A and UV-B protection.    Try to minimize sun exposure, especially between the hours of 10 am and 4 pm when the sun's rays are the strongest. Utilize available sources of shade when possible.     Please follow up with the dermatologist asap as we discussed.         Questions related to this recommended plan were answered.      Ali Lowe, MD Associate Clinical Professor of Medicine  [employer] at [facility]

## 2017-08-05 ENCOUNTER — Ambulatory Visit: Payer: BLUE CROSS/BLUE SHIELD

## 2017-08-05 DIAGNOSIS — L570 Actinic keratosis: Secondary | ICD-10-CM

## 2017-08-05 DIAGNOSIS — Z87898 Personal history of other specified conditions: Secondary | ICD-10-CM

## 2017-08-05 DIAGNOSIS — L720 Epidermal cyst: Secondary | ICD-10-CM

## 2017-08-05 DIAGNOSIS — L821 Other seborrheic keratosis: Secondary | ICD-10-CM

## 2017-08-05 NOTE — Progress Notes
ESTABLISHED PATIENT PROGRESS NOTE    PATIENT: Robin Spencer  MRN: [id number]  DOB: February 16, 1951  DATE OF SERVICE: 08/05/2017    PRIMARY CARE PROVIDER: Pregler, Weyman Croon., MD  CHIEF COMPLAINT: lesion on left cheek    History of Present Illness: Robin Spencer is a 67 y.o. female who was seen in clinic for a full skin exam.     Last seen: 07/28/16 by Dr. Roslyn Smiling) of concern:  1. Crusted lesion on left medial cheek, denies any pain or bleeding   2. Scalp flaking after coloring and Brazilian blow out    Sunscreen use: yes in facial moisturizer  Skin cancer history: denies   History of atypical nevi: yes  Family history: denies any melanoma    Completed treatment for breast cancer Feb 2018. History of XRT to left breast.     Past Medical History:   Diagnosis Date   ??? Anxiety    ??? Breast cancer (HCC/RAF)    ??? Eczema    ??? Fibroids    ??? GERD (gastroesophageal reflux disease)     esophagitis   ??? History of migraine headaches    ??? History of radiation therapy 04/2016    L breast   ??? Otosclerosis    ??? Post-operative nausea and vomiting    ??? Rectal fissure    ??? Vitreous detachment      Allergies   Allergen Reactions   ??? Penicillins Other (See Comments) and Rash     Ampicillin, flesh rash in 1980s   ??? Menadiol Sodium Diphosphate Rash   ??? Other      [place] coloring on meds such as advil- not allergic to actual med- just the coloring   ??? Acetaminophen Rash       ROS:  Constitutional: Denies any fevers or unintentional weight loss.  Skin/hair/nails: Negative except as documented above.     Physical Exam:  General: Well-developed, well-nourished, no acute distress.  Psychiatric: Alert and oriented, pleasant, cooperative.     Skin:  Skin type: 2  Examination of the following skin areas was performed: scalp, hair, face, ears, eyelids, lips, neck, chest, breasts, axillae, abdomen, back, right upper extremity, left upper extremity, right lower extremity, left lower extremity and buttocks    Remarkable findings include: Left medial cheek near nasal ala with pink thin scaly plaque  Right medial cheek with whitish ~2 mm papule  Lower occipital scalp / nape of neck with erythema and scaling  Back with several [place] waxy papules  Inframammary folds with a few light [place] waxy papules     Assessment and Plan:    1. Actinic keratosis  - Discussed LN2 vs cream. Patient opts for LN2 today.   - The risks, benefits and alternatives of cryotherapy with liquid nitrogen were discussed with patient including pain, hives, persistent erythema, skin breakdown or blister, scar, hypo and hyperpigmentation. The patient gave verbal consent to proceed.   Number of lesions treated: 1  Site: left medial cheek  Number of cycles: 1  - Continue broad spectrum sun screen with at least spf 30 and additional photoprotection measures.   - If lesion persists, recurs or becomes symptomatic re-evaluate and consider biopsy.    2. Seborrheic keratosis    3. History of atypical nevus    4. Seborrheic dermatitis  - Patient uses triamcinolone 0.5% cream prn flares.     5. Milia    RTC: 6 weeks and annual full skin exam  Author:  Desiree Lucy. Lauralee Evener, MD 08/05/2017 1:17 PM

## 2017-08-13 MED ORDER — ESTRADIOL 10 MCG VA TABS
10 ug | ORAL_TABLET | VAGINAL | 1 refills | Status: AC
Start: 2017-08-13 — End: 2018-03-04

## 2017-09-09 ENCOUNTER — Ambulatory Visit: Payer: BC Managed Care – POS

## 2017-10-16 MED ORDER — ACYCLOVIR 400 MG PO TABS
400 mg | ORAL_TABLET | Freq: Two times a day (BID) | ORAL | 1 refills | Status: AC
Start: 2017-10-16 — End: 2018-04-16

## 2017-12-17 ENCOUNTER — Ambulatory Visit: Payer: BLUE CROSS/BLUE SHIELD | Attending: Rheumatology

## 2017-12-21 ENCOUNTER — Ambulatory Visit: Payer: BC Managed Care – POS

## 2017-12-21 ENCOUNTER — Ambulatory Visit: Payer: BLUE CROSS/BLUE SHIELD

## 2017-12-21 DIAGNOSIS — R928 Other abnormal and inconclusive findings on diagnostic imaging of breast: Secondary | ICD-10-CM

## 2017-12-29 ENCOUNTER — Institutional Professional Consult (permissible substitution): Payer: BLUE CROSS/BLUE SHIELD

## 2017-12-29 DIAGNOSIS — R928 Other abnormal and inconclusive findings on diagnostic imaging of breast: Secondary | ICD-10-CM

## 2017-12-30 ENCOUNTER — Inpatient Hospital Stay: Payer: BC Managed Care – POS | Attending: Radiation Oncology

## 2017-12-30 DIAGNOSIS — R928 Other abnormal and inconclusive findings on diagnostic imaging of breast: Secondary | ICD-10-CM

## 2017-12-30 LAB — Glomerular Filtration Rate Estimate: CREATININE: 0.74 mg/dL (ref 0.60–1.30)

## 2017-12-30 MED ADMIN — GADOBUTROL 1 MMOL/ML IV SOLN: 7.5 mL | INTRAVENOUS | @ 18:00:00 | Stop: 2017-12-30 | NDC 50419032511

## 2017-12-31 ENCOUNTER — Ambulatory Visit: Payer: BLUE CROSS/BLUE SHIELD

## 2017-12-31 ENCOUNTER — Telehealth: Payer: BC Managed Care – POS

## 2017-12-31 NOTE — Telephone Encounter
Pt informed that her recent imaging is normal per Dr. McCloskey. Pt has a scheduled follow up with Dr. McCloskey on April 2020.

## 2018-01-12 ENCOUNTER — Ambulatory Visit: Payer: BLUE CROSS/BLUE SHIELD

## 2018-02-26 ENCOUNTER — Ambulatory Visit: Payer: BC Managed Care – POS

## 2018-02-26 DIAGNOSIS — L578 Other skin changes due to chronic exposure to nonionizing radiation: Secondary | ICD-10-CM

## 2018-02-26 DIAGNOSIS — L720 Epidermal cyst: Secondary | ICD-10-CM

## 2018-02-26 DIAGNOSIS — L814 Other melanin hyperpigmentation: Secondary | ICD-10-CM

## 2018-02-26 DIAGNOSIS — B078 Other viral warts: Secondary | ICD-10-CM

## 2018-02-26 DIAGNOSIS — L821 Other seborrheic keratosis: Secondary | ICD-10-CM

## 2018-02-26 DIAGNOSIS — L570 Actinic keratosis: Secondary | ICD-10-CM

## 2018-02-26 NOTE — Patient Instructions
Here at [facility] for cosmetics - Dr. Clotilde Dieter- [phone] - Ilda Mori; Dr. Rhina Brackett [facility]    Dr. Ernie Avena [facility]    Dr. Shela Nevin [facility] (out on leave for several months)    Dr. Harl Bowie [facility] (will be out on leave for several months)    Dr. Lianne Cure [facility]    $150 consultation fee, not covered by insurance, it cannot be waived even if you choose to pursue a cosmetic procedure.    Outside [place] - Dr. Jacklynn Barnacle (works with Dr. Howard Pouch) - [place] ~ Phone: [phone]     In [facility] - Dr. Lattie Haw Chipps or Dr. Delfina Redwood, Dr. Tamsen Meek, etc. (part of a practice - Drs. Pennie Banter, and Ringling)

## 2018-02-26 NOTE — Progress Notes
PATIENT: Robin Spencer  MRN: [id number]  DOB: 04/08/1950  DATE OF SERVICE: 02/26/2018    REFERRING PRACTITIONER: Self-Referral  PRIMARY CARE PROVIDER: Pregler, Weyman Croon., MD  CHIEF COMPLAINT:   Chief Complaint   Patient presents with   ??? Skin check consult       Subjective:      Robin Spencer is a 67 y.o. year old female who was seen in clinic for skin check, various lesions on face and chest and arms.  Last seen by Dr. Lauralee Evener 07/2017.  H/o actinic keratosis, seborrheic dermatitis,     Various growths. Also wondering about laser treatments or other treatments for chest and arm lesions.    Past Medical History:   Diagnosis Date   ??? Anxiety    ??? Breast cancer (HCC/RAF)    ??? Eczema    ??? Fibroids    ??? GERD (gastroesophageal reflux disease)     esophagitis   ??? History of migraine headaches    ??? History of radiation therapy 04/2016    L breast   ??? Otosclerosis    ??? Post-operative nausea and vomiting    ??? Rectal fissure    ??? Vitreous detachment      Past Surgical History:   Procedure Laterality Date   ??? BREAST BIOPSY  1986    Fibroadenoma, ductal hyperplasia   ??? Laparoscopy for subfertility     ??? Stapedectomy, right       Family History   Problem Relation Age of Onset   ??? Hypertension Father    ??? Stroke Father    ??? Hyperlipidemia Father    ??? Depression Father    ??? Prostate cancer Father 81   ??? Hypertension Mother    ??? Asthma Mother    ??? Endometrial cancer Mother 39   ??? Hyperthyroidism Mother    ??? Prostate cancer Brother 78   ??? Breast cancer Neg Hx    ??? Ovarian cancer Neg Hx    ??? Malignant hypertension Neg Hx      Social History     Socioeconomic History   ??? Marital status: Married     Spouse name: Not on file   ??? Number of children: Not on file   ??? Years of education: Not on file   ??? Highest education level: Not on file   Occupational History   ??? Not on file   Social Needs   ??? Financial resource strain: Not on file   ??? Food insecurity:     Worry: Not on file     Inability: Not on file   ??? Transportation needs: Medical: Not on file     Non-medical: Not on file   Tobacco Use   ??? Smoking status: Never Smoker   ??? Smokeless tobacco: Never Used   Substance and Sexual Activity   ??? Alcohol use: No     Alcohol/week: 0.0 oz   ??? Drug use: No   ??? Sexual activity: Yes     Partners: Male     Birth control/protection: None   Lifestyle   ??? Physical activity:     Days per week: Not on file     Minutes per session: Not on file   ??? Stress: Not on file   Relationships   ??? Social connections:     Talks on phone: Not on file     Gets together: Not on file     Attends religious service: Not on file  Active member of club or organization: Not on file     Attends meetings of clubs or organizations: Not on file     Relationship status: Not on file   Other Topics Concern   ??? Not on file   Social History Narrative    Patient is married, and has two children. She is of Ashkenazi Agricultural engineer.        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 acyclovir 400 mg tablet Take 1 tablet (400 mg total) by mouth two (2) times daily.   ??? BIOTIN PO Take 1 tablet by mouth daily .     ??? CALCIUM CITRATE PO Take 630 mg by mouth daily.   ??? Cholecalciferol (VITAMIN D3) 5000 units CAPS Take 1 capsule by mouth daily.   ??? cyanocobalamin 500 mcg tablet Take 500 mcg by mouth daily.   ??? estradiol (VAGIFEM) 10 mcg vaginal tablet Place 1 tablet (10 mcg total) vaginally two (2) times a week.   ??? MAGNESIUM CITRATE PO Take 150 mg by mouth two (2) times daily.   ??? Omega-3 Fatty Acids (FISH OIL PO) Take 1,400 mg by mouth two (2) times daily.   ??? tamoxifen 20 mg tablet Take 1 tablet (20 mg total) by mouth daily.   ??? triamcinolone 0.5% cream Apply topically as needed for.   ??? UNABLE TO FIND Med Name: [place] supplement- Epimedium, Tumeric, Japanese, Fleeceflower, EPA/DHA .     Allergies   Allergen Reactions ??? Penicillins Other (See Comments) and Rash     Ampicillin, flesh rash in 1980s   ??? Menadiol Sodium Diphosphate Rash   ??? Other      [place] coloring on meds such as advil- not allergic to actual med- just the coloring   ??? Acetaminophen Rash     Review of Systems:  Constitutional: negative for chills and fevers  Skin:  otherwise negative      Objective:        General:   alert, appears stated age and cooperative     Neurologic:   Grossly normal   Psychiatric:   oriented to time, place and person, mood and affect are within normal limits     Skin Exam:  Skin Type: 2  Total body skin exam performed as listed below.  Total body skin exam includes scalp, head/face, conjunctivae/lids, lips/gums/teeth, neck, chest/breasts/axillae, back, abdomen, groin/genitalia/buttocks, left upper extremity, right upper extremity, left lower extremity, right lower extremity, digits/nails  All areas examined were within normal limits with the following exceptions: several white 1-70mm nodules on face.   Over face, body, many waxy stuck on appearing papules.   Left upper arm with verrucous papule.     Upper back and right dorsal foot with rough thin pink scaly papules     Diffuse tan macules with regular appearance.        Assessment:           Plan/ Recommendation:        Diagnoses and all orders for this visit:    Actinic keratosis  -     Destruction Benign/PreMalignant First Lesion  -     Destruction Benign/PreMalignant, 2-14 Lesions  Cryotherapy performed with liquid  nitrogen after verbal consent obtained. Risks/benefits/alternatives of therapy discussed including blistering, scar, pigment change, pain, recurrence.    Number of lesions: 2  Site(s): upper back and right dorsal foot  Number of cycles: 1-2    The patient tolerated the procedure well with no immediate complications.     Seborrheic keratosis - reassurance     Sun-damaged skin - yearly skin exam recommended    Lentigo - reassurance - cosmetic treatment if desired. Milia - cosmetic removal if desired    Other viral warts  -     Destruction Benign Lesions up to 14  Cryotherapy performed with liquid nitrogen after verbal consent obtained. Risks/benefits/alternatives of therapy discussed including blistering, scar, pigment change, pain, recurrence.    Number of lesions: 1  Site(s): left upper arm  Number of cycles: 2    The patient tolerated the procedure well with no immediate complications.        Assessment and plan were discussed with the patient. Counseled patient regarding details of diagnosis and prognosis.    Follow up: 1 year or sooner prn      Author:  Arbutus Ped 02/26/2018 2:22 PM

## 2018-03-01 ENCOUNTER — Ambulatory Visit: Payer: BLUE CROSS/BLUE SHIELD | Attending: Vascular Surgery

## 2018-03-01 DIAGNOSIS — I8390 Asymptomatic varicose veins of unspecified lower extremity: Secondary | ICD-10-CM

## 2018-03-01 NOTE — Patient Instructions
We will get the procedure scheduled for you. Let me know if any issues!

## 2018-03-01 NOTE — Progress Notes
Here for F/U of her VVE's. Was having trouble with hot flashes, so put off treatment after I saw her last August (2018). She has one large branch of the left leg (pop area) that would be best served by stabs. The rest could have sclero. We reviewed the rationale for this. She would like to go ahead. Will set it up for her.

## 2018-03-03 MED ORDER — ESTRADIOL 10 MCG VA TABS
10 ug | ORAL_TABLET | VAGINAL | 1 refills | Status: AC
Start: 2018-03-03 — End: 2018-08-13

## 2018-03-29 ENCOUNTER — Ambulatory Visit: Payer: BLUE CROSS/BLUE SHIELD | Attending: Vascular Surgery

## 2018-03-29 DIAGNOSIS — I781 Nevus, non-neoplastic: Secondary | ICD-10-CM

## 2018-03-29 NOTE — Progress Notes
21ml (2cc) of Polidocanol Solution 0.75% was administered to the left leg, by Dr Burnett Kanaris.  The procedure was tolerated well.  Discharge and follow up instructions were given.

## 2018-04-08 ENCOUNTER — Ambulatory Visit: Payer: BC Managed Care – POS | Attending: Rheumatology

## 2018-04-08 DIAGNOSIS — R635 Abnormal weight gain: Secondary | ICD-10-CM

## 2018-04-08 DIAGNOSIS — M19041 Primary osteoarthritis, right hand: Secondary | ICD-10-CM

## 2018-04-08 DIAGNOSIS — M810 Age-related osteoporosis without current pathological fracture: Secondary | ICD-10-CM

## 2018-04-08 DIAGNOSIS — M6740 Ganglion, unspecified site: Secondary | ICD-10-CM

## 2018-04-08 DIAGNOSIS — M797 Fibromyalgia: Secondary | ICD-10-CM

## 2018-04-08 DIAGNOSIS — M19042 Primary osteoarthritis, left hand: Secondary | ICD-10-CM

## 2018-04-08 MED ORDER — CYCLOBENZAPRINE HCL 5 MG PO TABS
5 mg | ORAL_TABLET | Freq: Every evening | ORAL | 5 refills | Status: AC
Start: 2018-04-08 — End: 2018-10-08

## 2018-04-08 NOTE — Patient Instructions
- Referred to Clinical Nutrition, call [phone] to make appointment with Dr. Josiah Lobo for weight loss.     - Check DEXA for Osteoporosis     - Encouraged patient to follow with Dr. Selena Batten for Osteoporosis management after the DEXA scan    - Referral to Dr. Augusto Garbe, hand specialist, for evaluation and treatment of L finger mucoid cyst  Primary Office  [facility]  [address] [place]  [address]  [place], [place] [address]  Phone [phone]  Fax [phone]    I sent message below:   Dear Dr. Augusto Garbe,  I am referring this pleasant patient to you for a L finger mucoid cyst. I hope you can see her.   Thank you!    - Consider low dose flexeril 5 mg QHS for myofascial neck pain. Patient to let me know if interested and I can sent the prescription     - Follow with Dr. Lorenza Chick for vitamin D supplementation recommendations  ??????  - Continue magnesium citrate 400 mg daily and calcium citrate 500 mg in combination with 250 mg daily.  ???  - Continue turmeric, ginger, and fish oil supplementation     - Patient can try OTC lemon balm tea/tincture/capsule to help with tension and stress. Please follow the manufacturer's directions.    - I discussed with patient at length need of regular exercise, weight management, healthy eating habits such as avoiding processed foods, artificially flavored and sweetened beverages, and methods of coping with psychosocial stressors    RTC 6 months PRN      Dr. Gerhard Munch???s Anti-Inflammatory Diet And Food Pyramid      Healthy Sweets  HOW MUCH: Sparingly  HEALTHY CHOICES: Unsweetened dried fruit, dark chocolate, fruit sorbet  WHY: Dark chocolate provides polyphenols with antioxidant activity. Choose dark chocolate with at least 70 percent pure  cocoa and have an ounce a few times a week. Fruit sorbet is a better option than other frozen desserts.    Red Wine  How much: Optional, no more than 1 to 2 glasses per day  Healthy choices : Organic red wine Why: Red wine has beneficial antioxidant activity. Limit intake to no more than 1 to 2 servings per day. If you do not drink  alcohol, do not start.    Supplements  How much: Daily  Healthy choices : High quality multivitamin/multimineral that includes key antioxidants (vitamin C, vitamin E, mixed carotenoids,  and selenium); coenzyme Q10; 2 to 3 grams of a molecularly distilled fish oil; 2,000 IU of vitamin D3  Why: Supplements help fill gaps in your diet when you are unable to get your daily requirement of micronutrients. Learn more  about supplements and get your free recommendation.    Tea  How much: 2 to 4 cups per day  Healthy choices : White, green, oolong teas  Why: Tea is rich in catechins, antioxidant compounds that reduce inflammation. Purchase high-quality tea and learn how to  brew it correctly for maximum taste and health benefits.    Healthy Herbs And Spices  How much: Unlimited amounts  Healthy choices : Turmeric, curry powder (which contains turmeric), ginger and garlic (dried and fresh), chili peppers, basil,  cinnamon, rosemary, thyme  Why: Use these herbs and spices generously to season foods. Turmeric and ginger are powerful natural anti-inflammatory agents.    Other Sources Of Protein  How much: 1 to 2 servings a week (one portion is equal to 1 ounce of cheese, one 8-ounce  serving of dairy, 1 egg, or 3  ounces cooked poultry or skinless meat)  Healthy choices : High-quality natural cheese and yogurt, omega-3 enriched eggs, skinless poultry, grass-finished lean meats  Why: In general, try to reduce consumption of animal foods. If you eat chicken, choose organic, cage-free chicken and  remove the skin and associated fat. Use organic, high-quality dairy products moderately, primarily yogurt and natural cheeses  such as Emmental (Swiss), Tea, and true [place]. If you eat eggs, choose omega-3-enriched eggs (from hens that  are fed a flax-meal-enriched diet) or organic eggs from free-range chickens.    Cooked Asian Mushrooms  How much: Unlimited amounts  Healthy choices : Shiitake, enokitake, maitake, oyster mushrooms (and wild mushrooms if available)  Why: These mushrooms contain compounds that enhance immune function. Never eat mushrooms raw, and minimize  consumption of common commercial button mushrooms (including cremini and Portobello).    Whole-Soy Foods  How much: 1 to 2 servings per day (one serving is equal to ??? cup tofu or tempeh, 1 cup soy milk, ??? cup cooked edamame,  or 1 ounce of soynuts)  Healthy choices : Tofu, tempeh, edamame, soy nuts, soymilk  Why: Soy foods contain isoflavones that have antioxidant activity and are protective against cancer. Choose whole-soy  foods over fractionated foods like isolated soy-protein powders and imitation meats made with soy isolate.    Fish And Shellfish  How much: 2 to 6 servings per week (one serving is equal to 4 ounces of fish or seafood)  Healthy choices : Wild [place] salmon (especially sockeye), herring, sardines, and black cod (sablefish)  2  Why: These fish are rich in omega-3 fats, which are strongly anti-inflammatory. If you choose not to eat fish, take a  molecularly distilled fish-oil supplement that provides both EPA and DHA in a dose of 2-3 grams per day.    Healthy Fats  How much: 5 to 7 servings per day (one serving is equal to 1 teaspoon of oil, 2 walnuts, 1 tablespoon of flaxseed, 1 ounce  of avocado)  Healthy choices : For cooking, use extra-virgin olive oil and expeller-pressed grapeseed oil. Other sources of healthy  fats include nuts (especially walnuts), avocados, and seeds, including hemp seeds and freshly ground flaxseed. Omega-3  fats are also found in cold-water fish, omega-3 enriched eggs, and whole-soy foods. Organic, expeller-pressed, high-oleic  sunflower or safflower oils may also be used, as well as walnut and hazelnut oils in salads and dark roasted sesame oil as  a flavoring for soups and stir-fries. Why: Healthy fats are those rich in either monounsaturated or omega-3 fats. Extra-virgin olive oil is rich in polyphenols with  antioxidant activity.    Whole And Cracked Grains  How much: 3 to 5 servings a day (one serving is equal to about ??? cup of cooked grains)  Healthy choices : [place] rice, basmati rice, wild rice, buckwheat groats, barley, quinoa, steel-cut oats  Why: Whole grains digest slowly, reducing frequency of spikes in blood sugar that promote inflammation. Whole grains  means grains that are intact or in a few large pieces, not whole-wheat bread or other products made from flour.    Pasta (Al Dente)  How much: 2 to 3 servings per week (one serving is equal to about ??? cup cooked pasta)  Healthy choices : Organic pasta, rice noodles, bean-thread noodles, and part whole-wheat and buckwheat noodles like  Japanese udon and soba  Why: Pasta cooked al dente (when it has ???tooth??? to it) has a  lower glycemic index than fully cooked pasta. Low-glycemicload  carbohydrates should be the bulk of your carbohydrate intake to help minimize spikes in blood glucose levels.    Beans And Legumes  How much: 1 to 2 servings per day (one serving is equal to ??? cup of cooked beans or legumes)  Healthy choices : Beans like Anasazi, adzuki and black, as well as chickpeas, black-eyed peas and lentils  Why: Beans are rich in folic acid, magnesium, potassium and soluble fiber. They are a low-glycemic-load food. Eat them  well cooked either whole or pureed into spreads like hummus.    Vegetables  How much: 4 to 5 servings per day minimum (one serving is equal to 2 cups salad greens or ??? cup vegetables cooked,  raw, or juiced)  Healthy choices : Lightly cooked dark leafy greens (spinach, collard greens, kale, Swiss chard), cruciferous vegetables  (broccoli, cabbage, Brussels sprouts, kale, bok choy and cauliflower), carrots, beets, onions, peas, squashes, sea  vegetables and washed raw salad greens Why: Vegetables are rich in flavonoids and carotenoids with both antioxidant and anti-inflammatory activity. Go for a wide  range of colors, eat them both raw and cooked, and choose organic when possible.    Fruits  How much: 3 to 4 servings per day (one serving is equal to 1 medium-size piece of fruit, ??? cup chopped fruit, ??? cup of  dried fruit)  Healthy choices : Raspberries, blueberries, strawberries, peaches, nectarines, oranges, pink grapefruit, red grapes, plums,  pomegranates, blackberries, cherries, apples, and pears - all lower in glycemic load than most tropical fruits  Why: Fruits are rich in flavonoids and carotenoids with both antioxidant and anti-inflammatory    Water  How much: Throughout the day  Healthy choices : Drink pure water, or drinks that are mostly water (unsweetened tea, very diluted fruit juice, sparkling  water with lemon)  Why: Water is vital for overall functioning of the body.      MEDICATION: CYCLOBENZAPRINE   Cyclobenzaprine (brand: Flexeril) is a sedative and muscle relaxant medication. It is intended to be used along with rest, heat or ice packs, and other measures to relieve acute muscle spasm.  DIRECTIONS FOR USE:  Cyclobenzaprine ???may be taken on an empty stomach or with food. Take the medicine at regular intervals. If the label says ???every 8 hours???, this means 3 times per day. Doses don't have to be exactly 8 hours apart, but you should take 3 doses a day. This medicine should not be taken daily for more than 3 weeks without consulting your doctor.  ???WHAT TO WATCH FOR:  POSSIBLE SIDE EFFECTS: Drowsiness, dizziness (Take it less often and contact your doctor if symptoms persist). Dry mouth (Chew gum or suck on hard candy). Constipation (Contact your doctor). Difficulty passing urine; seizure (Stop the medicine and contact your doctor).  MEDICAL CONDITIONS: Before starting this medicine, be sure your doctor knows if you have any of the following conditions: ??? Pregnancy or breastfeeding, sleep apnea  ??? Glaucoma, prostate enlargement, seizure disorder, asthma, or lung disease  DRUG INTERACTIONS: Before starting this medicine, be sure your doctor knows if you are taking any of the following drugs:  ??? MAO inhibitors within the last 14 days [Nardil (phenelzine), Parnate (tranylcypromine)];  ??? Tricyclic antidepressants [amitriptyline (Elavil), doxepin (Sinequan), imipramine (Tofranil), and others],  ??? Phenothiazines: Thorazine, Haldol, Mellaril, Compazine, and others  ??? Antihistamines Benadryl, Vistaril, Atarax and others; Bentyl, Donnatal, Librax, Pro-Banthine,  ??? Cystospaz, Cogentin, Artane, Combivent, or Atrovent inhalers; anti-Parkinson drugs  WARNINGS:  ??? Do not drive, ride a bicycle, or operate dangerous equipment while taking this medicine until you know how it will affect you.  ??? May cause excess drowsiness when taken with alcohol, muscle relaxant, sedative, or pain medicine. Use with caution.  ??? May cause thickened mucus and worsening of asthma, COPD, or emphysema. Use with caution.  [NOTE: This information topic may not include all directions, precautions, medical conditions, drug/food interactions and warnings for this drug. Check with your doctor, nurse, or pharmacist for any questions that you may have.]  ??? [address] [place], [place] [address]. All rights reserved. This information is not intended as a substitute for professional medical care. Always follow your healthcare professional's instructions.MEDICATION: CYCLOBENZAPRINE   Cyclobenzaprine (brand: Flexeril) is a sedative and muscle relaxant medication. It is intended to be used along with rest, heat or ice packs, and other measures to relieve acute muscle spasm.  DIRECTIONS FOR USE:  Cyclobenzaprine ???may be taken on an empty stomach or with food. Take the medicine at regular intervals. If the label says ???every 8 hours???, this means 3 times per day. Doses don't have to be exactly 8 hours apart, but you should take 3 doses a day. This medicine should not be taken daily for more than 3 weeks without consulting your doctor.  ???WHAT TO WATCH FOR:  POSSIBLE SIDE EFFECTS: Drowsiness, dizziness (Take it less often and contact your doctor if symptoms persist). Dry mouth (Chew gum or suck on hard candy). Constipation (Contact your doctor). Difficulty passing urine; seizure (Stop the medicine and contact your doctor).  MEDICAL CONDITIONS: Before starting this medicine, be sure your doctor knows if you have any of the following conditions:  ??? Pregnancy or breastfeeding, sleep apnea  ??? Glaucoma, prostate enlargement, seizure disorder, asthma, or lung disease  DRUG INTERACTIONS: Before starting this medicine, be sure your doctor knows if you are taking any of the following drugs:  ??? MAO inhibitors within the last 14 days [Nardil (phenelzine), Parnate (tranylcypromine)];  ??? Tricyclic antidepressants [amitriptyline (Elavil), doxepin (Sinequan), imipramine (Tofranil), and others],  ??? Phenothiazines: Thorazine, Haldol, Mellaril, Compazine, and others  ??? Antihistamines Benadryl, Vistaril, Atarax and others; Bentyl, Donnatal, Librax, Pro-Banthine,  ??? Cystospaz, Cogentin, Artane, Combivent, or Atrovent inhalers; anti-Parkinson drugs  WARNINGS:  ??? Do not drive, ride a bicycle, or operate dangerous equipment while taking this medicine until you know how it will affect you.  ??? May cause excess drowsiness when taken with alcohol, muscle relaxant, sedative, or pain medicine. Use with caution.  ??? May cause thickened mucus and worsening of asthma, COPD, or emphysema. Use with caution.  [NOTE: This information topic may not include all directions, precautions, medical conditions, drug/food interactions and warnings for this drug. Check with your doctor, nurse, or pharmacist for any questions that you may have.] ??? [address], [place], [place] [address]. All rights reserved. This information is not intended as a substitute for professional medical care. Always follow your healthcare professional's instructions.

## 2018-04-08 NOTE — Progress Notes
Pt is a 68 y.o. female who presents today for:  Chief Complaint   Patient presents with   ??? Follow-up     Sarcoidosis       Subjective:      Last visit 06/15/2017:      Since last visit patient has been doing better. She still has some achiness in the neck, shoulder girdle, but better with PT. She also added OTC magnesium supplementation and she is sleeping better. She tried CBT with Alesia Morin and saw her for one visit and got some advice regarding sleep hygiene.    Interval history 04/08/2018:    Patient says ''I'm basically very good.'' She has been sleeping much better and credits magnesium 250 mg qd supplementation. She found wearing an eye mask blocks the light at night.     She finds her supplement with fish oil, omega 3 fatty acids, and turmeric have helped her joint aches and pains ''a lot.''     Still experience hypersensitivity to light touch     Water PT didn't provide much relief.    Pilates and restorative yoga has helped her  myofascial pain. Still has residual neck, upper back, and shoulder stiffness     Has noticed she has reached her highest weight.     Body mass index is 22.93 kg/m???.      I reviewed and discussed medical records with patient below:  the DXA scan (bone mineral density) examination results are as follows:  ???  Assessment:  1. The left hip and femoral neck T-score's of -2.2 and -2.3 respectively, meet the criteria for osteopenia.  2. The PA lumbar spine T-score of -2.7 meets the criteria for osteoporosis according to the [facility] classification.      I reviewed and discussed results with patient below:   Last Recorded Vital Signs:    04/08/18 1021   BP: 119/70   Pulse: 73   Resp: 18   Temp: 36.9 ???C (98.4 ???F)   SpO2: 98%     Lab Results   Component Value Date    CRP <0.3 09/23/2016    CRP <0.5 05/19/2007    SRWEST 6 09/23/2016    SRWEST 11 05/19/2007     Lab Results   Component Value Date    VITD25OH 35 05/08/2017    VITD25OH 42 02/20/2017     ROS No Yes   No Yes    No Yes Fever [x]   []  Vision loss [x]   []  Unexpected bleeding [x]   []       Impression:     - Fibromyalgia   []  controled  []  low activity []  moderate activity []  highly active   - H/o migraine headaches before menopause.    []  controlled  []  low activity []  moderate activity []  highly active   - L finger mucoid cyst, referred to orthopedics   []  controled            []  low activity           []  moderate activity []  highly active         - Osteoarthritis hands, doing home exercises with no significant pain   []  controled            []  low activity           []  moderate activity []  highly active         - Neck pain, likely combination of myofascial pain and spondylosis, 09/2016 xray cervical spine shows DDD  cervical spine    []  controled            []  low activity           []  moderate activity []  highly active         - Osteoporosis, under the care of Dr. Lorenza Chick   []  controled            []  low activity           []  moderate activity []  highly active         - Insomnia, improvement with OTC magnesium supplementation    []  controled            []  low activity           []  moderate activity []  highly active      Plan:   ???  - Referred to Clinical Nutrition, call [phone] to make appointment with Dr. Josiah Lobo for weight loss.     - Check DEXA for Osteoporosis     - Encouraged patient to follow with Dr. Selena Batten for Osteoporosis management after the DEXA scan    - Referral to Dr. Augusto Garbe, hand specialist, for evaluation and treatment of L finger mucoid cyst  Primary Office  [facility]  [address] [place]  [address]  [place], [place] [address]  Phone [phone]  Fax [phone]    I sent message below:   Dear Dr. Augusto Garbe,  I am referring this pleasant patient to you for a L finger mucoid cyst. I hope you can see her.   Thank you!    - Consider low dose flexeril 5 mg QHS for myofascial neck pain. Patient to let me know if interested and I can sent the prescription - Follow with Dr. Lorenza Chick for vitamin D supplementation recommendations  ??????  - Continue magnesium citrate 400 mg daily and calcium citrate 500 mg in combination with 250 mg daily.  ???  - Continue turmeric, ginger, and fish oil supplementation     - Patient can try OTC lemon balm tea/tincture/capsule to help with tension and stress. Please follow the manufacturer's directions.    - I discussed with patient at length need of regular exercise, weight management, healthy eating habits such as avoiding processed foods, artificially flavored and sweetened beverages, and methods of coping with psychosocial stressors    RTC 6 months PRN    Orders Placed This Encounter   ??? DXA lumbar spine+hip     Standing Status:   Future     Standing Expiration Date:   04/09/2019     Order Specific Question:   Reason for exam:     Answer:   Osteoporosis     Order Specific Question:   Preferred appt site?     Answer:   [id number])     Order Specific Question:   I authorize the Radiologist to modify the parameters of this test as medically necessary based on the clinical indications for the study. This includes the administration of contrast.     Answer:   Yes   ??? Referral to Clinical Nutrition     Referral Priority:   Routine     Referral Type:   Consultation and/or Follow-Up     Referral Reason:   Specialty Services Required     Number of Visits Requested:   1   ??? Referral to Orthopaedic Surgery, Hand     Referral Priority:   Routine  Referral Type:   Consultation and/or Follow-Up     Referral Reason:   Specialty Services Required     Number of Visits Requested:   1   ??? cyclobenzaprine 5 mg tablet     Sig: Take 1 tablet (5 mg total) by mouth at bedtime.     Dispense:  30 tablet     Refill:  5     I discussed with patient at length need of regular exercise, weight management and methods of coping with psychosocial stressors.   I reviewed the plan of care in detail with patient. Patient had the opportunity to ask questions and expressed satisfaction with the plan of care.  The benefits and risks of medications were also explained in detail.    []  If box is checked, patient advised to monitor CBC (white blood cell count, hemoglobin, platelet count), liver enzyme levels (AST, ALT), and kidney function (creatinine) every 3 months for possible __ side effects.    []  If box is checked, patient is taking plaquenil (hydroxychloroquine), and advised to schedule retinal exam/eye exam by ophthomology once a year.     []  Total of  __  minutes spent on the visit, with    >50% of time spent directly counseling the patient.     See A/P section for content of discussion    Procedures:         Past Medical History:   No specialty comments available.    Patient Active Problem List    Diagnosis Date Noted   ??? Asymptomatic varicose veins 03/01/2018   ??? Vitreous floater, bilateral 06/11/2017     No retinal detachment, retinal tear, or vitreous hemorrhage on dilated fundus exam. Signs and symptoms reviewed.     ??? Increased PTH level 05/08/2017     Sees Dr. Selena Batten in endocrinology.      ??? Syncope and collapse 06/25/2016     2018, ECG/Labs/MRI w/o gad unrevealing, saw neurology Dr. Lorina Rabon, plan; EEG, followup      ??? Immune to measles 05/09/2016     (+) Ab 2018     ??? Invasive ductal carcinoma of left breast, ER+PR+HER2- 01/29/2016     2017, Bilateral tomosynthesis screening mammogram on 12/11/2015. There were no suspicious findings seen. She had postsurgical changes in both breasts. Patient then developed bilateral breast pain, left greater than right. She was sent for bilateral diagnostic ultrasounds on 01/15/2016. The right breast was negative. However, in the left breast, there was an irregular mass seen with indistinct margins, measuring 5 x 6 x 4 mm, at 12 o'clock, 6 cm from the nipple. Her left axilla was negative. Genetic testing: NO MUTATION DETECTED/This panel tests three Founder Mutations in the BRCA1/2 genes (BRCA1 187delAG, BRCA1 5385insC, BRCA2 6174delT; lumpectomy: sentinal node negative x 2.  TAM recommended by oncology, annual endometrial surveillance given history by GYN.      ??? Breast pain 01/09/2016     [facility] plan per NP, imaging, lifestyle changes     ??? Cataract, nuclear sclerotic, both eyes 11/29/2015     Not yet visually significant. Continue observation.      ??? Status post LASIK surgery of both eyes 11/29/2015     Performed by Dr. Ellin Goodie (2000 ?). Originally set with left eye for distance and low myopia for right.     ??? Fibroid 04/09/2015     2017 on ultrasound.      ??? BPPV (benign paroxysmal positional vertigo) 07/19/2013   ???  Menopause 04/14/2013   ??? Seborrheic dermatitis 05/11/2012   ??? 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 PPD positive 04/07/2012   ??? Palpitations 04/07/2012     2009 ER PACs, PVCs, sinus arrythmia, ECHO, Stress ECHO WNL 2009     ??? OA (osteoarthritis) 04/07/2012     Hands 2011, anti CCP neg, no evidence inflammation     ??? Visit for preventive health examination 04/07/2012     Colo noscopy= hyperplastic polyps 2017     ??? Hearing loss, right 04/07/2012     Otosclerosis, s/p stapedectomy     ??? Eye allergies 01/08/2012     Not having significant symptoms at this time, but could use over the counter anti-histamine eye drops, like ketotifen or artificial tears as needed if symptoms recur.     ??? Osteoporosis 11/10/2011     DEXA 2013, spine-2.4, femoral neck -2.9. On HT for menopause sx and as osteoporosis rx.; PA lumbar spine T-score of -2.7, will begin TAM per oncology.          Past Medical History relevant to rheumatology:         Current Medications Relevant to Rheumatology:     Rheum Meds       Disp Refills Start End    tamoxifen 20 mg tablet 90 tablet 3 03/23/2017     Sig - Route: Take 1 tablet (20 mg total) by mouth daily. - Oral    cyclobenzaprine 5 mg tablet 30 tablet 5 04/08/2018 04/08/2019 Sig - Route: Take 1 tablet (5 mg total) by mouth at bedtime. - Oral        Rheum Meds Other     Oil Soluble Vitamins       Cholecalciferol (VITAMIN D3) 5000 units CAPS    Take 1 capsule by mouth daily.    Calcium       CALCIUM CITRATE PO    Take 630 mg by mouth daily.           Allergies   Allergen Reactions   ??? Penicillins Other (See Comments) and Rash     Ampicillin, flesh rash in 1980s   ??? Menadiol Sodium Diphosphate Rash   ??? Other      Red [place] coloring on meds such as advil- not allergic to actual med- just the coloring   ??? Acetaminophen Rash       Physical Exam:   BP 119/70  ~ Pulse 73  ~ Temp 36.9 ???C (98.4 ???F) (Tympanic)  ~ Resp 18  ~ Ht 5' 4.5'' (1.638 m)  ~ Wt 135 lb 11.2 oz (61.6 kg)  ~ LMP  (LMP Unknown)  ~ SpO2 98%  ~ BMI 22.93 kg/m???   Checked Box= body part examined.  Abnormalities noted are listed next to the body part.   NL AbNL  Comment  NL AbNL Comment   General [x]  []   ENT      Eyes       Oropharynx []   []         Conjunct/Sclera [x]   []       Nasal mucosa []   []         Lids/Periorbital [x]   []       Ears []   []         EOM [x]   []    Neck         Pupils [x]   []       Inspect/palp []   []      Psychiatric       Thyroid []   []   Oriented (p,p,t) [x]   []       Neck ROM/spple []   []         Affect [x]   []    Lymphatic         Judgemnt/insite [x]   []       Neck []   []      Neurological       Supraclavicular []   []         CN I-XII [x]   []    Respiratory         Sensation [x]   []       Auscultation []   []      Skin         Chest expansn []   []         Inspect [x]   []    Cardiovascular         Palpate [x]   []       Auscultation []   []         Periungal cap bed []   []       Depnt edema []   []      Musculoskeletal    GI          Motor []   []       Abdomen []   []        Gait []   []       Liver/Spleen []   []        Nailbeds []   [x]    Other findings:      []  28 Joints, or  []  64 joints were examined.    Either exam includes all 4 extremites. Except where noted, the joints examined have FROM, no tenderness or swelling; normal stability, and normal strength.    Abnormalities are coded on homonculus or detailed below:    - Bony hypertrophy small joints of the fingers, particularly middle, ring, and pinky finger PIPs.   - Enlarged R hand DIPs  - hypersensitivity to light touch   - mild swelling and tenderness to palpation R 3rd DIP   - mucoid cyst L 3rd DIP       ~~~~~~~~~~~~~~~~~~~~~~~~~~~~~~~~~~~~~~~~~~~~~~~~~~~~~~~~~~~~~~~~    RIGHT         LEFT   TMJ [] []   ~ [] []  TMJ   Shoulder [] []  ~ [] []  Shoulder                Left box: Tender   Elbow [] []  ~ [] []  Elbow               Right box: Swollen   Wrist [] []  ~ [] []  Wrist   CMC1 [] []  ~ [] []  CMC1       MCP5 MCP4 MCP3 MCP2 MCP1         ~ MCP1 MCP2 MCP3 MCP4 MCP5       [] []  [] []  [] []  [] []  [] []   [] []  [] []  [] []  [] []  [] []        PIP5 PIP4 PIP3 PIP2 IP1 ~ IP1 PIP2 PIP3 PIP4 PIP5       [] []  [] []  [] []  [] []  [] []   [] []  [] []  [] []  [] []  [] []        DIP5 DIP4 DIP3 DIP2  ~  DIP2 DIP3 DIP4 DIP5       [] []  [] []  [x] [x]  [] []     [] []  [] []  [] []  [] []       Sacroiliac []  ~             []  Sacroiliac                Hip  [] []  ~        [] []  Hip  Knee  [] []  ~        [] []  Knee        Ankle    [] []  ~        [] []  Ankle           MTP5 MTP4 MTP3 MTP2 MTP1 ~ MTP1 MTP2 MTP3 MTP4 MCP5       [] []  [] []  [] []  [] []  [] []   [] []  [] []  [] []  [] []  [] []        Toe5 Toe4 Toe3 Toe2 Toe1 ~ Toe1 Toe2 Toe3 Toe4 Toe5        [] []  [] []  [] []  [] []  [] []   [] []  [] []  [] []  [] []  [] []                      ~~~~~~~~~~~~~~~~~~~~~~~~~~~~~~~~~~~~~~~~~~~~~~~~~~~~~~~~~~~~~~~~  Fibromyalgia tender point count: __/18      Recent Labs and Test Results:     Lab Results   Component Value Date    CRP <0.3 09/23/2016    CRP <0.5 05/19/2007    SRWEST 6 09/23/2016    SRWEST 11 05/19/2007     Lab Results   Component Value Date    WBC 3.76 (L) 09/23/2016    HGB 13.1 09/23/2016    HCT 37.3 09/23/2016    PLT 202 09/23/2016    NEUTABS 2.08 09/23/2016    LYMPHABS 1.12 (L) 09/23/2016     Lab Results Component Value Date    GLUCOSE 102 (H) 05/08/2017    CREAT 0.74 12/29/2017    CALCIUM 9.1 05/08/2017    TOTPRO 6.6 05/08/2017    ALBUMIN 4.4 05/08/2017    AST 19 05/08/2017    ALT 15 05/08/2017    ALKPHOS 48 05/08/2017     No results found for: C3, C4, DSDNAAB, NDNAABIFA  Lab Results   Component Value Date    PROTCLUR Negative 02/14/2016    BLDUR 1+ (A) 02/14/2016    LEUKESTUR Negative 02/14/2016    RBCSUR 11 02/14/2016    WBCSUR 2 02/14/2016     Lab Results   Component Value Date    VITD25OH 35 05/08/2017    VITD25OH 42 02/20/2017     Lab Results   Component Value Date    CKTOT 64 06/02/2016       No visits with results within 1 Month(s) from this visit.   Latest known visit with results is:   Clinical Support on 12/29/2017   Component Date Value   ??? GFR Estimate for Non-Afr* 12/29/2017 85    ??? GFR Estimate for African* 12/29/2017 >89    ??? GFR Additional Informati* 12/29/2017 See Comment    ??? Creatinine 12/29/2017 0.74          Attestation:  Scribe Signature:  I, Nicolette Morris, have scribed for Dr. Linus Mako with the documentation for Ifeoma Vallin on 04/08/2018 at 10:54 AM.    Physician Signature:  Dr. Linus Mako 04/08/2018 10:54 AM     I have reviewed this note, scribed by Mariane Baumgarten and attest that it is an accurate representation of my H & P and other events of the outpatient visit except if otherwise noted.

## 2018-04-13 NOTE — Progress Notes
Hello Good Afternoon,    Patient has been scheduled to see Dr. Baird Kay in March

## 2018-04-14 ENCOUNTER — Ambulatory Visit: Payer: BC Managed Care – POS

## 2018-04-15 MED ORDER — ACYCLOVIR 400 MG PO TABS
400 mg | ORAL_TABLET | Freq: Two times a day (BID) | ORAL | 1 refills | Status: AC
Start: 2018-04-15 — End: 2018-10-15

## 2018-04-27 ENCOUNTER — Ambulatory Visit: Payer: MEDICARE

## 2018-04-28 ENCOUNTER — Ambulatory Visit: Payer: BLUE CROSS/BLUE SHIELD

## 2018-04-29 DIAGNOSIS — R21 Rash and other nonspecific skin eruption: Secondary | ICD-10-CM

## 2018-05-09 DIAGNOSIS — H9191 Unspecified hearing loss, right ear: Secondary | ICD-10-CM

## 2018-05-09 DIAGNOSIS — M810 Age-related osteoporosis without current pathological fracture: Secondary | ICD-10-CM

## 2018-05-09 DIAGNOSIS — Z23 Encounter for immunization: Secondary | ICD-10-CM

## 2018-05-10 ENCOUNTER — Ambulatory Visit: Payer: BLUE CROSS/BLUE SHIELD

## 2018-05-10 DIAGNOSIS — M797 Fibromyalgia: Secondary | ICD-10-CM

## 2018-05-10 NOTE — Patient Instructions
Please send a copy of your Durable Power of Attorney/[employer] Planning Form to our office so we can put it in our computer system.  This will make it available to all your physicians, the Emergency Department, and our hospitals, if needed.     Have the ''Prevnar 13'' shot to prevent pneumonia

## 2018-05-10 NOTE — H&P
PATIENT:  Robin Spencer  MRN:  [id number]  DOB:  August 28, 1950  DATE OF SERVICE:  05/10/2018    PRIMARY CARE PROVIDER: Eean Buss, Weyman Croon., MD    Cc: Welcome to [employer] Initial Preventive Physical Examination OR Follow up Annual Wellness Visit Follow up and Follow up of Chronic Conditions  Subjective:     Robin Spencer is a a 68 y.o. female who presents for Welcome to Medicare Initial Preventive Physical Examination or Annual Wellness Visit Follow up  and discussion and follow up of chronic conditions as listed below.    Note: This is a one time examination performed within 12 months of the first effective date of Medicare Part B coverage.  [id number]; or as a follow up 12 months after the last Annual Wellness Exam [phone]    1.  FMS: Better sleep with flexeril.     2.  Osteoporosis/Increased PTH:  To see Dr. Selena Batten and will discuss.  Seeing Dr. Ladona Ridgel. To have DEXA.  On TAM now.     3.  Varicose veins: Dr. Keene Breath will rx.     4.  History of Invasive Ductal Carcinoma: On TAM since 2018.  To see clinical nutrition about weight.     5.  Palpitations:  Very rare skipped beat/flutter.     6.  Itchy scalp/mole on back:   Saw Dr. Otelia Limes in derm a few months ago, has a few flakes, will ask hair stylist about shampoo, will contact the office if wants to see dermatology.     7.  Hand cyst: To see Dr. Augusto Garbe per Dr. Ladona Ridgel.     6. Atrophic vaginitis:  Feels well on Yuvafem.        Other MDs seen in continuity:  Dr. Ladona Ridgel (rheumatology); Dr. Selena Batten (endo), Dr. Royetta Asal (med onc/surg onc/rad onc); Dr. Hortencia Pilar (ophthalmology)    Patient Active Problem List    Diagnosis Date Noted   ??? Fibromyalgia 05/10/2018     Better sleep with flexeril       ??? Rash, Intermittent, noted after colds 04/29/2018   ??? Asymptomatic varicose veins 03/01/2018   ??? Vitreous floater, bilateral 06/11/2017     No retinal detachment, retinal tear, or vitreous hemorrhage on dilated fundus exam. Signs and symptoms reviewed. ??? Increased PTH level 05/08/2017     Sees Dr. Selena Batten in endocrinology.      ??? Syncope and collapse 06/25/2016     2018, ECG/Labs/MRI w/o gad unrevealing, saw neurology Dr. Lorina Rabon, plan; EEG, followup      ??? Immune to measles 05/09/2016     (+) Ab 2018     ??? Invasive ductal carcinoma of left breast, ER+PR+HER2- 01/29/2016     2017, Bilateral tomosynthesis screening mammogram on 12/11/2015. There were no suspicious findings seen. She had postsurgical changes in both breasts. Patient then developed bilateral breast pain, left greater than right. She was sent for bilateral diagnostic ultrasounds on 01/15/2016. The right breast was negative. However, in the left breast, there was an irregular mass seen with indistinct margins, measuring 5 x 6 x 4 mm, at 12 o'clock, 6 cm from the nipple. Her left axilla was negative. Genetic testing: NO MUTATION DETECTED/This panel tests three Founder Mutations in the BRCA1/2 genes (BRCA1 187delAG, BRCA1 5385insC, BRCA2 6174delT; lumpectomy: sentinal node negative x 2.  TAM recommended by oncology, annual endometrial surveillance given history by GYN.      ??? Breast pain 01/09/2016     [facility] plan per NP, imaging,  lifestyle changes     ??? Cataract, nuclear sclerotic, both eyes 11/29/2015     Not yet visually significant. Continue observation.      ??? Status post LASIK surgery of both eyes 11/29/2015     Performed by Dr. Ellin Goodie (2000 ?). Originally set with left eye for distance and low myopia for right.     ??? Fibroid 04/09/2015     2017 on ultrasound.      ??? BPPV (benign paroxysmal positional vertigo) 07/19/2013   ??? Menopause 04/14/2013   ??? Seborrheic dermatitis 05/11/2012   ??? 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 PPD positive 04/07/2012   ??? Palpitations 04/07/2012     2009 ER PACs, PVCs, sinus arrythmia, ECHO, Stress ECHO WNL 2009     ??? OA (osteoarthritis) 04/07/2012     Hands 2011, anti CCP neg, no evidence inflammation ??? Visit for preventive health examination 04/07/2012     Colo noscopy= hyperplastic polyps 2017     ??? Hearing loss, right 04/07/2012     Otosclerosis, s/p stapedectomy     ??? Eye allergies 01/08/2012     Not having significant symptoms at this time, but could use over the counter anti-histamine eye drops, like ketotifen or artificial tears as needed if symptoms recur.     ??? Osteoporosis 11/10/2011     DEXA 2013, spine-2.4, femoral neck -2.9. On HT for menopause sx and as osteoporosis rx.; PA lumbar spine T-score of -2.7, will begin TAM per oncology.            Allergies   Allergen Reactions   ??? Penicillins Other (See Comments) and Rash     Ampicillin, flesh rash in 1980s   ??? Menadiol Sodium Diphosphate Rash   ??? Other      Red [place] coloring on meds such as advil- not allergic to actual med- just the coloring   ??? Acetaminophen Rash       Medications that the patient states to be currently taking   Medication Sig   ??? acyclovir 400 mg tablet Take 1 tablet (400 mg total) by mouth two (2) times daily.   ??? BIOTIN PO Take 1 tablet by mouth daily .     ??? CALCIUM CITRATE PO Take 630 mg by mouth daily.   ??? Cholecalciferol (VITAMIN D3) 5000 units CAPS Take 1 capsule by mouth daily.   ??? cyanocobalamin 500 mcg tablet Take 500 mcg by mouth daily.   ??? cyclobenzaprine 5 mg tablet Take 1 tablet (5 mg total) by mouth at bedtime.   ??? estradiol (VAGIFEM) 10 mcg vaginal tablet Place 1 tablet (10 mcg total) vaginally two (2) times a week.   ??? MAGNESIUM CITRATE PO Take 150 mg by mouth two (2) times daily.   ??? Omega-3 Fatty Acids (FISH OIL PO) Take 1,400 mg by mouth two (2) times daily.   ??? tamoxifen 20 mg tablet Take 1 tablet (20 mg total) by mouth daily.   ??? triamcinolone 0.5% cream Apply topically as needed for.   ??? UNABLE TO FIND Med Name: [place] supplement- Epimedium, Tumeric, Japanese, Fleeceflower, EPA/DHA .         Past Medical History:   Diagnosis Date   ??? Anxiety    ??? Breast cancer (HCC/RAF)    ??? Eczema    ??? Fibroids ??? GERD (gastroesophageal reflux disease)     esophagitis   ??? History of migraine headaches    ??? History of radiation therapy 04/2016    L breast   ???  Otosclerosis    ??? Post-operative nausea and vomiting    ??? Rectal fissure    ??? Vitreous detachment        Past Surgical History:   Procedure Laterality Date   ??? BREAST BIOPSY  1986    Fibroadenoma, ductal hyperplasia   ??? Laparoscopy for subfertility     ??? Stapedectomy, right         Family History   Problem Relation Age of Onset   ??? Hypertension Father    ??? Stroke Father    ??? Hyperlipidemia Father    ??? Depression Father    ??? Prostate cancer Father 61   ??? Hypertension Mother    ??? Asthma Mother    ??? Endometrial cancer Mother 72   ??? Hyperthyroidism Mother    ??? Prostate cancer Brother 53   ??? Breast cancer Neg Hx    ??? Ovarian cancer Neg Hx    ??? Malignant hypertension Neg Hx        Robin Spencer  reports that she has never smoked. She has never used smokeless tobacco. She reports that she does not drink alcohol or use drugs.  Lives with husband.      Opioid use: None.     Diet:  Tries to eat a lot of vegetables, fruits.  Planning to work with clinical nutrition.  Feels needs to increase protein.     Physical Activities, including limitations:  Does Pilates and hiking for exercise.  Also ballroom dancing.     Self assessment of health: Good.     Social support: Husband and good friends.     Activities of Daily Living (note any not independent):   bathing: personal hygiene and grooming; dressing: dressing and undressing; transferring: movement and mobility; toileting: continence-related tasks including control and hygiene; eating: preparing food and feeding    Instrumental Activities of Daily Living (note any not independent): cooking, cleaning, finances, shopping, healthcare and medication, using telephones and technology, transportation, and caring for other individuals and pets.    Fall risk:      (1) Falls in the past year? Yes x 1 (2) Injury from fall in the past year? No injury.     Declines PT for now.     Home Safety:      (1) Are you worried about clutter in your home? No  (2) Do you have poor lighting by day or night in your home? No  (3) Are there slippery surfaces in your home? No  (4) Are you worried about falling when using your toilet, bath or shower facilities? No      Goals of Care/DPA:  Has not completed yet. Considering.       Immunization History   Administered Date(s) Administered   ??? Hepatitis A, Adult 12/10/2006, 04/24/2016   ??? Hepatitis A, unspecified formulation 12/10/2006   ??? Tdap 04/26/2010, 01/24/2017   ??? Typhoid Inactivated, ViCPs 04/24/2016   ??? influenza vaccine IM quadrivalent (Fluzone Quad) (PF) SYR/SDV (56 months of age and older) 02/14/2014   ??? influenza vaccine IM trivalent adjuvanted (FluAD) (PF) SYR (38 years of age and older) 01/12/2018   ??? influenza vaccine IM trivalent high dose (Fluzone High Dose) (PF) SYR (55 years of age and older) 01/16/2017   ??? influenza, unspecified formulation 02/01/1999, 01/22/2000, 01/28/2002, 02/14/2014, 02/01/2016, 01/12/2018   ??? zoster live vaccine (Zostavax) 04/15/2012   ??? zoster vac recomb adjuvanted (Shingrix) 06/26/2016, 06/02/2017         Health Maintenance  Topic Date Due   ??? Pneumococcal Vaccine (1 of 2 - PCV13) 08/11/2016   ??? Annual Preventive Wellness Visit  05/08/2018   ??? Breast Ca Screening: MAMMOGRAM  11/25/2018   ??? Cervical Ca Screening: HPV Testing  04/08/2020   ??? Cervical Ca Screening: PAP Smear  04/08/2020   ??? Colon Ca Screening: COLONOSCOPY  10/07/2025   ??? Tdap/Td Vaccine (3 - Td) 01/25/2027   ??? Influenza Vaccine  Completed   ??? Shingles (Zostavax) Vaccine  Completed   ??? Osteoporosis Early Detection DEXA Scan  Completed   ??? Hepatitis C Screening  Completed   ??? Shingles (Shingrix) Vaccine  Completed         Review of Systems:     Depression screen:    (1) During the past month, have you been bothered by feeling down, depressed or helpless? No (2) During the past month, have you often been bothered by little interest or pleasure in doing things? No    Hearing: Hearing aids are good, goes to have checked frequently.     Vision:  Vision is good per patient.     Const:  No significant weight gain or loss  Cor:  No exertional chest pain.  Pulm:  No exertional dyspnea.  Endo:  Postmenopausal GU: Sex with one female partner.  Psych: 2 question depression scale negative.     Objective:  PE:  Const:  Temp 37.2 ???C (99 ???F) (Tympanic)  ~ Ht 5' 4'' (1.626 m)  ~ Wt 136 lb (61.7 kg)  ~ LMP  (LMP Unknown)  ~ BMI 23.34 kg/m???    NAD.   ENT:  OP clear.  Teeth intact.  Neck:  Normal appearance.  No thyromegaly or masses.  Pulm:  Normal respiratory effort.  Normal auscultation.  Cor:  RRRS1S2 without m,r,g.  No peripheral edema.  Breasts: Normal in appearance.  No masses.  Lymph:  No cervical or axillary adenopathy.  Abdomen:  Soft, nontender. No HSM.  Skin:  Warm, no rashes.  Multiple SKs on back, no deeply pigmented lesions.   Psych:  Alert and oriented to person, place, time, and situation.  Affect appropriate.  Judgement appears intact.     A trained chaperone was present for this exam/procedure: No  Reason why a trained chaperone was not present: Patient declined            LABS:  Lab Results   Component Value Date    WBC 3.76 (L) 09/23/2016    HGB 13.1 09/23/2016    HCT 37.3 09/23/2016    MCV 99.7 (H) 09/23/2016    PLT 202 09/23/2016     Lab Results   Component Value Date    CREAT 0.74 12/29/2017    BUN 22 05/08/2017    NA 144 05/08/2017    K 4.3 05/08/2017    CL 104 05/08/2017    CO2 29 05/08/2017     Lab Results   Component Value Date    ALT 15 05/08/2017    AST 19 05/08/2017    ALKPHOS 48 05/08/2017    BILITOT 0.2 05/08/2017     Lab Results   Component Value Date    CHOL 213 05/08/2017    CHOLDLCAL 144 (H) 04/20/2014    TRIGLY 99 04/20/2014     Lab Results   Component Value Date    TSH 0.89 05/08/2017             Assessment & Plan:     Assessment: 1.  FMS: Better sleep with flexeril.     2.  Osteoporosis/Increased PTH:  To see Dr. Selena Batten and will discuss.  Seeing Dr. Ladona Ridgel. To have DEXA.  On TAM now.     3.  Varicose veins: Dr. Keene Breath will rx.     4.  History of Invasive Ductal Carcinoma: On TAM since 2018.  To see clinical nutrition about weight.     5.  Palpitations:  Very rare skipped beat/flutter.     6.  Itchy scalp/mole on back:   Saw Dr. Otelia Limes in derm a few months ago, has a few flakes, will ask hair stylist about shampoo, will contact the office if wants to see dermatology.     6. Atrophic vaginitis:  Feels well on Yuvafem.      Annual Preventive Exam  Patient Active Problem List   Diagnosis   ??? Osteoporosis   ??? Eye allergies   ??? Genital HSV   ??? PPD positive   ??? Palpitations   ??? OA (osteoarthritis)   ??? Visit for preventive health examination   ??? Hearing loss, right   ??? Seborrheic dermatitis   ??? Menopause   ??? BPPV (benign paroxysmal positional vertigo)   ??? Fibroid   ??? Cataract, nuclear sclerotic, both eyes   ??? Status post LASIK surgery of both eyes   ??? Breast pain   ??? Invasive ductal carcinoma of left breast, ER+PR+HER2-   ??? Immune to measles   ??? Syncope and collapse   ??? Increased PTH level   ??? Vitreous floater, bilateral   ??? Asymptomatic varicose veins   ??? Rash, Intermittent, noted after colds   ??? Fibromyalgia     Health Maintenance   Topic Date Due   ??? Pneumococcal Vaccine (1 of 2 - PCV13) 08/11/2016   ??? Annual Preventive Wellness Visit  05/08/2018   ??? Breast Ca Screening: MAMMOGRAM  11/25/2018   ??? Cervical Ca Screening: HPV Testing  04/08/2020   ??? Cervical Ca Screening: PAP Smear  04/08/2020   ??? Colon Ca Screening: COLONOSCOPY  10/07/2025   ??? Tdap/Td Vaccine (3 - Td) 01/25/2027   ??? Influenza Vaccine  Completed   ??? Shingles (Zostavax) Vaccine  Completed   ??? Osteoporosis Early Detection DEXA Scan  Completed   ??? Hepatitis C Screening  Completed   ??? Shingles (Shingrix) Vaccine  Completed Personalized Prevention Plan Services and  Management of Medical Conditions:     Health Maintenance   Topic Date Due   ??? Pneumococcal Vaccine (1 of 2 - PCV13) 08/11/2016   ??? Annual Preventive Wellness Visit  05/08/2018   ??? Breast Ca Screening: MAMMOGRAM  11/25/2018   ??? Cervical Ca Screening: HPV Testing  04/08/2020   ??? Cervical Ca Screening: PAP Smear  04/08/2020   ??? Colon Ca Screening: COLONOSCOPY  10/07/2025   ??? Tdap/Td Vaccine (3 - Td) 01/25/2027   ??? Influenza Vaccine  Completed   ??? Shingles (Zostavax) Vaccine  Completed   ??? Osteoporosis Early Detection DEXA Scan  Completed   ??? Hepatitis C Screening  Completed   ??? Shingles (Shingrix) Vaccine  Completed     Orders Placed This Encounter   ??? Pneumococcal Conjugate Vaccine 13-Valent IM (Prevnar 13) - ACIP recommended >=19 immunocompromised, functional or anatomic asplenia, CSF leaks, cochlear implants, and >=65 one time. For patients 65 and order, the interval between administration of PCV13/Prevnar and PPSV23/Pneumovax be one year regardless of which vaccine was given first.   ??? Comprehensive Metabolic Panel   ??? Vitamin  D,25-Hydroxy   ??? TSH   ??? Chol,LDL,Quant   ??? Cholesterol   ??? Cholesterol, HDL   ??? PTH,Intact & Calcium   ??? Calcium,Ionized     Personalized Health Advice (if applicable, includes referrals to health education or preventive counseling services or programs):      Patient Instructions   Please send a copy of your Durable Power of Attorney/Advanced Heathcare Planning Form to our office so we can put it in our computer system.  This will make it available to all your physicians, the Emergency Department, and our hospitals, if needed.     Have the ''Prevnar 13'' shot to prevent pneumonia.    Tamoxifen is preventive of fractures. Discuss this with your endocrinologist, Dr. Selena Batten, when considering treatments.     Return in about 1 year (around 05/11/2019) for Annual Wellness- 30 minutes.        The above plan of care, diagnosis, order, and follow-up were discussed with the patient. Questions related to this recommended plan of care were answered.    Verbal or written information was provided to the patient about end of life planning/advanced care planning.     The services and diagnoses listed above in addition to the Medicare Annual Visit constituted separate and distinct services and diagnoses that were addressed and discussed with the patient today.  Author:  Weyman Croon. Vinay Ertl 05/10/2018 4:19 PM

## 2018-05-13 ENCOUNTER — Ambulatory Visit: Payer: BLUE CROSS/BLUE SHIELD | Attending: Rheumatology

## 2018-05-13 ENCOUNTER — Ambulatory Visit: Payer: BLUE CROSS/BLUE SHIELD

## 2018-05-13 DIAGNOSIS — M810 Age-related osteoporosis without current pathological fracture: Secondary | ICD-10-CM

## 2018-05-14 ENCOUNTER — Ambulatory Visit: Payer: MEDICARE

## 2018-05-14 DIAGNOSIS — M6284 Sarcopenia: Secondary | ICD-10-CM

## 2018-05-14 NOTE — Consults
CLINICAL NUTRITION H&P    PATIENT: Robin Spencer  MRN: [id number]  DOB: 02/29/1951  DATE OF SERVICE: 05/14/2018    REFERRING PRACTITIONER: No ref. provider found  PRIMARY CARE PROVIDER: Pregler, Weyman Croon., MD    REASON FOR REFERRAL: Abnormal weight gain      CHIEF COMPLAINT:  Chief Complaint   Patient presents with   ??? Weight Loss       The following history was generated by an interview with the patient as well as a review of medical records obtained in [facility] and [facility] Everywhere.    Subjective:     History of Present Illness  Robin Spencer is a 68 y.o. female with a h/o Invasive Ductal Carcinoma - on tamoxifen . The patient presents for weight loss.     When did you first start struggling with your weight? Since past 2 years after starting tamoxifen     Post-menopausal.     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  Ideal body weight: 54.7 kg (120 lb 9.5 oz)  Adjusted ideal body weight: 57.6 kg (126 lb 14 oz)  Wt Readings from Last 10 Encounters:   05/14/18 61.8 kg (136 lb 4.8 oz)   05/10/18 61.7 kg (136 lb)   04/08/18 61.6 kg (135 lb 11.2 oz)   02/26/18 59 kg (130 lb)   08/03/17 58.8 kg (129 lb 9.6 oz)   06/15/17 58.2 kg (128 lb 3.2 oz)   05/08/17 57.6 kg (127 lb)   02/25/17 56.2 kg (123 lb 14.4 oz)   01/21/17 56.2 kg (124 lb)   12/23/16 56 kg (123 lb 6.4 oz)       BEVERAGE/FLUIDS 24-HOUR RECALL:    24 Hour Dietary Recalls:    Breakfast:  Smoothie w/ kale spinach yogurt (3/4) cup, almond milk, banana, pineapple, blueberries, strawberries, mango    Snack: Lunch: Eggs/omelettoast or tortilla, tamale, occasional pasta (whole wheat or w/ butter and [place] rice/quinoa)    Snack:       grapes, nuts, pear w/ cheese    Dinner:   salad w/ chicken in it; chicken or [place] patty soup w/ meatballs boiled in fish salad, quinoa    Snack:  popcorn grapes, nuts/trailmix occasional granola w/ fresh berries    Beverage:       U    ESTIMATED CALORIE INTAKE:  kcal      Social History:   Food shopping: self   Cooking: self   Living situation:   Social History     Socioeconomic History   ??? Marital status: Married     Spouse name: Not on file   ??? Number of children: Not on file   ??? Years of education: Not on file   ??? Highest education level: Not on file   Occupational History   ??? Not on file   Social Needs   ??? Financial resource strain: Not on file   ??? Food insecurity:     Worry: Not on file     Inability: Not on file   ???  Transportation needs:     Medical: Not on file     Non-medical: Not on file   Tobacco Use   ??? Smoking status: Never Smoker   ??? Smokeless tobacco: Never Used   Substance and Sexual Activity   ??? Alcohol use: No     Alcohol/week: 0.0 oz   ??? Drug use: No   ??? Sexual activity: Yes     Partners: Male     Birth control/protection: None   Lifestyle   ??? Physical activity:     Days per week: Not on file     Minutes per session: Not on file   ??? Stress: Not on file   Relationships   ??? Social connections:     Talks on phone: Not on file     Gets together: Not on file     Attends religious service: Not on file     Active member of club or organization: Not on file     Attends meetings of clubs or organizations: Not on file     Relationship status: Not on file   Other Topics Concern   ??? Not on file   Social History Narrative    Patient is married, and has two children. She is of Ashkenazi Agricultural engineer.        Work: Retired from [employer] relations        Exercise:        Diet:        Transfusion:        Religion:       Past Medical History:     Past Medical History: Diagnosis Date   ??? Anxiety    ??? Breast cancer (HCC/RAF)    ??? Eczema    ??? Fibroids    ??? GERD (gastroesophageal reflux disease)     esophagitis   ??? History of migraine headaches    ??? History of radiation therapy 04/2016    L breast   ??? Otosclerosis    ??? Post-operative nausea and vomiting    ??? Rectal fissure    ??? Vitreous detachment         Past Surgical History:     Past Surgical History:   Procedure Laterality Date   ??? BREAST BIOPSY  1986    Fibroadenoma, ductal hyperplasia   ??? Laparoscopy for subfertility     ??? Stapedectomy, right          Medications:     Medications that the patient states to be currently taking   Medication Sig   ??? acyclovir 400 mg tablet Take 1 tablet (400 mg total) by mouth two (2) times daily.   ??? BIOTIN PO Take 1 tablet by mouth daily .     ??? CALCIUM CITRATE PO Take 630 mg by mouth daily.   ??? Cholecalciferol (VITAMIN D3) 5000 units CAPS Take 1 capsule by mouth daily.   ??? cyanocobalamin 500 mcg tablet Take 500 mcg by mouth daily.   ??? cyclobenzaprine 5 mg tablet Take 1 tablet (5 mg total) by mouth at bedtime.   ??? estradiol (VAGIFEM) 10 mcg vaginal tablet Place 1 tablet (10 mcg total) vaginally two (2) times a week.   ??? MAGNESIUM CITRATE PO Take 150 mg by mouth two (2) times daily.   ??? Omega-3 Fatty Acids (FISH OIL PO) Take 1,400 mg by mouth two (2) times daily.   ??? tamoxifen 20 mg tablet Take 1 tablet (20 mg total) by mouth daily.   ???  triamcinolone 0.5% cream Apply topically as needed for.   ??? UNABLE TO FIND Med Name: Promedley supplement- Epimedium, Tumeric, Japanese, Fleeceflower, EPA/DHA .        Allergies:     Allergies   Allergen Reactions   ??? Penicillins Other (See Comments) and Rash     Ampicillin, flesh rash in 1980s   ??? Menadiol Sodium Diphosphate Rash   ??? Other      Red [place] coloring on meds such as advil- not allergic to actual med- just the coloring   ??? Acetaminophen Rash        Family History:     Family History   Problem Relation Age of Onset   ??? Hypertension Father ??? Stroke Father    ??? Hyperlipidemia Father    ??? Depression Father    ??? Prostate cancer Father 66   ??? Hypertension Mother    ??? Asthma Mother    ??? Endometrial cancer Mother 67   ??? Hyperthyroidism Mother    ??? Prostate cancer Brother 22   ??? Breast cancer Neg Hx    ??? Ovarian cancer Neg Hx    ??? Malignant hypertension Neg Hx           ROS:     All systems below reviewed and negative except as marked.     System Check if Present Description   Constit []  Fever []  Chills []  Nightsweats     HEENT []  Vision changes []  Increase in hat size       []  Widening gap between teeth     Resp []  SOB []  Wheezing      CV []  Chest pain  []  Palpitations     GI []  Nausea []  Vomiting  []  Constipation  []  Diarrhea  [] Abdominal pain  [] Bloating     MSK []  Muscle/Joint pain []  Proximal Muscle   []  Weakness []   Increase in shoe size    []   Increase in ring size []  Decreased muscle mass    Endo []  Heat/cold intolerance    []  Polyuria/polydipsia  []  Abnormal hair growth  []  Acne  []  Sweating    Skin []  Rashes []  Hair Loss []  Brittle Nails []  oily skin     Heme []  Easy bleeding/bruising    Neuro []  Headaches  []  Hx of seizure  []  tremor     Psych []  Depression           Objective:      Vitals: BP 107/72  ~ Pulse 82  ~ Temp 36.6 ???C (97.8 ???F) (Oral)  ~ Resp 12  ~ Ht 1.626 m (5' 4'')  ~ Wt 61.8 kg (136 lb 4.8 oz)  ~ LMP  (LMP Unknown)  ~ BMI 23.40 kg/m???    General: NAD, appears stated age  Head: NC, AT, no frontal bossing, no prominent jaw  Eyes: EOMI, sclerae anicteric  OMT: MMM, no widening gap in between teeth  Neck: no thyromegaly, no cervical fat pad  GI: central adiposity present   Neuro: no tremor,  no difficulty going from sitting to standing or standing to sitting (proximal muscle weakness)  Psych: A&O x3, normal mood and affect   Skin: no increased hair growth, no violaceous striae, no acne, no ecchymoses  MSK: no hand swelling, no feet swelling        Labs:        CBC    Lab Results   Component Value Date    WBC 3.76 (L) 09/23/2016 HGB  13.1 09/23/2016    HCT 37.3 09/23/2016    MCV 99.7 (H) 09/23/2016    PLT 202 09/23/2016    BMP    Lab Results   Component Value Date    NA 143 05/10/2018    K 4.5 05/10/2018    CL 102 05/10/2018    CO2 32 (H) 05/10/2018    BUN 19 05/10/2018    CREAT 0.72 05/10/2018    GLUCOSE 98 05/10/2018     Ca/ Mg/ Phos/Zinc/Copper    Lab Results   Component Value Date    CALCIUM 9.2 05/10/2018    CALCIUM 9.2 05/10/2018    ICALCOR 1.11 05/10/2018    ALBUMIN 4.6 05/10/2018      Lipids    Lab Results   Component Value Date    CHOL 210 05/10/2018    TRIGLY 99 04/20/2014    CHOLHDL 71 05/10/2018    NOHDLCHOCAL 164 (H) 04/20/2014    CHOLDLCAL 144 (H) 04/20/2014    HbA1c     No results found for: HGBA1C No results for input(s): GLUCOSE in the last 72 hours. No results for input(s): GLUCOSEPOC in the last 72 hours. No results for input(s): INSULIN, CPEPTIDE in the last 72 hours.  PTH / Vitamin D    Lab Results   Component Value Date    VITD25OH 43 05/10/2018    PTHINT 64 (H) 05/10/2018        LFT    Lab Results   Component Value Date    ALT 15 05/10/2018    AST 19 05/10/2018    ALKPHOS 56 05/10/2018    BILITOT 0.2 05/10/2018     Creatinine    Lab Results   Component Value Date    CREAT 0.72 05/10/2018     Lab Results   Component Value Date    GFR ... 04/20/2014    GFRESTNOAA 88 05/10/2018    GFRESTAA >89 05/10/2018        Endocrine    Lab Results   Component Value Date    TSH 2.0 05/10/2018    T4AUTO 1.3 01/22/2017      B Vitamins    Lab Results   Component Value Date    VITAMINB12 1,123 (H) 09/23/2016    FOLATE 20.6 06/26/2016            Iron Panel    No results found for: RETICCNT, FE, TIBC, FEBINDSAT, FERRITIN, HAPTOGLOB, LDH, EPO Fat Soluble Vitamins    Lab Results   Component Value Date    INR 1.0 12/28/2015          Assessment:          1. Sarcopenia   Body composition done: %body fat 39; BMR 1150 cal   2. Invasive ductal carcinoma of left breast, ER+PR+HER2-   On tamoxefen - likely contributing to weight gain 3. Menopause   Stable - increase in muscle loss as evidenced in body composition.         Body mass index is 23.4 kg/m???.    Starting weight (05/14/2018):   Vitals:    05/14/18 0930   Weight: 61.8 kg (136 lb 4.8 oz)   Height: 1.626 m (5' 4'')       Plan/ Recommendation:     Calorie goal: 1200  Protein goal: 80 grams    Stop nuts, popcorn and granola and increase protein intake.     Activity: Continue pilates, yoga and hiking     Medications: Does not qualify for  weight loss medications at this time.    Starches: whole grains, beans/legumes or starchy vegetables  - Whole grains: [place] rice, quinoa, oats (steel cut or rolled, not instant), bulgar, kamut  - Starchy vegetables: peas, corn, potatoes, squash  - Beans/legumes (including soybeans)    Fruit  - One serving is 1 medium-sized fruit, 1 cup of fruit, or 1/2 large banana  - Avoid dried fruit and fruit juices    Protein  - One serving is ~3-4 oz.  - Lean protein sources: fish, chicken, [place], whey protein, non-fat dairy (greek yogurt), eggs (1 yolk ) + egg whites    Vegetables (non-starchy)  -One serving of vegetables is:  --- 1 cup raw vegetables  --- 1/2 cup cooked vegetables  --- 2 cups leafy vegetables  - Non-starchy vegetables: broccoli, carrots, green beans, spinach, etc.    Healthy sources of fat (to be consumed in moderation as they are high in calories)  - Nuts/Seeds  - Avocado  - Grape seed or avocado oil for cooking (medium-high heat)  - Virgin olive oil for cooking with low heat  - Canola oil or extra virgin olive oil for cold/warm dishes (not for cooking)    Beverage:  Water or unsweetened plain tea    Physical activity:   -stay active as much as possible    Healthy lifestyle changes:   -home made foods as much as possible  -keep food record   -eat slowly in a calm environment, not in front of the computer or TV    The above plan of care, diagnosis, orders, and follow-up were discussed with the patient.  Questions related to this recommended plan of care were answered.      Author:   Conni Elliot, MD  Assistant Professor, Department of Medicine - Clinical Nutrition  [phone]

## 2018-05-14 NOTE — Patient Instructions
Total protein 80 grams   Calorie goals 1200    Stop nuts, popcorn and granola     Brands of protein shakes we recommend:     Whey Protein  Pre-made:  Orgain Clean Protein- whey protein ([employer], [employer])  Premiere Protein shake ([employer], Social research officer, government)  Pure Protein (Amazon, [employer])    Tera's whey - unflavored or vanilla/stevia     Powder:  Organic Fuel Whey Protein (Amazon)  OptimumGold Whey Protein (from [employer], [employer])  Sharyne Peach Whey or Egg white protein powder (Amazon, ?[employer])     Note: ingredients and nutrition facts may change, so please review the label before purchasing      General tips  1) Please read ingredients carefully if you have allergies/intolerance to gluten, soy, lactose or artificial sweetners.  2) You can find these at most pharmacies and grocery stores.   3) Prepare by adding to water per the directions on the container.    Note:  You may want to go to healthfood stores like [employer] or [employer] to see if you can purchase individual packages (single serving) of the protein powder to try it out before you commit to a large container and so you can try out different brands/flavors     If buying protein powder, suggestions for preparation  1) What to mix it with  ??? Water   ??? Unsweetened almond milk- low in sugar and calories but has a nice flavor to it   ??? If using milk, only use skim milk (but remember it has about 12g of carbohydrates, so consider mixing 1/2 water and 1/2 milk)  ??? Can easily add in psyllium for extra fiber   ??? Can make creamier with a large spoonful of nonfat greek yogurt      Ways to add more flavor to your shakes:  1) Mix with unsweetened almond milk or coffee  2) Extracts (limit to 1 teaspoon)  ??? Almond, black walnut, hazelnut  ??? Banana, cherry, lemon, pineapple, raspberry, strawberry  ??? Chocolate, vanilla coconut, maple, peppermint   3) Blend with ice  4) Can consider fruit, but no more than 1 cup or 1 medium piece of fruit    Protein Foods  (140 Calorie Average per serving) Food Item One Serving Calories Protein (gm)   Breakfast      Egg whites 7 whites 115 25   Nonfat cottage cheese 1 cup 140 28   Greek yogurt 1 cup 120 20   Protein shakes 1 scoop whey/pea/soy powder + almond milk 200 25   Lunch and Dinner      [place] Breast  3 ounces, cooked weight  135 25   Chicken Breast 3 ounces, cooked weight  140 25   Lean Red Meat 3 ounces, cooked weight 145-160 25   Ocean-Caught Fish  4 ounces, cooked weight 130-170 25-31   Shrimp, crab, lobster 4 ounces, cooked weight 120 22-24   Tuna 4 ounces, water pack 145 27   Scallops 4 ounces, cooked weight 135 25   Vegetarian      Soy Beans-Edamame              ??? cup 90 9   Tofu, firm ???  cup 180 20 (varies)   Protein Powders      Whey 1 scoop 130 20-25   Pea 1 scoop 130 20-25   Soy  1 scoop 130 20-25   Rice  1 scoop 120-150 20-25   Egg white 1 scoop 150 25-30  Hemp 1 scoop 150-200 25-30   Protein Snacks      Cheese, reduced fat 1 ounce 50-80 7   Cheese, Parmesan  3 Tablespoons 80 6   Cheese, Mozzarella, lowfat 1 ounce (1 stick) 70 6

## 2018-05-16 ENCOUNTER — Ambulatory Visit: Payer: BLUE CROSS/BLUE SHIELD

## 2018-05-17 ENCOUNTER — Telehealth: Payer: BLUE CROSS/BLUE SHIELD

## 2018-05-17 ENCOUNTER — Ambulatory Visit: Payer: BC Managed Care – POS | Attending: Vascular Surgery

## 2018-05-17 DIAGNOSIS — I781 Nevus, non-neoplastic: Secondary | ICD-10-CM

## 2018-05-17 NOTE — Telephone Encounter
Please let pt know that the results of DEXA show Osteoporosis. My recommendation is for patient to see Dr. Lysbeth Penner  for further recommendations on treatment

## 2018-05-17 NOTE — Telephone Encounter
Called and spoke with pt.  Pt will get in touch with Dr. Lysbeth Penner in regards to further treatment for Osteoporosis per Dr. Tanna Furry recommendation.   Pt understood and adheres on MD recommendation.   No further assistance is needed by pt at this time.

## 2018-05-18 NOTE — Progress Notes
55ml of Polidocanol  Solution 0.75% was administered to left leg by Dr Burnett Kanaris.  The procedure was tolerated well.  Discharge and follow up instructions were given

## 2018-05-19 ENCOUNTER — Telehealth: Payer: MEDICARE

## 2018-05-26 ENCOUNTER — Ambulatory Visit: Payer: BC Managed Care – POS | Attending: Hand Surgery

## 2018-05-28 ENCOUNTER — Ambulatory Visit: Payer: MEDICARE | Attending: Hand Surgery

## 2018-06-02 ENCOUNTER — Inpatient Hospital Stay: Payer: BC Managed Care – POS | Attending: Hand Surgery

## 2018-06-02 ENCOUNTER — Ambulatory Visit: Payer: BLUE CROSS/BLUE SHIELD | Attending: Hand Surgery

## 2018-06-02 ENCOUNTER — Ambulatory Visit: Payer: BLUE CROSS/BLUE SHIELD

## 2018-06-02 DIAGNOSIS — M67442 Ganglion, left hand: Secondary | ICD-10-CM

## 2018-06-02 NOTE — Consults
DATE OF SERVICE:  06/02/2018      PRIMARY PHYSICIAN:  Marylu Lund P. Pregler, MD    REFERRING PHYSICIAN:  Mihaela B. Ladona Ridgel, MD    CHIEF COMPLAINT:  Left middle finger nail    HISTORY THE PRESENT ILLNESS:  Robin Spencer is a 68 year old right-hand dominant female who presents with cysts on the left middle finger for the past year. This began after she injured her finger in a gas pump a few years ago. The ulnar sided cyst is worse than the radial. The radial cyst is proximal without nail ridging. The ulnar cyst is distal with nail ridging. No infection. No numbness or tingling.    PAST MEDICAL HISTORY:  Past Medical History:   Diagnosis Date   ??? Anxiety    ??? Breast cancer (HCC/RAF)    ??? Eczema    ??? Fibroids    ??? GERD (gastroesophageal reflux disease)     esophagitis   ??? History of migraine headaches    ??? History of radiation therapy 04/2016    L breast   ??? Otosclerosis    ??? Post-operative nausea and vomiting    ??? Rectal fissure    ??? Vitreous detachment        PAST SURGICAL HISTORY:  Past Surgical History:   Procedure Laterality Date   ??? BREAST BIOPSY  1986    Fibroadenoma, ductal hyperplasia   ??? Laparoscopy for subfertility     ??? Stapedectomy, right         MEDICATIONS:  Current Outpatient Medications   Medication Sig   ??? acyclovir 400 mg tablet Take 1 tablet (400 mg total) by mouth two (2) times daily.   ??? BIOTIN PO Take 1 tablet by mouth daily .     ??? CALCIUM CITRATE PO Take 630 mg by mouth daily.   ??? Cholecalciferol (VITAMIN D3) 5000 units CAPS Take 1 capsule by mouth daily.   ??? cyanocobalamin 500 mcg tablet Take 500 mcg by mouth daily.   ??? cyclobenzaprine 5 mg tablet Take 1 tablet (5 mg total) by mouth at bedtime.   ??? estradiol (VAGIFEM) 10 mcg vaginal tablet Place 1 tablet (10 mcg total) vaginally two (2) times a week.   ??? MAGNESIUM CITRATE PO Take 150 mg by mouth two (2) times daily.   ??? Omega-3 Fatty Acids (FISH OIL PO) Take 1,400 mg by mouth two (2) times daily. ??? tamoxifen 20 mg tablet Take 1 tablet (20 mg total) by mouth daily.   ??? triamcinolone 0.5% cream Apply topically as needed for.   ??? UNABLE TO FIND Med Name: Promedley supplement- Epimedium, Tumeric, Japanese, Fleeceflower, EPA/DHA .     ALLERGIES:  Rash from tylenol and aspirin  PCN  Ibuprofen    SOCIAL HISTORY:  Denies smoking. No illicit drug use.  Denies alcohol use.  She is married, two children.  Hobbies include N/A.    FAMILY HISTORY:  Mother: uterine cancer, hypothyroidism  Father: prostate cancer  Sibling: prostate cancer    Otherwise, the patient denies a family history of heart disease, diabetes, other cancers, stroke, or any other major medical problems.     REVIEW OF SYSTEMS:  A 14-point review of systems was documented in the chart and negative except for seasonal allergies, leg cramps with exertion, heat intolerance. Denies chest pain, shortness of breath, or gastrointestinal/genitourinary symptoms.    PHYSICAL EXAMINATION:  Pulse 88  ~ Temp 36 ???C (96.8 ???F) (Oral)  ~ Ht 5' 4'' (1.626 m)  ~ Rowe  136 lb 4.8 oz (61.8 kg)  ~ LMP  (LMP Unknown)  ~ BMI 23.40 kg/m???   General Appearance:  Resting comfortably and in no acute distress  Head Eyes Ear Nose and Throat: EOMI, Anicteric  Pulmonary: Clear; non labored breathing  Cardiovascular: pulses palpable and regular     The left elbow has good range of motion, without tenderness or crepitation on detailed examination. The epicondyles are nontender. The collateral ligaments of the elbow joint are stable on stress testing. The radial head and neck are both nontender. The proximal forearm compartments are soft and nontender, without compartment syndrome. The distal radius is nontender. The distal ulna is nontender. The DRUJ test is negative for instability or crepitation. The carpus is nontender. Watson's test is negative. All extensor and flexor tendons are intact. The fingers are pink and well perfused. Sensation is normal. No triggering. The fingers have full range of motion. The left middle finger has an ulnar distal mucus cyst with ridging and a radial proximal mucus cyst without ridging.    DIAGNOSIS:  Left middle finger DIP ulnar and radial mucus cysts     DISCUSSION:  The patient has a left middle finger mucous cyst. The options for treatment of the mucous cyst were discussed with the patient in detail today, including observation and surgical excision of the cyst. The patient and I discussed the relative advantages and disadvantages of the various treatment options in detail today. The patient has elected to undergo treatment with a surgical excision of the cyst. The patient understands and accepts the indications, alternatives, and potential complications of the surgery. A requisition for surgery has been submitted. The patient will schedule for surgery at a mutually convenient time.    SCRIBE ATTESTATION:  Cecille Amsterdam, was the scribe for patient Rochel Privett  on 10:19 AM 06/02/2018 in the presence of Dr. Tilda Franco.    Tilda Franco, MD [phone])

## 2018-06-04 ENCOUNTER — Ambulatory Visit: Payer: BC Managed Care – POS

## 2018-06-07 ENCOUNTER — Telehealth: Payer: BLUE CROSS/BLUE SHIELD | Attending: "Endocrinology

## 2018-06-07 DIAGNOSIS — E349 Endocrine disorder, unspecified: Secondary | ICD-10-CM

## 2018-06-07 DIAGNOSIS — M810 Age-related osteoporosis without current pathological fracture: Secondary | ICD-10-CM

## 2018-06-07 NOTE — Progress Notes
OUTPATIENT ENDOCRINOLOGY CONSULTATION    Date of Service: 06/07/2018  Referring Provider: Lorenza Chick., MD  PMD: Pregler, Weyman Croon., MD    Chief Complaint:   Osteoporosis     History of Present Illness:   Robin Spencer is a 68 y.o. female who has a past medical history of Anxiety, Breast cancer (HCC/RAF), Eczema, Fibroids, GERD (gastroesophageal reflux disease), History of migraine headaches, History of radiation therapy (04/2016), Otosclerosis, Post-operative nausea and vomiting, Rectal fissure, and Vitreous detachment. and presents to Endocrinology Clinic for Osteoporosis.    Osteoporosis  The patient was first found to have low bone density in 2013.    Fibromyalgia diagnosed by rheumatologist Dr. Ladona Ridgel, but pt does not feel like her pain is debilitating.    Prev on bioidentical hormones for HRT - stopped in 01/2016 or so upon dx of breast cancer.  Now on tamoxifen.    Interval Events:  Last seen 01/2017    Telephone visit today    No falls or fractures   doing well   Exercising regularly - lots of walking     Remains on tamoxifen    Vitamin D3 5000 IU daily   Calcium 600 mg daily  Also taking Mg 150 BID.  Dietary calcium: 1 cup yogurt, lots of kale/spinach daily in smoothie, string cheese, 1/4 cup almond milk      01/2017 thyroid US showed no nodules    Treatment History   Dates Comments    [x]  No treatment to date            Risk Factors for Accelerated Bone Loss        History of fracture  []  Yes  [x]  No    History of falls  []  Yes  [x]  No    History of malabsorption  []  Yes  [x]  No    History of nephrolithiasis  []  Yes  [x]  No    History of rheumatoid arthritis  []  Yes  [x]  No    History of hyperthyroidism  []  Yes  [x]  No    History of Cushing's  []  Yes  [x]  No    Family history of osteoporosis  [x]  Yes  []  No Osteopenia in mother   Prior/current glucocorticoid use  []  Yes  [x]  No    Antiepilepsy drugs  []  Yes  [x]  No    PPI  []  Yes  []  No Prilosec in the past for weeks only Aromatase inhibitors  []  Yes  [x]  No    Androgen deprivation therapy  []  Yes  [x]  No    EtOH  []  Yes  []  No Rare alcohol   Tobacco  []  Yes  [x]  No      Past Medical History:     Past Medical History:   Diagnosis Date   ??? Anxiety    ??? Breast cancer (HCC/RAF)    ??? Eczema    ??? Fibroids    ??? GERD (gastroesophageal reflux disease)     esophagitis   ??? History of migraine headaches    ??? History of radiation therapy 04/2016    L breast   ??? Otosclerosis    ??? Post-operative nausea and vomiting    ??? Rectal fissure    ??? Vitreous detachment        Past Surgical History:     Past Surgical History:   Procedure Laterality Date   ??? BREAST BIOPSY  1986    Fibroadenoma, ductal hyperplasia   ??? Laparoscopy for subfertility     ???  Stapedectomy, right       Medications:     Current Outpatient Medications   Medication Sig   ??? acyclovir 400 mg tablet Take 1 tablet (400 mg total) by mouth two (2) times daily.   ??? BIOTIN PO Take 1 tablet by mouth daily .     ??? CALCIUM CITRATE PO Take 630 mg by mouth daily.   ??? Cholecalciferol (VITAMIN D3) 5000 units CAPS Take 1 capsule by mouth daily.   ??? cyanocobalamin 500 mcg tablet Take 500 mcg by mouth daily.   ??? cyclobenzaprine 5 mg tablet Take 1 tablet (5 mg total) by mouth at bedtime.   ??? estradiol (VAGIFEM) 10 mcg vaginal tablet Place 1 tablet (10 mcg total) vaginally two (2) times a week.   ??? MAGNESIUM CITRATE PO Take 150 mg by mouth two (2) times daily.   ??? Omega-3 Fatty Acids (FISH OIL PO) Take 1,400 mg by mouth two (2) times daily.   ??? tamoxifen 20 mg tablet Take 1 tablet (20 mg total) by mouth daily.   ??? triamcinolone 0.5% cream Apply topically as needed for.   ??? UNABLE TO FIND Med Name: [place] supplement- Epimedium, Tumeric, Japanese, Fleeceflower, EPA/DHA .     No current facility-administered medications for this visit.      Allergies:     Allergies   Allergen Reactions   ??? Penicillins Other (See Comments) and Rash     Ampicillin, flesh rash in 1980s   ??? Menadiol Sodium Diphosphate Rash ??? Other      [place] coloring on meds such as advil- not allergic to actual med- just the coloring   ??? Acetaminophen Rash     Social History:    reports that she has never smoked. She has never used smokeless tobacco. She reports that she does not drink alcohol or use drugs.   Social History     Social History Narrative    Patient is married, and has two children. She is of Ashkenazi Agricultural engineer.        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 Hypertension Father    ??? Stroke Father    ??? Hyperlipidemia Father    ??? Depression Father    ??? Prostate cancer Father 32   ??? Hypertension Mother    ??? Asthma Mother    ??? Endometrial cancer Mother 5   ??? Hyperthyroidism Mother    ??? Prostate cancer Brother 62   ??? Breast cancer Neg Hx    ??? Ovarian cancer Neg Hx    ??? Malignant hypertension Neg Hx      Review of Systems:   14 system ROS negative except as described above and in the HPI.  Physical Examination:   Vital Signs: LMP  (LMP Unknown)  There is no height or weight on file to calculate BMI.   Wt Readings from Last 3 Encounters:   06/02/18 136 lb 4.8 oz (61.8 kg)   05/14/18 136 lb 4.8 oz (61.8 kg)   05/10/18 136 lb (61.7 kg)      No exam    Laboratory Results:   I have   [] reviewed radiology,  [] reviewed labs,  [] reviewed &  summarized old records, [] requested outside medical records.    Lab Results   Component Value Date    CALCIUM 9.2 05/10/2018    CALCIUM 9.2 05/10/2018    ICALCOR 1.11 05/10/2018    CREAT 0.72 05/10/2018    ALBUMIN 4.6 05/10/2018    CREATRANUR 42 01/22/2017    PTHINT 64 (H) 05/10/2018    VITD25OH 43 05/10/2018    TSH 2.0 05/10/2018    T4AUTO 1.3 01/22/2017    NTXRANUR 22 01/22/2017    OSTEOCALCIN 15 01/22/2017    ALKPHOS 56 05/10/2018       Lab Results   Component Value Date    WBC 3.76 (L) 09/23/2016    HGB 13.1 09/23/2016    HCT 37.3 09/23/2016 MCV 99.7 (H) 09/23/2016    PLT 202 09/23/2016       Bone Turnover Markers  Date UNTx BSAP Serum OC                Studies:   DXA 05/13/18 Lenell Antu)   Assessment:  1. The left hip T-score of -2.1 meets the criteria for osteopenia and reflects a change of +1.3% from the baseline exam of 04/2016.  The femoral neck T-score of -2.2 is consistent with osteopenia and reflects a change of +1.6%.  2. The right hip and femoral neck T-score's of -2.0 and -2.1 respectively, meet the criteria for osteopenia and represent the right femur baseline.  3. The PA lumbar spine T-score of -2.6 meets the criteria for osteoporosis according to the [facility] classification and reflects a change of +1.1%.    4. The *lateral lumbar spine T-score = n/a  *The lateral lumbar spine view provides a sensitive measure of trabecular bone and is primarily to be used to evaluate response to treatment. A low T-score in the lateral lumbar view alone does NOT fulfill the diagnostic criteria for osteopenia or osteoporosis. (ISCD Practice Recommendations 2006)   Z-scores used in reporting BMD for children/adolescents 5-19, pre-menopausal females and males < 50  ???    DXA 04/17/16 Lenell Antu)  Assessment:  1. The left hip and femoral neck T-score's of -2.2 and -2.3 respectively, meet the criteria for osteopenia.  2. The PA lumbar spine T-score of -2.7 meets the criteria for osteoporosis according to the [facility] classification.    3. The *lateral lumbar spine T-score = n/a    DXA 11/03/11 ([facility])  BASELINE DUAL ENERGY X-RAY ABSORPTIOMETRY (DXA) STUDY  Bone densitometry was performed using a Hologic Discovery A bone densitometer.  ???  PA Spine:       The average bone mineral density from L1-4 is 0.787 g/cm2, 2.4 standard deviations below the mean for normal young adults (*T-scores) or 75% of normal. Compared to age and sex-matched controls (**Z-scores), this density is 0.9 standard deviations   below the mean.  ???  Total Hip: The bone mineral density in the left total hip is 0.706 g/cm2, 1.9 standard deviations below the mean for normal young adults (*T-scores) or 75% of normal.  Compared to age and sex-matched controls (**Z-scores), this density is 1.0 standard   deviations below the mean.  ???  Femoral Neck:       The bone mineral density in the left femoral neck is 0.531 g/cm2, 2.9 standard deviations below the mean for normal young adults (*T-scores) or 63% of normal.  Compared to age and sex-matched controls (**Z-scores), this density is 1.6 standard   deviations below the mean.  ???  *T-score compares the BMD of an  individual to peak bone mass and expresses the difference as a standard deviation score,**Z-score compares the BMD of an individual with age-matched, gender-matched, and race-matched controls and expresses the difference   as a standard deviation score.  ???  IMPRESSION:  1.  This is a baseline study at the [facility].  2.  The patient has osteoporosis.    Results for orders placed or performed during the hospital encounter of 02/11/17   US thyroid-parathyroid    Narrative    US THYROID PARATHYROID     CLINICAL HISTORY: thyroid nodule.    COMPARISON: None.    TECHNIQUE: Utilizing high resolution ultrasound transducers, real time 2D sonography and limited color Doppler imaging of the neck was performed.    FINDINGS:      The right thyroid lobe measures 4.7 x 1.1 x 1.1 cm and the left thyroid lobe measures 3.5 x 1.0 x 1.3 cm. The isthmus measures 2 mm in AP dimension.      The background thyroid parenchyma is homogeneous. Blood flow in the gland is normal on color Doppler.    No nodules are seen.    No parathyroid nodule is identified.    No morphologically abnormal or enlarged cervical chain lymph nodes.      Impression    IMPRESSION:    Normal ultrasound of the thyroid.      *From the American Thyroid Association 2015 Management Guidelines for Adult Patients with Thyroid Nodules: High suspicion nodules: Recommend FNA if > 1 cm (Strong recommendation, Moderate-quality evidence);  Intermediate suspicion nodules: Recommend FNA if > 1 cm (Strong recommendation, Low-quality evidence);  Low suspicion nodules: Recommend FNA if > 1.5 cm (Weak recommendation, Low-quality evidence);  Very low suspicion nodules: Consider FNA if > 2 cm versus observation (Weak recommendation, Moderate-quality evidence);  Benign nodules: No biopsy (Strong recommendation, Moderate-quality evidence).    Dictated by: Mohammed Suhail   02/11/2017 12:02 PM    Signed by: Roanna Raider RAGAVENDRA   02/11/2017 12:56 PM             DEXA  Date LS T-score FN T-score TH T-score Rad T-score % Change BMD                Assessment:   Kristyana Notte is a 68 y.o. female seen in the Endocrine clinic for evaluation and management of osteoporosis.    Plan:   1. Osteoporosis, unspecified osteoporosis type, unspecified pathological fracture presence: diagnosed in 2013 on DXA, about stable on DXA in 2018 but on different machine.  DXA 2020 shows stability in BMD as well. Risk factors for accelerated bone loss: +fam hx, post-menopausal woman, Caucasian, thin.  - pt is hesitant to start medication and agree to hold off on now given stability of BMD.  - PTH,Intact & Calcium; Future  - Vitamin D,25-Hydroxy; Future  - Encouraged weight bearing exercise  - Goal calcium intake is 1200 mg of elemental calcium per day, ideally more in diet than in supplement.  - Goal vitamin D intake is 800 IU of vitamin D3 per day. Goal vit D 25 OH level is >30.  - Next DXA is planned for 04/2020    2. Hyperparathyroidism: Elevated PTH and normal calcium.  Differential diagnoses include secondary hyperparathyroidism (vitamin D deficiency, hypercalciuria, decreased renal function, low calcium intake) vs ''normocalcemic'' primary hyperparathyroidism. 24 hr urine calcium on the low side at 88 in 2018  - calcium 600 mg daily  - dietary calcium intake discussed - continue D3  5000 IU daily  - repeat PTH, Rudolph next year    3. Invasive ductal carcinoma of left breast, ER+PR+HER2-  - on Tamoxifen        There are no Patient Instructions on file for this visit.  No follow-ups on file. RTC 1 year    15 minutes spent with patient. Greater than 50% of the visit was spent counseling the above issues.    Author:  Lorenza Chick, MD 06/07/2018 2:28 PM

## 2018-06-11 ENCOUNTER — Ambulatory Visit: Payer: BLUE CROSS/BLUE SHIELD

## 2018-06-14 MED ORDER — TAMOXIFEN CITRATE 20 MG PO TABS
20 mg | ORAL_TABLET | Freq: Every day | ORAL | 3 refills | Status: AC
Start: 2018-06-14 — End: ?

## 2018-06-18 ENCOUNTER — Inpatient Hospital Stay: Payer: BC Managed Care – POS | Attending: Radiation Oncology

## 2018-06-18 ENCOUNTER — Ambulatory Visit: Payer: BLUE CROSS/BLUE SHIELD

## 2018-06-18 DIAGNOSIS — C50812 Malignant neoplasm of overlapping sites of left female breast: Secondary | ICD-10-CM

## 2018-06-18 DIAGNOSIS — Z170 Estrogen receptor positive status [ER+]: Secondary | ICD-10-CM

## 2018-06-18 NOTE — Consults
BREAST FOLLOW-UP OFFICE VISIT NOTE    ???PATIENT: Robin Spencer  ???MRN: [id number]  ???DOB: 12-03-1950    ???DATE OF SERVICE: 06/18/2018    ???REFERRING PRACTITIONER: Cyril Loosen., MD  ???PRIMARY CARE PROVIDER: Pregler, Weyman Croon., MD  ???RESIDENT PHYSICIAN: Maudie Mercury, MD, PhD  ???ATTENDING PHYSICIAN: Kennedy Bucker, MD     ???CHIEF COMPLAINT: 12 month follow up ~ 2 year and 2 month since completion of RT    ???IDENTIFYING DATA: Robin Spencer is a 68 y.o. female with a pT2N0(i-), Grade 1, ER+, PR-, Her2- IDC of the left breast s/p BCS+SNB on 02/29/16. She completed RT 04/24/16, on tamoxifen.    ???RADIATION HISTORY: She received 42.56 Gy in 16 fractions to the breast followed by a boost of 10 Gy in 4 fractions to the lumpectomy cavity. Radiation was completed on 04/24/2016.     ???DATA OF INTERVAL HISTORY:  ??? Breast-FU Data Fields ~ Data ~ Comments    ??? Most recent imaging date (m/d/yyyy) ~ 12/30/17 MRI   12/21/17 left [place]   11/24/17 b/l mammo [place] ~     ??? Local Failure (LF) ~  No Local Failure ~     ??? If LF, date (m/d/yyyy) ~  ~     ??? Regional failure (RF) ~  No Regional Failure ~     ??? If RF, date (m/d/yyyy) ~  ~     ??? Distant Mets (DM) ~  No Distant Metastases ~     ??? If DM, date (m/d/yyyy) ~  ~     ??? Any failure within RT field? ~  No ~     ??? RT related complications ~  No ~     ??? Complications ~  ~     ??? Highest complication grade  (CTCAE) ~  ---- ~  ???     Subjective:       Interval History: Robin Spencer returns for routine posttreatment follow up since completing radiation approximately 2 year and 2 months ago.     Today, she is feeling well. Regarding the treated left breast, she reports that the breast and especially NAC appear paler, which has been stable. The treated breast was fuller/heavier and more tender compared to the right breast, which is slowly improving. No new breast pain. However, the skin over the left breast feels different and slightly more sensitive compared to the right. ROM of left upper extremity is tight but reports full range of motion.     She denies cough, shortness of breath, swelling in the arm, residual fatigue, or headaches. She is staying very active, enjoying ballroom dancing over Zoom, and celebrated her anniversary recently.     On tamoxifen and following with Dr Maye Hides, last seen 02/25/17 and last communication 06/11/18. She is tolerating it well, and previous hot flashes have subsided considerably on its own and tolerable.    Screening b/l mammo, tomo + b/l [place] 11/24/17  MAMMOGRAM FINDINGS:     The breast tissue is extremely dense, which lowers the sensitivity of mammography (density D).     Finding 1:  There are stable post-lumpectomy changes seen in the left breast.     In the right breast, no suspicious masses, calcifications or other abnormalities are seen.     No significant interval change is identified.     ULTRASOUND FINDINGS:     Finding 1:  Whole breast and axillary sonography was performed in both breasts.     Ultrasound  demonstrates a post lumpectomy scar seen in the left breast at 12:00.  However the scar  appears more prominent and further evaluation is warranted at this time.     Finding 2:  There is a post surgical scar seen in the right breast.     No axillary lymphadenopathy.     IMPRESSION:     ???  Finding 1:  Possibly more prominent lumpectomy scar at 12:00. A breast ultrasound exam is  recommended.     Finding 2:  There is no mammographic evidence of malignancy.     BI-RADS Category 0:  Incomplete: Needs Additional Imaging Evaluation    MAMMOGRAM FINDINGS:     The breast tissue is extremely dense, which lowers the sensitivity of mammography (density D).     Finding 1:  There are stable post-lumpectomy changes seen in the left breast.     In the right breast, no suspicious masses, calcifications or other abnormalities are seen.     No significant interval change is identified.     ULTRASOUND FINDINGS: Finding 1:  Whole breast and axillary sonography was performed in both breasts.     Ultrasound demonstrates a post lumpectomy scar seen in the left breast at 12:00.  However the scar  appears more prominent and further evaluation is warranted at this time.     Finding 2:  There is a post surgical scar seen in the right breast.     No axillary lymphadenopathy.     IMPRESSION:     ???  Finding 1:  Possibly more prominent lumpectomy scar at 12:00. A breast ultrasound exam is  recommended.     Finding 2:  There is no mammographic evidence of malignancy.     BI-RADS Category 0:  Incomplete: Needs Additional Imaging Evaluation    Left breast U/S, dx 12/21/17  ULTRASOUND FINDINGS:     The left breast 12:00 surgical scar is associated with architectural distortion and two hypoechoic  areas with surrounding echogenic tissue and surgical clips spanning 4 cm.  No associated  vascularity. The findings are likely related to a prominent post surgical scar, however prior  ultrasound images show a more typical, smaller scar.  MRI of the breasts is recommended for further  evaluation.     No axillary lymphadenopathy.     IMPRESSION:     ???  Post surgical scar in the left breast at 12 o'clock, upper outer quadrant requires additional  evaluation. A breast MRI is recommended. The patient was informed of the exam results by Dr. Leonard Schwartz  before leaving the facility.     MRI breast 12/30/17  FINDINGS:   ???  GENERAL:  -Breast density: Extreme fibroglandular tissue  -Background parenchymal enhancement: Minimal  ???  RIGHT BREAST:  -Small enhancing oval mass with circumscribed margins in the right lateral breast (post-contrast series, image 121) is stable in comparison to 02/05/2016 and benign in appearance  -No suspicious masses, lymph nodes, or areas of abnormal enhancement  ???  LEFT BREAST:  -Postsurgical changes at 12:00 without abnormal enhancement  -No suspicious masses, lymph nodes, or areas of abnormal enhancement  ???  ADDITIONAL:  -None.  ??? ???  IMPRESSION:   ???  RIGHT BREAST: CATEGORY 1 - NEGATIVE.    ???  LEFT BREAST: No abnormal enhancement in the region of the post-surgical scar. CATEGORY 2 - BENIGN FINDINGS.  ???  OVERALL ASSESSMENT: CATEGORY 2 - BENIGN FINDINGS. Recommend routine annual mammography.  PERTINENT PAST MEDICAL HISTORY:   Past Medical History:   Diagnosis Date   ??? Anxiety    ??? Breast cancer (HCC/RAF)    ??? Eczema    ??? Fibroids    ??? GERD (gastroesophageal reflux disease)     esophagitis   ??? History of migraine headaches    ??? History of radiation therapy 04/2016    L breast   ??? Otosclerosis    ??? Post-operative nausea and vomiting    ??? Rectal fissure    ??? Vitreous detachment      Past Surgical History:   Procedure Laterality Date   ??? BREAST BIOPSY  1986    Fibroadenoma, ductal hyperplasia   ??? Laparoscopy for subfertility     ??? Stapedectomy, right         GYN HISTORY:   OB History   Obstetric Comments   Menses started: 38   Gravida: 0   Para:  0   Age of first live birth: n/a    Breast feed: n/a    Contraceptives/Type:  Y, 18-30    Menopause reached at: 61   Hormone replacement/Type: Y, 48-64    Fertility Tx: Y        ALLERGIES:   Allergies as of 06/18/2018 - Review Complete 06/02/2018   Allergen Reaction Noted   ??? Penicillins Other (See Comments) and Rash 01/08/2012   ??? Menadiol sodium diphosphate Rash 03/19/2016   ??? Other  02/29/2016   ??? Acetaminophen Rash 03/02/2016     MEDICATIONS:   Current Outpatient Medications   Medication Sig   ??? acyclovir 400 mg tablet Take 1 tablet (400 mg total) by mouth two (2) times daily.   ??? BIOTIN PO Take 1 tablet by mouth daily .     ??? CALCIUM CITRATE PO Take 630 mg by mouth daily.   ??? Cholecalciferol (VITAMIN D3) 5000 units CAPS Take 1 capsule by mouth daily.   ??? cyanocobalamin 500 mcg tablet Take 500 mcg by mouth daily.   ??? cyclobenzaprine 5 mg tablet Take 1 tablet (5 mg total) by mouth at bedtime.   ??? estradiol (VAGIFEM) 10 mcg vaginal tablet Place 1 tablet (10 mcg total) vaginally two (2) times a week.   ??? MAGNESIUM CITRATE PO Take 150 mg by mouth two (2) times daily.   ??? Omega-3 Fatty Acids (FISH OIL PO) Take 1,400 mg by mouth two (2) times daily.   ??? tamoxifen 20 mg tablet Take 1 tablet (20 mg total) by mouth daily.   ??? triamcinolone 0.5% cream Apply topically as needed for.   ??? UNABLE TO FIND Med Name: [place] supplement- Epimedium, Tumeric, Japanese, Fleeceflower, EPA/DHA .     No current facility-administered medications for this visit.        SOCIAL HX:   Social History     Socioeconomic History   ??? Marital status: Married     Spouse name: Not on file   ??? Number of children: Not on file   ??? Years of education: Not on file   ??? Highest education level: Not on file   Occupational History   ??? Not on file   Social Needs   ??? Financial resource strain: Not on file   ??? Food insecurity:     Worry: Not on file     Inability: Not on file   ??? Transportation needs:     Medical: Not on file     Non-medical: Not on file   Tobacco Use   ???  Smoking status: Never Smoker   ??? Smokeless tobacco: Never Used   Substance and Sexual Activity   ??? Alcohol use: No     Alcohol/week: 0.0 oz   ??? Drug use: No   ??? Sexual activity: Yes     Partners: Male     Birth control/protection: None   Lifestyle   ??? Physical activity:     Days per week: Not on file     Minutes per session: Not on file   ??? Stress: Not on file   Relationships   ??? Social connections:     Talks on phone: Not on file     Gets together: Not on file     Attends religious service: Not on file     Active member of club or organization: Not on file     Attends meetings of clubs or organizations: Not on file     Relationship status: Not on file   Other Topics Concern   ??? Not on file   Social History Narrative    Patient is married, and has two children. She is of Ashkenazi Agricultural engineer.        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 Hypertension Father    ??? Stroke Father    ??? Hyperlipidemia Father    ??? Depression Father    ??? Prostate cancer Father 88   ??? Hypertension Mother    ??? Asthma Mother    ??? Endometrial cancer Mother 49   ??? Hyperthyroidism Mother    ??? Prostate cancer Brother 41   ??? Breast cancer Neg Hx    ??? Ovarian cancer Neg Hx    ??? Malignant hypertension Neg Hx       Objective:      Physical Exam:  LMP  (LMP Unknown)  Telemedicine  GENERAL: The patient is a well-developed, well-nourished, female in no acute distress. No conversational dyspnea.    Lab Review / Pathology / Radiology:     See HPI     Assessment:      Robin Spencer is a 68 y.o. female with a pT2N0(i-), Grade 1, ER+, PR-, Her2- IDC of the left breast s/p BCS+SNB on 02/29/16 followed by radiation, on tamoxifen. There is no radiographic evidence for disease recurrence or progression.      Plan/ Recommendation:      - On tamoxifen, following Dr. Maye Hides  - Bilateral mammogram and ultrasound due Sept 2020, requisition already placed; supplemental U/S due to extremely dense breast tissue and mammo occult cancer  - I explained that I would be happy to follow up with her at any time in the future in person, by phone, or by email should she have any questions or concerns. I have requested follow up in Sept 2020 with imaging.    Thank you kindly for allowing me to participate in the care of this lovely patient.       cc Pregler, Weyman Croon., MD  Cyril Loosen., MD    Author: Maudie Mercury 06/17/2018 10:39 PM

## 2018-06-18 NOTE — Progress Notes
BREAST FOLLOW-UP OFFICE VISIT NOTE    ???PATIENT: Robin Spencer  ???MRN: [id number]  ???DOB: 02/24/1951    ???DATE OF SERVICE: 06/18/2018    ???REFERRING PRACTITIONER: Cyril Loosen., MD  ???PRIMARY CARE PROVIDER: Pregler, Weyman Croon., MD  ???RESIDENT PHYSICIAN: Maudie Mercury, MD, PhD  ???ATTENDING PHYSICIAN: Kennedy Bucker, MD     ???CHIEF COMPLAINT: 12 month follow up ~ 2 year and 2 month since completion of RT    ???IDENTIFYING DATA: Robin Spencer is a 68 y.o. female with a pT2N0(i-), Grade 1, ER+, PR-, Her2- IDC of the left breast s/p BCS+SNB on 02/29/16. She completed RT 04/24/16, on tamoxifen.    ???RADIATION HISTORY: She received 42.56 Gy in 16 fractions to the breast followed by a boost of 10 Gy in 4 fractions to the lumpectomy cavity. Radiation was completed on 04/24/2016.     ???DATA OF INTERVAL HISTORY:  ??? Breast-FU Data Fields ~ Data ~ Comments    ??? Most recent imaging date (m/d/yyyy) ~ 12/30/17 MRI   12/21/17 left [place]   11/24/17 b/l mammo [place] ~     ??? Local Failure (LF) ~  No Local Failure ~     ??? If LF, date (m/d/yyyy) ~  ~     ??? Regional failure (RF) ~  No Regional Failure ~     ??? If RF, date (m/d/yyyy) ~  ~     ??? Distant Mets (DM) ~  No Distant Metastases ~     ??? If DM, date (m/d/yyyy) ~  ~     ??? Any failure within RT field? ~  No ~     ??? RT related complications ~  No ~     ??? Complications ~  ~     ??? Highest complication grade  (CTCAE) ~  ---- ~  ???     Subjective:       Interval History:   Robin Spencer is seen in tele-health appointment to reduce her risk of exposure given ongoing COVID-19.     Robin Spencer is called for routine posttreatment follow up since completing radiation approximately 2 year and 2 months ago.     Today, she is feeling well. Regarding the treated left breast, she reports that the breast and especially NAC appear paler, which has been stable. The treated breast was fuller/heavier and more tender compared to the right breast, which is slowly improving. No new breast pain. However, the skin over the left breast feels different and slightly more sensitive compared to the right. ROM of left upper extremity is tight but reports full range of motion.     She denies cough, shortness of breath, swelling in the arm, residual fatigue, or headaches. She is staying very active, enjoying ballroom dancing over Zoom, and celebrated her anniversary recently.     On tamoxifen and following with Dr Maye Hides, last seen 02/25/17 and last communication 06/11/18. She is tolerating it well, and previous hot flashes have subsided considerably on its own and tolerable.    Screening b/l mammo, tomo + b/l [place] 11/24/17  MAMMOGRAM FINDINGS:     The breast tissue is extremely dense, which lowers the sensitivity of mammography (density D).     Finding 1:  There are stable post-lumpectomy changes seen in the left breast.     In the right breast, no suspicious masses, calcifications or other abnormalities are seen.     No significant interval change is identified.     ULTRASOUND  FINDINGS:     Finding 1:  Whole breast and axillary sonography was performed in both breasts.     Ultrasound demonstrates a post lumpectomy scar seen in the left breast at 12:00.  However the scar  appears more prominent and further evaluation is warranted at this time.     Finding 2:  There is a post surgical scar seen in the right breast.     No axillary lymphadenopathy.     IMPRESSION:     ???  Finding 1:  Possibly more prominent lumpectomy scar at 12:00. A breast ultrasound exam is  recommended.     Finding 2:  There is no mammographic evidence of malignancy.     BI-RADS Category 0:  Incomplete: Needs Additional Imaging Evaluation    MAMMOGRAM FINDINGS:     The breast tissue is extremely dense, which lowers the sensitivity of mammography (density D).     Finding 1:  There are stable post-lumpectomy changes seen in the left breast.     In the right breast, no suspicious masses, calcifications or other abnormalities are seen. No significant interval change is identified.     ULTRASOUND FINDINGS:     Finding 1:  Whole breast and axillary sonography was performed in both breasts.     Ultrasound demonstrates a post lumpectomy scar seen in the left breast at 12:00.  However the scar  appears more prominent and further evaluation is warranted at this time.     Finding 2:  There is a post surgical scar seen in the right breast.     No axillary lymphadenopathy.     IMPRESSION:     ???  Finding 1:  Possibly more prominent lumpectomy scar at 12:00. A breast ultrasound exam is  recommended.     Finding 2:  There is no mammographic evidence of malignancy.     BI-RADS Category 0:  Incomplete: Needs Additional Imaging Evaluation    Left breast U/S, dx 12/21/17  ULTRASOUND FINDINGS:     The left breast 12:00 surgical scar is associated with architectural distortion and two hypoechoic  areas with surrounding echogenic tissue and surgical clips spanning 4 cm.  No associated  vascularity. The findings are likely related to a prominent post surgical scar, however prior  ultrasound images show a more typical, smaller scar.  MRI of the breasts is recommended for further  evaluation.     No axillary lymphadenopathy.     IMPRESSION:     ???  Post surgical scar in the left breast at 12 o'clock, upper outer quadrant requires additional  evaluation. A breast MRI is recommended. The patient was informed of the exam results by Dr. Leonard Schwartz  before leaving the facility.     MRI breast 12/30/17  FINDINGS:   ???  GENERAL:  -Breast density: Extreme fibroglandular tissue  -Background parenchymal enhancement: Minimal  ???  RIGHT BREAST:  -Small enhancing oval mass with circumscribed margins in the right lateral breast (post-contrast series, image 121) is stable in comparison to 02/05/2016 and benign in appearance  -No suspicious masses, lymph nodes, or areas of abnormal enhancement  ???  LEFT BREAST:  -Postsurgical changes at 12:00 without abnormal enhancement -No suspicious masses, lymph nodes, or areas of abnormal enhancement  ???  ADDITIONAL:  -None.  ???  ???  IMPRESSION:   ???  RIGHT BREAST: CATEGORY 1 - NEGATIVE.    ???  LEFT BREAST: No abnormal enhancement in the region of the post-surgical scar. CATEGORY 2 - BENIGN FINDINGS.  ???  OVERALL ASSESSMENT: CATEGORY 2 - BENIGN FINDINGS. Recommend routine annual mammography.          PERTINENT PAST MEDICAL HISTORY:   Past Medical History:   Diagnosis Date   ??? Anxiety    ??? Breast cancer (HCC/RAF)    ??? Eczema    ??? Fibroids    ??? GERD (gastroesophageal reflux disease)     esophagitis   ??? History of migraine headaches    ??? History of radiation therapy 04/2016    L breast   ??? Otosclerosis    ??? Post-operative nausea and vomiting    ??? Rectal fissure    ??? Vitreous detachment      Past Surgical History:   Procedure Laterality Date   ??? BREAST BIOPSY  1986    Fibroadenoma, ductal hyperplasia   ??? Laparoscopy for subfertility     ??? Stapedectomy, right         GYN HISTORY:   OB History   Obstetric Comments   Menses started: 46   Gravida: 0   Para:  0   Age of first live birth: n/a    Breast feed: n/a    Contraceptives/Type:  Y, 18-30    Menopause reached at: 15   Hormone replacement/Type: Y, 81-64    Fertility Tx: Y        ALLERGIES:   Allergies as of 06/18/2018 - Review Complete 06/02/2018   Allergen Reaction Noted   ??? Penicillins Other (See Comments) and Rash 01/08/2012   ??? Menadiol sodium diphosphate Rash 03/19/2016   ??? Other  02/29/2016   ??? Acetaminophen Rash 03/02/2016     MEDICATIONS:   Current Outpatient Medications   Medication Sig   ??? acyclovir 400 mg tablet Take 1 tablet (400 mg total) by mouth two (2) times daily.   ??? BIOTIN PO Take 1 tablet by mouth daily .     ??? CALCIUM CITRATE PO Take 630 mg by mouth daily.   ??? Cholecalciferol (VITAMIN D3) 5000 units CAPS Take 1 capsule by mouth daily.   ??? cyanocobalamin 500 mcg tablet Take 500 mcg by mouth daily.   ??? cyclobenzaprine 5 mg tablet Take 1 tablet (5 mg total) by mouth at bedtime. ??? estradiol (VAGIFEM) 10 mcg vaginal tablet Place 1 tablet (10 mcg total) vaginally two (2) times a week.   ??? MAGNESIUM CITRATE PO Take 150 mg by mouth two (2) times daily.   ??? Omega-3 Fatty Acids (FISH OIL PO) Take 1,400 mg by mouth two (2) times daily.   ??? tamoxifen 20 mg tablet Take 1 tablet (20 mg total) by mouth daily.   ??? triamcinolone 0.5% cream Apply topically as needed for.   ??? UNABLE TO FIND Med Name: [place] supplement- Epimedium, Tumeric, Japanese, Fleeceflower, EPA/DHA .     No current facility-administered medications for this encounter.        SOCIAL HX:   Social History     Socioeconomic History   ??? Marital status: Married     Spouse name: Not on file   ??? Number of children: Not on file   ??? Years of education: Not on file   ??? Highest education level: Not on file   Occupational History   ??? Not on file   Social Needs   ??? Financial resource strain: Not on file   ??? Food insecurity:     Worry: Not on file     Inability: Not on file   ??? Transportation needs:  Medical: Not on file     Non-medical: Not on file   Tobacco Use   ??? Smoking status: Never Smoker   ??? Smokeless tobacco: Never Used   Substance and Sexual Activity   ??? Alcohol use: No     Alcohol/week: 0.0 oz   ??? Drug use: No   ??? Sexual activity: Yes     Partners: Male     Birth control/protection: None   Lifestyle   ??? Physical activity:     Days per week: Not on file     Minutes per session: Not on file   ??? Stress: Not on file   Relationships   ??? Social connections:     Talks on phone: Not on file     Gets together: Not on file     Attends religious service: Not on file     Active member of club or organization: Not on file     Attends meetings of clubs or organizations: Not on file     Relationship status: Not on file   Other Topics Concern   ??? Not on file   Social History Narrative    Patient is married, and has two children. She is of Ashkenazi Agricultural engineer.        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 Hypertension Father    ??? Stroke Father    ??? Hyperlipidemia Father    ??? Depression Father    ??? Prostate cancer Father 48   ??? Hypertension Mother    ??? Asthma Mother    ??? Endometrial cancer Mother 51   ??? Hyperthyroidism Mother    ??? Prostate cancer Brother 60   ??? Breast cancer Neg Hx    ??? Ovarian cancer Neg Hx    ??? Malignant hypertension Neg Hx       Objective:      Physical Exam:  LMP  (LMP Unknown)  Telemedicine  GENERAL: The patient is a well-developed, well-nourished, female in no acute distress. No conversational dyspnea.    Lab Review / Pathology / Radiology:     See HPI     Assessment:      Robin Spencer is a 68 y.o. female with a pT2N0(i-), Grade 1, ER+, PR-, Her2- IDC of the left breast s/p BCS+SNB on 02/29/16 followed by radiation, on tamoxifen. There is no radiographic evidence for disease recurrence or progression.      Plan/ Recommendation:      - On tamoxifen, following Dr. Maye Hides  - Bilateral mammogram and ultrasound due Sept 2020, requisition already placed; supplemental U/S due to extremely dense breast tissue and mammo occult cancer  - I explained that I would be happy to follow up with her at any time in the future in person, by phone, or by email should she have any questions or concerns. I have requested follow up in Sept 2020 with imaging.    Thank you kindly for allowing me to participate in the care of this lovely patient.       cc Pregler, Weyman Croon., MD  Cyril Loosen., MD    Author: Maudie Mercury 06/18/2018 10:31 AM

## 2018-06-20 DIAGNOSIS — C50912 Malignant neoplasm of unspecified site of left female breast: Secondary | ICD-10-CM

## 2018-06-30 ENCOUNTER — Ambulatory Visit: Payer: BC Managed Care – POS

## 2018-07-01 ENCOUNTER — Telehealth: Payer: BC Managed Care – POS

## 2018-07-01 NOTE — Telephone Encounter
Bethena Midget - please see message below. Please coordinate with patient for rescheduling.   Thanks,   Beulah Gandy

## 2018-07-01 NOTE — Telephone Encounter
Call Back Request    MD: Dr. Baird Kay     Reason for call back: Patient stated she was scheduled for surgery tomorrow 07/02/18 and would like to reschedule. Please call pt back.    Thank You     Any Symptoms:  []  Yes  [x]  No      ? If yes, what symptoms are you experiencing:    o Duration of symptoms (how long):    o Have you taken medication for symptoms (OTC or Rx):      Patient or caller has been notified of the 24-48 hour turnaround time.

## 2018-07-05 NOTE — Telephone Encounter
Call Back Request    MD:  Dr.Benhaim     Reason for call back:  Patient called in regards to rescheduling the procedure that was cancelled on 07/02/18. Thank you.     Any Symptoms:  []  Yes  [x]  No      ? If yes, what symptoms are you experiencing:    o Duration of symptoms (how long):    o Have you taken medication for symptoms (OTC or Rx):      Patient or caller has been notified of the 24-48 hour turnaround time.

## 2018-07-05 NOTE — Telephone Encounter
Bethena Midget - please see message below - patient may wish to reschedule. OK to schedule for [place], if possible.   Thanks,   Beulah Gandy

## 2018-07-09 ENCOUNTER — Telehealth: Payer: MEDICARE

## 2018-07-09 NOTE — Progress Notes
CLINICAL NUTRITION H&P    PATIENT: Robin Spencer  MRN: [id number]  DOB: 1950/10/03  DATE OF SERVICE: 07/09/2018    REFERRING PRACTITIONER: No ref. provider found  PRIMARY CARE PROVIDER: Pregler, Weyman Croon., MD    REASON FOR REFERRAL: Abnormal weight gain      CHIEF COMPLAINT:  No chief complaint on file.      The following history was generated by an interview with the patient as well as a review of medical records obtained in [facility] and Epic Care Everywhere.    Subjective:     History of Present Illness  Robin Spencer is a 68 y.o. female with a h/o Invasive Ductal Carcinoma - on tamoxifen . The patient presents for weight loss.     When did you first start struggling with your weight? Since past 2 years after starting tamoxifen     Feels she is doing well. Cut out nuts, adjusted fruit intake, cut back on the popcorn. Felt the changes were not as hard as she initial.     Has lost 7-8 lbs.     Post-menopausal.     126 lbs    Exercising regularly.     There is no height or weight on file to calculate BMI.  Ideal body weight: 54.7 kg (120 lb 9.5 oz)  Adjusted ideal body weight: 57.6 kg (126 lb 14 oz)  Wt Readings from Last 10 Encounters:   07/09/18 57.2 kg (126 lb)   06/02/18 61.8 kg (136 lb 4.8 oz)   05/14/18 61.8 kg (136 lb 4.8 oz)   05/10/18 61.7 kg (136 lb)   04/08/18 61.6 kg (135 lb 11.2 oz)   02/26/18 59 kg (130 lb)   08/03/17 58.8 kg (129 lb 9.6 oz)   06/15/17 58.2 kg (128 lb 3.2 oz)   05/08/17 57.6 kg (127 lb)   02/25/17 56.2 kg (123 lb 14.4 oz)       BEVERAGE/FLUIDS 24-HOUR RECALL:    24 Hour Dietary Recalls:    Breakfast:  Smoothie w/ kale spinach green yogurt (2/3) cup, almond milk, banana, pineapple, blueberries, strawberries, mango (with reduced portions) + flax seed + 1/2 teaspoon honey, almond milk    Snack:    Lunch: Eggs/omelet + 7-8 crackers    Snack:       grapes    Dinner:   salad w/ chicken in it; chicken or [place] patty soup w/ meatballs boiled in fish salad, quinoa  + croutons Snack:  string cheese    Beverage:           Labs:        CBC    Lab Results   Component Value Date    WBC 3.76 (L) 09/23/2016    HGB 13.1 09/23/2016    HCT 37.3 09/23/2016    MCV 99.7 (H) 09/23/2016    PLT 202 09/23/2016    BMP    Lab Results   Component Value Date    NA 143 05/10/2018    K 4.5 05/10/2018    CL 102 05/10/2018    CO2 32 (H) 05/10/2018    BUN 19 05/10/2018    CREAT 0.72 05/10/2018    GLUCOSE 98 05/10/2018     Ca/ Mg/ Phos/Zinc/Copper    Lab Results   Component Value Date    CALCIUM 9.2 05/10/2018    CALCIUM 9.2 05/10/2018    ICALCOR 1.11 05/10/2018    ALBUMIN 4.6 05/10/2018      Lipids  Lab Results   Component Value Date    CHOL 210 05/10/2018    TRIGLY 99 04/20/2014    CHOLHDL 71 05/10/2018    NOHDLCHOCAL 164 (H) 04/20/2014    CHOLDLCAL 144 (H) 04/20/2014    HbA1c     No results found for: HGBA1C No results for input(s): GLUCOSE in the last 72 hours. No results for input(s): GLUCOSEPOC in the last 72 hours. No results for input(s): INSULIN, CPEPTIDE in the last 72 hours.  PTH / Vitamin D    Lab Results   Component Value Date    VITD25OH 43 05/10/2018    PTHINT 64 (H) 05/10/2018        LFT    Lab Results   Component Value Date    ALT 15 05/10/2018    AST 19 05/10/2018    ALKPHOS 56 05/10/2018    BILITOT 0.2 05/10/2018     Creatinine    Lab Results   Component Value Date    CREAT 0.72 05/10/2018     Lab Results   Component Value Date    GFR ... 04/20/2014    GFRESTNOAA 88 05/10/2018    GFRESTAA >89 05/10/2018        Endocrine    Lab Results   Component Value Date    TSH 2.0 05/10/2018    T4AUTO 1.3 01/22/2017      B Vitamins    Lab Results   Component Value Date    VITAMINB12 1,123 (H) 09/23/2016    FOLATE 20.6 06/26/2016            Iron Panel    No results found for: RETICCNT, FE, TIBC, FEBINDSAT, FERRITIN, HAPTOGLOB, LDH, EPO Fat Soluble Vitamins    Lab Results   Component Value Date    INR 1.0 12/28/2015          Assessment: 1. Sarcopenia   Body composition done: %body fat 39; BMR 1150 cal   2. Invasive ductal carcinoma of left breast, ER+PR+HER2-   On tamoxefen - likely contributing to weight gain    3. Menopause   Stable - increase in muscle loss as evidenced in body composition.         There is no height or weight on file to calculate BMI.    Starting weight (07/09/2018):   There were no vitals filed for this visit.    Plan/ Recommendation:     Continue current plan.     Calorie goal: 1200  Protein goal: 80 grams    Reduce grapes     Activity: Continue pilates, yoga and hiking     Medications: Does not qualify for weight loss medications at this time.    Starches: whole grains, beans/legumes or starchy vegetables  - Whole grains: [place] rice, quinoa, oats (steel cut or rolled, not instant), bulgar, kamut  - Starchy vegetables: peas, corn, potatoes, squash  - Beans/legumes (including soybeans)    Fruit  - One-three serving is 1 medium-sized fruit, 1 cup of fruit, or 1/2 large banana  - Avoid dried fruit and fruit juices    Protein  - One serving is ~3-4 oz.  - Lean protein sources: fish, chicken, [place], whey protein, non-fat dairy (greek yogurt), eggs (1 yolk ) + egg whites    Vegetables (non-starchy)  -One serving of vegetables is:  --- 1 cup raw vegetables  --- 1/2 cup cooked vegetables  --- 2 cups leafy vegetables  - Non-starchy vegetables: broccoli, carrots, green beans, spinach, etc. The above  plan of care, diagnosis, orders, and follow-up were discussed with the patient.  Questions related to this recommended plan of care were answered.      Author:   Conni Elliot, MD  Assistant Professor, Department of Medicine - Clinical Nutrition  [phone]    Patient consented to telephone visit.  Visit is being conducted by phone due to COVID-19 outbreak and current recommendations to avoid close contact.  Patient does not have an urgent medical concern that needs to be addressed in person.

## 2018-07-11 DIAGNOSIS — M6284 Sarcopenia: Secondary | ICD-10-CM

## 2018-07-13 ENCOUNTER — Telehealth: Payer: BLUE CROSS/BLUE SHIELD

## 2018-07-19 ENCOUNTER — Ambulatory Visit: Payer: BC Managed Care – POS

## 2018-08-02 NOTE — Telephone Encounter
Thanks very much.   Griffon Herberg

## 2018-08-03 ENCOUNTER — Ambulatory Visit: Payer: BC Managed Care – POS

## 2018-08-03 DIAGNOSIS — Z01818 Encounter for other preprocedural examination: Secondary | ICD-10-CM

## 2018-08-03 NOTE — Telephone Encounter
Patient scheduled for surgery on 05/21 in [place].

## 2018-08-03 NOTE — Telephone Encounter
Patient scheduled for surgery at [facility] on 05/21.

## 2018-08-04 ENCOUNTER — Institutional Professional Consult (permissible substitution): Payer: BLUE CROSS/BLUE SHIELD

## 2018-08-04 DIAGNOSIS — Z01818 Encounter for other preprocedural examination: Secondary | ICD-10-CM

## 2018-08-04 DIAGNOSIS — M67442 Ganglion, left hand: Secondary | ICD-10-CM

## 2018-08-05 ENCOUNTER — Ambulatory Visit: Payer: PRIVATE HEALTH INSURANCE

## 2018-08-05 ENCOUNTER — Ambulatory Visit: Payer: BC Managed Care – POS

## 2018-08-05 LAB — COVID-19 PCR

## 2018-08-05 MED ORDER — CLINDAMYCIN HCL 300 MG PO CAPS
300 mg | ORAL_CAPSULE | Freq: Four times a day (QID) | ORAL | 0 refills | 7.00000 days | Status: AC
Start: 2018-08-05 — End: ?

## 2018-08-05 MED ORDER — OXYCODONE HCL 5 MG PO TABS
5 mg | ORAL_TABLET | Freq: Four times a day (QID) | ORAL | 0 refills | Status: AC | PRN
Start: 2018-08-05 — End: ?

## 2018-08-08 NOTE — Patient Instructions
Schedule the surgical procedure(s) as discussed in clinic today.

## 2018-08-11 ENCOUNTER — Telehealth: Payer: BLUE CROSS/BLUE SHIELD | Attending: Hand Surgery

## 2018-08-13 MED ORDER — YUVAFEM 10 MCG VA TABS
ORAL_TABLET | 1 refills | Status: AC
Start: 2018-08-13 — End: ?

## 2018-08-18 ENCOUNTER — Ambulatory Visit: Payer: PRIVATE HEALTH INSURANCE

## 2018-08-28 NOTE — Progress Notes
Patient Consent to Telehealth Questionnaire   [facility] TELEHEALTH PRECHECKIN QUESTIONS 08/11/2018   By clicking ''I Agree'', I consent to the below:  I Agree     - I agree  to be treated via a video visit and acknowledge that I may be liable for any relevant copays or coinsurance depending on my insurance plan.  - I understand that this video visit is offered for my convenience and I am able to cancel and reschedule for an in-person appointment if I desire.  - I also acknowledge that sensitive medical information may be discussed during this video visit appointment and that it is my responsibility to locate myself in a location that ensures privacy to my own level of comfort.  - I also acknowledge that I should not be participating in a video visit in a way that could cause danger to myself or to those around me (such as driving or walking).  If my provider is concerned about my safety, I understand that they have the right to terminate the visit.     TELEMEDICINE NOTE:  DATE OF SERVICE:  05/27/20206    SUBJECTIVE:  Postoperative day #6 status post excision of the left middle finger mucous cysts x 2.  The patient has minimal pain. She is controlling her pain with Tylenol and topical CBD only. No narcotics required.  No fevers or chills.  No numbness or tingling.     PHYSICAL EXAMINATION:  The left middle finger dorsal DIP joint incision is healing appropriately, with sutures intact. No drainage, cellulitis, hematoma, or dehiscence is noted at the incision.  All extensor and flexor tendons are intact.  No mallet deformity.  The radial and ulnar collateral ligaments of the DIP joint are stable on stress testing.  Light touch sensation is normal in the digit.  Only mild edema is present. Mild stiffness is noted, but good range of motion is achieved with encouragement.    DISCUSSION:  The patient is doing well overall status post excision of the mucous cyst. The sutures are chromic gut sutures, which will dissolve by themselves. The patient was asked to continue with aggressive range of motion exercises.  The patient was instructed to contact me within one week if unable to achieve full or nearly full range of motion, or if it appears that supervised hand therapy will be necessary, so that I may order supervised hand therapy in a timely fashion for the patient.  She will continue with use of Coban and to protect the incision. Return to clinic PRN.     Tilda Franco, MD [phone])                Passive ROM:

## 2018-08-28 NOTE — Patient Instructions
More aggressive finger range of motion exercises.  The patient was instructed to contact me within one week if unable to achieve full or nearly full range of motion, or if it appears that supervised hand therapy will be necessary, so that I may order supervised hand therapy in a timely fashion for the patient.

## 2018-09-23 ENCOUNTER — Ambulatory Visit: Payer: MEDICARE

## 2018-10-07 ENCOUNTER — Telehealth: Payer: BLUE CROSS/BLUE SHIELD | Attending: Rheumatology

## 2018-10-07 ENCOUNTER — Ambulatory Visit: Payer: BC Managed Care – POS | Attending: Rheumatology

## 2018-10-07 DIAGNOSIS — M542 Cervicalgia: Secondary | ICD-10-CM

## 2018-10-07 DIAGNOSIS — M7918 Myalgia, other site: Secondary | ICD-10-CM

## 2018-10-07 DIAGNOSIS — M19042 Primary osteoarthritis, left hand: Secondary | ICD-10-CM

## 2018-10-07 DIAGNOSIS — M19041 Primary osteoarthritis, right hand: Secondary | ICD-10-CM

## 2018-10-07 MED ORDER — CYCLOBENZAPRINE HCL 5 MG PO TABS
5 mg | ORAL_TABLET | Freq: Every evening | ORAL | 5 refills | 10.00000 days | Status: AC
Start: 2018-10-07 — End: ?

## 2018-10-07 MED ORDER — TRAUMEEL EX OINT
4 g | Freq: Every day | CUTANEOUS | 3 refills | Status: AC
Start: 2018-10-07 — End: ?

## 2018-10-07 NOTE — Patient Instructions
-   Consider traumeel cream OTC or prescription. I sent prescription to [place], [place]: [phone]    - Consider paraffin bath for hands osteoarthritis     - Continue low dose flexeril 2.5-5 mg QHS for myofascial neck pain.   [email or web address]    - Follow with Dr. Baird Kay recommendations    - Follow with Dr. Greggory Keen recommendations    - Follow with Dr. Kelby Fam for vitamin D supplementation recommendations    - Continue magnesium citrate 400 mg daily and calcium citrate 500 mg in combination with 250 mg daily.    - Continue turmeric, ginger, and fish oil supplementation     - Continue beneficial lifestyle modifications    Follow up in: 6 months PRN. Patient to schedule with our office.

## 2018-10-07 NOTE — Progress Notes
Pt is a 68 y.o. female who presents today for:  Chief Complaint   Patient presents with   ??? Follow-up     FM       Subjective:      Last visit 04/08/2018:    Patient says ''I'm basically very good.'' She has been sleeping much better and credits magnesium 250 mg qd supplementation. She found wearing an eye mask blocks the light at night.     She finds her supplement with fish oil, omega 3 fatty acids, and turmeric have helped her joint aches and pains ''a lot.''     Still experience hypersensitivity to light touch     Water PT didn't provide much relief.    Pilates and restorative yoga has helped her  myofascial pain. Still has residual neck, upper back, and shoulder stiffness     Has noticed she has reached her highest weight.     Body mass index is 22.93 kg/m???.    Interval history 10/07/2018:    TELEMEDICINE APPOINTMENT:    Due to the nature of this encounter, standard vitals and physical examination may not have been conducted unless otherwise noted in their respective portions of this progress note.     Patient Consent to Telehealth Questionnaire   [facility] TELEHEALTH PRECHECKIN QUESTIONS 10/07/2018   By clicking ''I Agree'', I consent to the below:  I Agree     - I agree  to be treated via a video visit and acknowledge that I may be liable for any relevant copays or coinsurance depending on my insurance plan.  - I understand that this video visit is offered for my convenience and I am able to cancel and reschedule for an in-person appointment if I desire.  - I also acknowledge that sensitive medical information may be discussed during this video visit appointment and that it is my responsibility to locate myself in a location that ensures privacy to my own level of comfort.  - I also acknowledge that I should not be participating in a video visit in a way that could cause danger to myself or to those around me (such as driving or walking).  If my provider is concerned about my safety, I understand that they have the right to terminate the visit.     The patient's visit was conducted through a HIPAA compliant telemedicine platform using synchronous audio, video, and access to the patient's medical chart and any prior imaging. The encounter needed to be a telemedicine encounter given the current social distancing recommendations during the COVID-19 pandemic. The option of a telemedicine encounter was offered and the patient gave their consent and elected to initiate the telemedicine visit.     Patient says she has been doing ''really well.''    She has lost 10lbs since last visit by cutting out processed foods, increasing her protein intake, and focusing on fruits and vegetables.    She is s/p finger surgery that she has with Dr. Augusto Garbe in 07/2018 and his is happy with the results.     Patient has great benefits from low dose flexeril 2.5-5 mg QHS for myofascial neck pain.     I reviewed and discussed the following results and/or records with the patient as below:     Lab Results   Component Value Date    CRP <0.3 09/23/2016    CRP <0.5 05/19/2007    SRWEST 6 09/23/2016    SRWEST 11 05/19/2007       Lab Results  Component Value Date    VITD25OH 43 05/10/2018    VITD25OH 35 05/08/2017       ------------------------------------    ROS No Yes   No Yes    No Yes  Fever [x]   []  Vision loss [x]   []  Unexpected bleeding [x]   []       Impression:     - Fibromyalgia   []  controled  []  low activity []  moderate activity []  highly active   - H/o migraine headaches before menopause.    []  controlled  []  low activity []  moderate activity []  highly active   - L finger mucoid cyst, referred to orthopedics   []  controled            []  low activity           []  moderate activity []  highly active         - Osteoarthritis hands, doing home exercises with no significant pain   []  controled            []  low activity           []  moderate activity []  highly active - Neck pain, likely combination of myofascial pain and spondylosis, 09/2016 xray cervical spine shows DDD cervical spine    []  controled            []  low activity           []  moderate activity []  highly active         - Osteoporosis, under the care of Dr. Lorenza Chick   []  controled            []  low activity           []  moderate activity []  highly active         - Insomnia, improvement with OTC magnesium supplementation    []  controled            []  low activity           []  moderate activity []  highly active      Plan:   ???  - Consider traumeel cream OTC or prescription. I sent prescription to [facility] [place], [place] - [address] [place]: [phone]    - Consider paraffin bath for hands osteoarthritis     - Continue low dose flexeril 2.5-5 mg QHS for myofascial neck pain.   [email or web address]    - Follow with Dr. Augusto Garbe recommendations    - Follow with Dr. Josiah Lobo recommendations    - Follow with Dr. Lorenza Chick for vitamin D supplementation recommendations  ??????  - Continue magnesium citrate 400 mg daily and calcium citrate 500 mg in combination with 250 mg daily.  ???  - Continue turmeric, ginger, and fish oil supplementation     - Continue beneficial lifestyle modifications    Follow up in: 6 months PRN. Patient to schedule with our office.     Orders Placed This Encounter   ??? Homeopathic Products (TRAUMEEL) OINT     Sig: Apply 4 g topically daily.     Dispense:  50 g     Refill:  3   ??? cyclobenzaprine 5 mg tablet     Sig: Take 1 tablet (5 mg total) by mouth at bedtime.     Dispense:  30 tablet     Refill:  5     I discussed with patient at length need of regular exercise, weight management and methods of  coping with psychosocial stressors.   I reviewed the plan of care in detail with patient. Patient had the opportunity to ask questions and expressed satisfaction with the plan of care.  The benefits and risks of medications were also explained in detail. []  If box is checked, patient advised to monitor CBC (white blood cell count, hemoglobin, platelet count), liver enzyme levels (AST, ALT), and kidney function (creatinine) every 3 months for possible __ side effects.    []  If box is checked, patient is taking plaquenil (hydroxychloroquine), and advised to schedule retinal exam/eye exam by ophthomology once a year.     []  Total of  __  minutes spent on the visit, with    >50% of time spent directly counseling the patient.     See A/P section for content of discussion     [x]  Total of  _25_  minutes spent on the telemedicine/telephone visit  Procedures:         Past Medical History:   No specialty comments available.    Patient Active Problem List    Diagnosis Date Noted   ??? Mucous cyst of digit of left hand 06/02/2018   ??? Fibromyalgia 05/10/2018     Better sleep with flexeril       ??? Rash, Intermittent, noted after colds 04/29/2018   ??? Asymptomatic varicose veins 03/01/2018   ??? Vitreous floater, bilateral 06/11/2017     No retinal detachment, retinal tear, or vitreous hemorrhage on dilated fundus exam. Signs and symptoms reviewed.     ??? Increased PTH level 05/08/2017     Sees Dr. Selena Batten in endocrinology.      ??? Syncope and collapse 06/25/2016     2018, ECG/Labs/MRI w/o gad unrevealing, saw neurology Dr. Lorina Rabon, plan; EEG, followup      ??? Immune to measles 05/09/2016     (+) Ab 2018     ??? Invasive ductal carcinoma of left breast, ER+PR+HER2- 01/29/2016     2017, Bilateral tomosynthesis screening mammogram on 12/11/2015. There were no suspicious findings seen. She had postsurgical changes in both breasts. Patient then developed bilateral breast pain, left greater than right. She was sent for bilateral diagnostic ultrasounds on 01/15/2016. The right breast was negative. However, in the left breast, there was an irregular mass seen with indistinct margins, measuring 5 x 6 x 4 mm, at 12 o'clock, 6 cm from the nipple. Her left axilla was negative. Genetic testing: NO MUTATION DETECTED/This panel tests three Founder Mutations in the BRCA1/2 genes (BRCA1 187delAG, BRCA1 5385insC, BRCA2 6174delT; lumpectomy: sentinal node negative x 2.  TAM recommended by oncology, annual endometrial surveillance given history by GYN.      ??? Breast pain 01/09/2016     [facility] plan per NP, imaging, lifestyle changes     ??? Cataract, nuclear sclerotic, both eyes 11/29/2015     Not yet visually significant. Continue observation.      ??? Status post LASIK surgery of both eyes 11/29/2015     Performed by Dr. Ellin Goodie (2000 ?). Originally set with left eye for distance and low myopia for right.     ??? Fibroid 04/09/2015     2017 on ultrasound.      ??? BPPV (benign paroxysmal positional vertigo) 07/19/2013   ??? Menopause 04/14/2013   ??? Seborrheic dermatitis 05/11/2012   ??? 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 PPD positive 04/07/2012   ??? Palpitations 04/07/2012     2009 ER PACs, PVCs, sinus arrythmia, ECHO, Stress  ECHO WNL 2009     ??? OA (osteoarthritis) 04/07/2012     Hands 2011, anti CCP neg, no evidence inflammation     ??? Visit for preventive health examination 04/07/2012     Colo noscopy= hyperplastic polyps 2017     ??? Hearing loss, right 04/07/2012     Otosclerosis, s/p stapedectomy     ??? Eye allergies 01/08/2012     Not having significant symptoms at this time, but could use over the counter anti-histamine eye drops, like ketotifen or artificial tears as needed if symptoms recur.     ??? Osteoporosis 11/10/2011     DEXA 2013, spine-2.4, femoral neck -2.9. On HT for menopause sx and as osteoporosis rx.; PA lumbar spine T-score of -2.7, will begin TAM per oncology.          Past Medical History relevant to rheumatology:         Current Medications Relevant to Rheumatology:     Rheum Meds       Disp Refills Start End    cyclobenzaprine 5 mg tablet 30 tablet 5 10/07/2018 10/07/2019    Sig - Route: Take 1 tablet (5 mg total) by mouth at bedtime. - Oral oxyCODONE 5 mg tablet 10 tablet 0 08/05/2018     Sig - Route: Take 1 tablet (5 mg total) by mouth every six (6) hours as needed. Max Daily Amount: 20 mg - Oral    Earliest Fill Date: 08/05/2018    tamoxifen 20 mg tablet 90 tablet 3 06/14/2018     Sig - Route: Take 1 tablet (20 mg total) by mouth daily. - Oral        Rheum Meds Other     Oil Soluble Vitamins       Cholecalciferol (VITAMIN D3) 5000 units CAPS    Take 1 capsule by mouth daily.    Calcium       CALCIUM CITRATE PO    Take 630 mg by mouth daily.           Allergies   Allergen Reactions   ??? Penicillins Other (See Comments) and Rash     Ampicillin, flesh rash in 1980s   ??? Menadiol Sodium Diphosphate Rash   ??? Other      [place] coloring on meds such as advil- not allergic to actual med- just the coloring   ??? Acetaminophen Rash       Physical Exam:   LMP  (LMP Unknown)      Due to the nature of this encounter, no standard physical examination was performed.     The following was observed via telemedicine:  GENERAL: alert, interactive, not in acute distress   HEENT: EOMI, conjunctiva clear  PUL: normal work of breathing. No accessory muscle use, no notable tachypnea. Speaking full sentences without taking breaks   NEURO: moving upper extremity without issues   PSYCH: affect appropriate    - Hands no swelling, normal trophicity, normal movements of the hands and fingers joints  - R finger DIP inflamed  - No color changes suggestive of raynaud's   - No trophic changes, sclerodactyly, or skin discoloration        Recent Labs and Test Results:     Lab Results   Component Value Date    CRP <0.3 09/23/2016    CRP <0.5 05/19/2007    SRWEST 6 09/23/2016    SRWEST 11 05/19/2007     Lab Results   Component Value Date  WBC 3.76 (L) 09/23/2016    HGB 13.1 09/23/2016    HCT 37.3 09/23/2016    PLT 202 09/23/2016    NEUTABS 2.08 09/23/2016    LYMPHABS 1.12 (L) 09/23/2016     Lab Results   Component Value Date    GLUCOSE 98 05/10/2018    CREAT 0.72 05/10/2018 CALCIUM 9.2 05/10/2018    CALCIUM 9.2 05/10/2018    TOTPRO 6.7 05/10/2018    ALBUMIN 4.6 05/10/2018    AST 19 05/10/2018    ALT 15 05/10/2018    ALKPHOS 56 05/10/2018     No results found for: C3, C4, DSDNAAB, NDNAABIFA  Lab Results   Component Value Date    PROTCLUR Negative 02/14/2016    BLDUR 1+ (A) 02/14/2016    LEUKESTUR Negative 02/14/2016    RBCSUR 11 02/14/2016    WBCSUR 2 02/14/2016     Lab Results   Component Value Date    VITD25OH 43 05/10/2018    VITD25OH 35 05/08/2017     Lab Results   Component Value Date    CKTOT 64 06/02/2016       Lab Visit on 09/15/2018   Component Date Value   ??? COVID-19 IgG 09/15/2018 Negative          Attestation:  Scribe Signature:  I, Nicolette Morris, have scribed for Dr. Linus Mako with the documentation for Rejeana Brock on 10/07/2018 at 11:53 AM.    Physician Signature:  Dr. Linus Mako 10/07/2018 11:53 AM     I have reviewed this note, scribed by Mariane Baumgarten and attest that it is an accurate representation of my H & P and other events of the outpatient visit except if otherwise noted.

## 2018-10-13 MED ORDER — ACYCLOVIR 400 MG PO TABS
400 mg | ORAL_TABLET | Freq: Two times a day (BID) | ORAL | 1 refills | 30.00000 days
Start: 2018-10-13 — End: ?

## 2018-10-14 MED ORDER — ACYCLOVIR 400 MG PO TABS
400 mg | ORAL_TABLET | Freq: Two times a day (BID) | ORAL | 1 refills | 30.00000 days | Status: AC
Start: 2018-10-14 — End: ?

## 2018-11-15 ENCOUNTER — Ambulatory Visit: Payer: BC Managed Care – POS

## 2018-11-15 DIAGNOSIS — I781 Nevus, non-neoplastic: Secondary | ICD-10-CM

## 2018-11-15 DIAGNOSIS — D229 Melanocytic nevi, unspecified: Secondary | ICD-10-CM

## 2018-11-15 DIAGNOSIS — L720 Epidermal cyst: Secondary | ICD-10-CM

## 2018-11-15 DIAGNOSIS — L814 Other melanin hyperpigmentation: Secondary | ICD-10-CM

## 2018-11-15 DIAGNOSIS — L821 Other seborrheic keratosis: Secondary | ICD-10-CM

## 2018-11-15 NOTE — Progress Notes
Assessment/Plan:     1. Seborrheic keratoses  - Benign   - Reassurance provided     2. Cherry angioma  - Benign   - Reassurance provided     3. Milia  - Benign   - Reassurance provided   - Will consider cosmetic consultation if desires removal, aware of out of pocket cost     4. Multiple benign nevi  - None concerning at this time  - Reassured   - ABCDE of moles/melanoma reviewed in detail with the patient. Monthly self examination was encouraged with the instructions to follow up should any lesions show evidence of growth, color change or become symptomatic. Daily sun precautions were reviewed including use of moisturizer with sunscreen daily, sun avoidance, and protective clothing.     5. Solar lentiginosis  - Emphasized photoprotection        Return in about 1 year (around 11/15/2019).  Advised to call sooner for new/changing lesions.     _____________________________________________________________________    HPI: 68 y.o. female here for full body skin check. Has following concerns:     1. bump    Location: back     Duration: noticed by husband few weeks ago    Associated symptoms: no    Prior treatment: no       2. Bumps     Location: face      Duration: years     Associated symptoms: appearance     Prior treatment: no       Patient uses sunscreen most of the times when out in the sun.      Significant Dermatology History:    No  Family History of melanoma: No  Blistering sunburns: Yes    ROS:  Constitutional: Negative  HEENT: Negative  Musculoskelatal: Not assessed   Skin otherwise Negative  Conjunctiva and Lids: Negative    Past Medical History: reviewed  Social history: reviewed  Medications: reviewed  Allergies: reviewed     PHYSICAL EXAM  General:   Alert, appears stated age and cooperative      HEENT:  Head normocephalic, atraumatic. Extraocular movements intact.      Extremities:   No clubbing, cyanosis, or edema   Psychiatric:   Oriented to time, place and person, mood and affect are within normal limits     The following areas of the skin were examined and exam is normal unless otherwise noted:   Scalp/hair, face/head, neck, lips/teeth/gums, lids/conjunctivae, chest/breast/axillae, abdomen, back, buttocks/genitals, RUE, LUE, RLE, LLE, digits/nails.    Fitzpatrick type II  Dermatoheliosis: moderate-significant   Multiple light to dark Igou macules distributed over face, neck and upper arms in the photo-distributed areas, consistent with solar lentigines  Mulitple cherry red papules  Multiple Soltau-tan stuck on waxy papules, including spot of concern on back  Scattered light to dark Misner macules and thin papules with benign features on dermoscopy  Few white firm papules on the face, including spots of concern       Gareth Eagle, MD   11/15/2018  5:05 PM

## 2018-11-28 NOTE — Progress Notes
BREAST FOLLOW-UP OFFICE VISIT NOTE    ?PATIENT: Robin Spencer  ?MRN: [id number]  ?DOB: Mar 03, 1951    ?DATE OF SERVICE: 11/30/2018    ?REFERRING PRACTITIONER: Cyril Loosen., MD  ?PRIMARY CARE PROVIDER: Pregler, Weyman Croon., MD  ?RESIDENT PHYSICIAN: Kennedy Bucker, MD  ?ATTENDING PHYSICIAN: Kennedy Bucker, MD     ?CHIEF COMPLAINT: 2.5 yr f/u since completion of RT    ?IDENTIFYING DATA: Robin Spencer is a 68 y.o. female with a pT2N0(i-), Grade 1, ER+, PR-, Her2- IDC of the left breast s/p BCS+SNB on 02/29/16. She completed RT 04/24/16, on tamoxifen.    ?RADIATION HISTORY: She received 42.56 Gy in 16 fractions to the breast followed by a boost of 10 Gy in 4 fractions to the lumpectomy cavity. Radiation was completed on 04/24/2016.     ?DATA OF INTERVAL HISTORY:  ? Breast-FU Data Fields ~ Data ~ Comments    ? Most recent imaging date (m/d/yyyy) ~ 11/30/18 bilat mm and [place]  12/30/17 breast MRI   12/21/17 left [place]   11/24/17 b/l mammo [place] ~     ? Local Failure (LF) ~  No Local Failure ~     ? If LF, date (m/d/yyyy) ~  ~     ? Regional failure (RF) ~  No Regional Failure ~     ? If RF, date (m/d/yyyy) ~  ~     ? Distant Mets (DM) ~  No Distant Metastases ~     ? If DM, date (m/d/yyyy) ~  ~     ? Any failure within RT field? ~  No ~     ? RT related complications ~  No ~     ? Complications ~  ~     ? Highest complication grade  (CTCAE) ~  ---- ~  ?     Subjective:       Interval History:   [name] returns for routine posttreatment follow up since completing radiation approximately 2.5 yrs ago.    Today, she is feeling well. Regarding the treated left breast, she reports that the NAC appears paler, which has been stable. The treated breast remains slightly fuller compared to the right breast No breast pain. No skin textural change.  She is happy w cosmesis.  She denies cough, shortness of breath, swelling in the arm, headaches, bone pain. On tamoxifen and following with Dr Maye Hides, last seen 02/25/17. She is tolerating it well with some hot flashes.    Screening b/l mammo, tomo + b/l [place] 11/24/17  MAMMOGRAM FINDINGS:     The breast tissue is extremely dense, which lowers the sensitivity of mammography (density D).     Finding 1:  There are stable post-lumpectomy changes seen in the left breast.     In the right breast, no suspicious masses, calcifications or other abnormalities are seen.     No significant interval change is identified.     ULTRASOUND FINDINGS:     Finding 1:  Whole breast and axillary sonography was performed in both breasts.     Ultrasound demonstrates a post lumpectomy scar seen in the left breast at 12:00.  However the scar  appears more prominent and further evaluation is warranted at this time.     Finding 2:  There is a post surgical scar seen in the right breast.     No axillary lymphadenopathy.     IMPRESSION:     ?  Finding 1:  Possibly more prominent lumpectomy scar at 12:00. A breast ultrasound exam is  recommended.     Finding 2:  There is no mammographic evidence of malignancy.     BI-RADS Category 0:  Incomplete: Needs Additional Imaging Evaluation      Left breast U/S, dx 12/21/17  ULTRASOUND FINDINGS:     The left breast 12:00 surgical scar is associated with architectural distortion and two hypoechoic  areas with surrounding echogenic tissue and surgical clips spanning 4 cm.  No associated  vascularity. The findings are likely related to a prominent post surgical scar, however prior  ultrasound images show a more typical, smaller scar.  MRI of the breasts is recommended for further  evaluation.     No axillary lymphadenopathy.     IMPRESSION:     ?  Post surgical scar in the left breast at 12 o'clock, upper outer quadrant requires additional  evaluation. A breast MRI is recommended. The patient was informed of the exam results by Dr. Leonard Schwartz  before leaving the facility.     MRI breast 12/30/17  FINDINGS:   ?  GENERAL: -Breast density: Extreme fibroglandular tissue  -Background parenchymal enhancement: Minimal  ?  RIGHT BREAST:  -Small enhancing oval mass with circumscribed margins in the right lateral breast (post-contrast series, image 121) is stable in comparison to 02/05/2016 and benign in appearance  -No suspicious masses, lymph nodes, or areas of abnormal enhancement  ?  LEFT BREAST:  -Postsurgical changes at 12:00 without abnormal enhancement  -No suspicious masses, lymph nodes, or areas of abnormal enhancement  ?  ADDITIONAL:  -None.  ?  ?  IMPRESSION:   ?  RIGHT BREAST: CATEGORY 1 - NEGATIVE.    ?  LEFT BREAST: No abnormal enhancement in the region of the post-surgical scar. CATEGORY 2 - BENIGN FINDINGS.  ?  OVERALL ASSESSMENT: CATEGORY 2 - BENIGN FINDINGS. Recommend routine annual mammography.       Bilat mm and US done earlier today 11/30/18, results pending         PERTINENT PAST MEDICAL HISTORY:   Past Medical History:   Diagnosis Date   ? Anxiety    ? Breast cancer (HCC/RAF)    ? Eczema    ? Fibroids    ? GERD (gastroesophageal reflux disease)     esophagitis   ? History of migraine headaches    ? History of radiation therapy 04/2016    L breast   ? Otosclerosis    ? Post-operative nausea and vomiting    ? Rectal fissure    ? Vitreous detachment      Past Surgical History:   Procedure Laterality Date   ? BREAST BIOPSY  1986    Fibroadenoma, ductal hyperplasia   ? Laparoscopy for subfertility     ? Left finger cyst removal x 2     ? Stapedectomy, right         GYN HISTORY:   OB History   Obstetric Comments   Menses started: 28   Gravida: 0   Para:  0   Age of first live birth: n/a    Breast feed: n/a    Contraceptives/Type:  Y, 18-30    Menopause reached at: 49   Hormone replacement/Type: Y, 62-64    Fertility Tx: Y        ALLERGIES:   Allergies as of 11/30/2018 - Review Complete 11/15/2018   Allergen Reaction Noted   ? Penicillins Other (See Comments) and Rash 01/08/2012 ? Menadiol  sodium diphosphate Rash 03/19/2016   ? Other  02/29/2016   ? Acetaminophen Rash 03/02/2016     MEDICATIONS:   Current Outpatient Medications   Medication Sig   ? ACYCLOVIR 400 mg tablet TAKE 1 TABLET (400 MG TOTAL) BY MOUTH TWO (2) TIMES DAILY.   ? BIOTIN PO Take 1 tablet by mouth daily .     ? CALCIUM CITRATE PO Take 630 mg by mouth daily.   ? Cholecalciferol (VITAMIN D3) 5000 units CAPS Take 1 capsule by mouth daily.   ? cyanocobalamin 500 mcg tablet Take 500 mcg by mouth daily.   ? cyclobenzaprine 5 mg tablet Take 1 tablet (5 mg total) by mouth at bedtime.   ? Homeopathic Products (TRAUMEEL) OINT Apply 4 g topically daily.   ? MAGNESIUM CITRATE PO Take 150 mg by mouth two (2) times daily.   ? Omega-3 Fatty Acids (FISH OIL PO) Take 1,400 mg by mouth two (2) times daily.   ? oxyCODONE 5 mg tablet Take 1 tablet (5 mg total) by mouth every six (6) hours as needed. Max Daily Amount: 20 mg   ? tamoxifen 20 mg tablet Take 1 tablet (20 mg total) by mouth daily.   ? triamcinolone 0.5% cream Apply topically as needed for.   ? UNABLE TO FIND Med Name: [place] supplement- Epimedium, Tumeric, Japanese, Fleeceflower, EPA/DHA .   ? YUVAFEM 10 MCG vaginal tablet PLACE 1 TABLET (10 MCG TOTAL) VAGINALLY TWO (2) TIMES A WEEK.     No current facility-administered medications for this visit.        SOCIAL HX:   Social History     Socioeconomic History   ? Marital status: Married     Spouse name: Not on file   ? Number of children: Not on file   ? Years of education: Not on file   ? Highest education level: Not on file   Occupational History   ? Not on file   Social Needs   ? Financial resource strain: Not on file   ? Food insecurity:     Worry: Not on file     Inability: Not on file   ? Transportation needs:     Medical: Not on file     Non-medical: Not on file   Tobacco Use   ? Smoking status: Never Smoker   ? Smokeless tobacco: Never Used   Substance and Sexual Activity   ? Alcohol use: No     Alcohol/week: 0.0 oz ? Drug use: No   ? Sexual activity: Yes     Partners: Male     Birth control/protection: None   Lifestyle   ? Physical activity:     Days per week: Not on file     Minutes per session: Not on file   ? Stress: Not on file   Relationships   ? Social connections:     Talks on phone: Not on file     Gets together: Not on file     Attends religious service: Not on file     Active member of club or organization: Not on file     Attends meetings of clubs or organizations: Not on file     Relationship status: Not on file   Other Topics Concern   ? Not on file   Social History Narrative    Patient is married, and has two children. She is of Ashkenazi Agricultural engineer.        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 Hypertension Father    ? Stroke Father    ? Hyperlipidemia Father    ? Depression Father    ? Prostate cancer Father 77   ? Hypertension Mother    ? Asthma Mother    ? Endometrial cancer Mother 44   ? Hyperthyroidism Mother    ? Prostate cancer Brother 75   ? Breast cancer Neg Hx    ? Ovarian cancer Neg Hx    ? Malignant hypertension Neg Hx       Objective:      Physical Exam:  There were no vitals taken for this visit.   GENERAL: The patient is a well-developed, well-nourished, female in no acute distress.  HEENT: Normocephalic and atraumatic. Anicteric sclerae. Mucous membranes are moist. Hearing is intact bilaterally.   NECK: supple, nl ROM  BREASTS: Visualization reveals faint residual hyperpigmentation on left and left NAC is mildly hypopigmented vs right.  Left breast slightly larger than right. Palpation reveals no discrete nodulrity but bilat breasts are very dense centrally.  No appreciable fibrosis.  LYMPHATICS: No sclav/iclav/axillary LAD palpated bilaterally  CHEST: Non labored breathing. MUSCULOSKELETAL SYSTEM: No upper extremity lymphedema. Shoulder abduction and external rotation fully intact.   NEUROLOGIC EXAM: Patient is alert and oriented. Patient ambulates with a normal gait.    Lab Review / Pathology / Radiology:     See HPI     Assessment:      Robin Spencer is a 68 y.o. female with a pT2N0(i-), Grade 1, ER+, PR-, Her2- IDC of the left breast s/p BCS+SNB on 02/29/16 followed by radiation, on tamoxifen. NED     Plan/ Recommendation:      - On tamoxifen, advised she schedule a f/u with Dr. Maye Hides  - Bilateral mammogram and ultrasound done today 11/30/18, results pending; supplemental U/S due to extremely dense breast tissue and mammo occult cancer  - I explained that I would be happy to follow up with her at any time in the future in person, by phone, or by email should she have any questions or concerns. She is likely moving to [place] but tentatively would like to return in 1 yr w same day imaging and f.u.  Will contact her w today's results and finalize a f/u plan accordingly.    Thank you kindly for allowing me to participate in the care of this lovely patient.       cc Pregler, Weyman Croon., MD  Cyril Loosen., MD    Author: Kennedy Bucker 11/28/2018 9:27 AM

## 2018-11-30 ENCOUNTER — Inpatient Hospital Stay: Payer: PRIVATE HEALTH INSURANCE | Attending: Radiation Oncology

## 2018-11-30 ENCOUNTER — Inpatient Hospital Stay: Payer: BC Managed Care – POS | Attending: Radiation Oncology

## 2018-11-30 DIAGNOSIS — Z170 Estrogen receptor positive status [ER+]: Secondary | ICD-10-CM

## 2018-11-30 DIAGNOSIS — C50812 Malignant neoplasm of overlapping sites of left female breast: Secondary | ICD-10-CM

## 2018-11-30 NOTE — Patient Instructions
Thank you for visiting [place] today in [place].     Please check out at the front desk and schedule your follow-up appointment with Dr. Sheppard Coil in 12 months. It is very important to follow up as instructed because it allows [place] to provide you the best care possible.      You will be due for follow up mammogram & breasts ultrasound on or after 11/30/2019 if your imaging today is normal.  Please call the Women's Imaging Department at [phone] to schedule the mammogram at your convenience. The [facility] Department will obtain insurance authorization once you have scheduled the appointment.      If you have any questions or concerns between now and your next visit, please feel free to contact us at [phone].     We look forward to seeing you next time.

## 2018-12-01 ENCOUNTER — Ambulatory Visit: Payer: BLUE CROSS/BLUE SHIELD

## 2018-12-01 DIAGNOSIS — Z170 Estrogen receptor positive status [ER+]: Secondary | ICD-10-CM

## 2018-12-01 DIAGNOSIS — C50812 Malignant neoplasm of overlapping sites of left female breast: Secondary | ICD-10-CM

## 2018-12-09 ENCOUNTER — Ambulatory Visit: Payer: BC Managed Care – POS

## 2018-12-10 ENCOUNTER — Ambulatory Visit: Payer: BC Managed Care – POS

## 2018-12-10 ENCOUNTER — Telehealth: Payer: BC Managed Care – POS

## 2018-12-10 MED ORDER — OXYBUTYNIN CHLORIDE 5 MG PO TABS
5 mg | ORAL_TABLET | Freq: Two times a day (BID) | ORAL | 11 refills | Status: AC
Start: 2018-12-10 — End: ?

## 2018-12-10 NOTE — Progress Notes
Patient Consent to Telehealth Questionnaire   [facility] TELEHEALTH PRECHECKIN QUESTIONS 12/10/2018   By clicking ''I Agree'', I consent to the below:  I Agree     - I agree  to be treated via a video visit and acknowledge that I may be liable for any relevant copays or coinsurance depending on my insurance plan.  - I understand that this video visit is offered for my convenience and I am able to cancel and reschedule for an in-person appointment if I desire.  - I also acknowledge that sensitive medical information may be discussed during this video visit appointment and that it is my responsibility to locate myself in a location that ensures privacy to my own level of comfort.  - I also acknowledge that I should not be participating in a video visit in a way that could cause danger to myself or to those around me (such as driving or walking).  If my provider is concerned about my safety, I understand that they have the right to terminate the visit.     BREAST MEDICAL ONCOLOGY PROGRESS NOTE     Patient name: Robin Spencer  PCP: Pregler, Weyman Croon., MD  Referring provider: Cristopher Peru MD, P*      MRN: [id number]  DOB: [birth year]  Age: 68 y.o.      Reason for referral: breast cancer  Chief Complaint   Patient presents with   ? Breast Cancer     History was obtained from the patient and medical records.  History of the Present Illness   Robin Spencer is a 68 y.o. post-menopausal female with ER+PR+HER2- (IHC) invasive ductal carcinoma of the left breast, s/p lumpectomy with sentinel lymph node excision 02/29/16, s/p adjuvant XRT 03/27/16-04/25/16, now on tamoxifen.     Robin Spencer presented with acute RIGHT breast pain. On 01/15/16 bilateral diagnostic breast ultrasound, she was found to have an irregular mass measuring 5x6x10mm at 12 o'clock, 6cm from the nipple in the LEFT breast. There was no axillary lymphadenopathy.    Robin Spencer underwent biopsy of the left breast mass on 01/22/16. On pathology, she was found to have an invasive ductal carcinoma, grade 1 with mBRS 5/9, in a background of DCIS (micropapillary and cribriform, no necrosis, low nuclear grade). ER+ (95% 3+), PR+ (40% 1-2+), HER2- (IHC 1+, FISH with HER2/CEN17 ratio 1.0), Ki67 1-2%.     Progress / Interval Events   02/29/16 left lumpectomy with sentinel lymph node biopsy. Invasive ductal carcinoma, grade 1 with mBRS 5/9, 2.8cm. ER+ (90%, 2-3+), PR+ (0%), HER2- (IHC 1+, HER2/CEP17 1.0 with 2.0 copies of HER2 per cell), Ki67 <1%. 2.1cm DCIS (clinging and micropapillary type with focal central necrosis. 0/2 lymph nodes involved. No LVI. pT2N0(i-).  03/27/16-04/18/16 Radiation therapy.   04/16/16 Presents to discuss adjuvant hormonal therapy. Started tamoxifen because continues on hormonal therapy.   08/13/16 Follow up on tamoxifen x3 months. Generally doing well except for hot flashes every 50 minutes. Vaginal atrophy much improved on DHEA. Some residual pain in the left breast.   12/11/16 mammogram + U/S, screening: no evidence of malignancy.  02/25/17 Tolerating tamoxifen well. Worried about her son, who is currently hospitalized for diarrheal illness.  11/30/18 screening tomo and ultrasound. Benign.  12/10/18 diagnosed with fibromyalgia based on painful trigger points, but she doubts this diagnosis.    Review of Systems   Constitutional: No fevers or chills. No weight loss or appetite changes.   Eyes: No recent blurry vision, double vision or visual  changes.  ENT: No recent sinus pain or congestion. No sore throat or mouth sores.   Neck: No neck pain or stiffness.  Breasts: healed well. Breast pain, less than prior but persistent.  Cardiovascular: No chest pain or pressure, no palpitations. No swelling.  Pulmonary: No shortness of breath, no cough or wheezing.   Gastrointestinal: No abdominal pain. No nausea/vomiting. No diarrhea or constipation. No melena or BRBPR.   Genitourinary: No urinary frequency, urgency. + dysuria. + pain with sexual intercourse.  Musculoskeletal: No joint pain, muscle pain or back pain.   Neurologic: No headaches. No numbness, tingling or weakness.   Dermatologic: No rash, pruritus or recent skin changes.   Hematologic: No abnormal bruising or bleeding.   Endocrine: No polyuria, polydipsia, heat or cold intolerance. + worsening hot flashes and difficulty sleeping.  Psychiatric: No depressive symptoms. No suicidal or homicidal ideation.     Medical History Surgical History   Past Medical History:   Diagnosis Date   ? Anxiety    ? Breast cancer (HCC/RAF)    ? Eczema    ? Fibroids    ? GERD (gastroesophageal reflux disease)     esophagitis   ? History of migraine headaches    ? History of radiation therapy 04/2016    L breast   ? Otosclerosis    ? Post-operative nausea and vomiting    ? Rectal fissure    ? Vitreous detachment     Past Surgical History:   Procedure Laterality Date   ? BREAST BIOPSY  1986    Fibroadenoma, ductal hyperplasia   ? Laparoscopy for subfertility     ? Left finger cyst removal x 2     ? Stapedectomy, right          Family History Social History   The family history includes Asthma in her mother; Depression in her father; Endometrial cancer (age of onset: 15) in her mother; Hyperlipidemia in her father; Hypertension in her father and mother; Hyperthyroidism in her mother; Prostate cancer (age of onset: 80) in her brother; Prostate cancer (age of onset: 74) in her father; Stroke in her father. The patient  reports that she has never smoked. She has never used smokeless tobacco. She reports that she does not drink alcohol or use drugs.   Ob/Gyn History    OB History   Obstetric Comments   Menses started: 59   Gravida: 0   Para:  0   Age of first live birth: n/a    Breast feed: n/a    Contraceptives/Type:  Y, 18-30    Menopause reached at: 51   Hormone replacement/Type: Y, 50-64    Fertility Tx: Y    Occupational History   ? Not on file     Social History     Social History Narrative Patient is married, and has two children. She is of Ashkenazi Agricultural engineer.        Work: Retired from [employer] relations        Exercise:        Diet:        Transfusion:        Religion:    Son on the autism spectrum, has bulimia.      Medications     Allergies   Allergen Reactions   ? Penicillins Other (See Comments) and Rash     Ampicillin, flesh rash in 1980s   ? Menadiol Sodium Diphosphate Rash   ? Other  [place] coloring on meds such as advil- not allergic to actual med- just the coloring   ? Acetaminophen      Denies this allergy     Current Outpatient Medications   Medication Sig   ? ACYCLOVIR 400 mg tablet TAKE 1 TABLET (400 MG TOTAL) BY MOUTH TWO (2) TIMES DAILY.   ? BIOTIN PO Take 1 tablet by mouth daily .     ? CALCIUM CITRATE PO Take 630 mg by mouth daily.   ? Cholecalciferol (VITAMIN D3) 5000 units CAPS Take 1 capsule by mouth daily.   ? cyanocobalamin 500 mcg tablet Take 500 mcg by mouth daily.   ? cyclobenzaprine 5 mg tablet Take 1 tablet (5 mg total) by mouth at bedtime.   ? Homeopathic Products (TRAUMEEL) OINT Apply 4 g topically daily.   ? MAGNESIUM CITRATE PO Take 150 mg by mouth two (2) times daily.   ? Omega-3 Fatty Acids (FISH OIL PO) Take 1,400 mg by mouth two (2) times daily.   ? oxybutynin 5 mg tablet Take 1 tablet (5 mg total) by mouth two (2) times daily.   ? oxyCODONE 5 mg tablet Take 1 tablet (5 mg total) by mouth every six (6) hours as needed. Max Daily Amount: 20 mg (Patient not taking: Reported on 11/30/2018.)   ? tamoxifen 20 mg tablet Take 1 tablet (20 mg total) by mouth daily.   ? triamcinolone 0.5% cream Apply topically as needed for.   ? UNABLE TO FIND Med Name: [place] supplement- Epimedium, Tumeric, Japanese, Fleeceflower, EPA/DHA .   ? YUVAFEM 10 MCG vaginal tablet PLACE 1 TABLET (10 MCG TOTAL) VAGINALLY TWO (2) TIMES A WEEK.     No current facility-administered medications for this visit.      Objective   LMP  (LMP Unknown) General: Appears well-developed, well-nourished and close to stated age.   Head: Normocephalic, atraumatic.  Eyes: PERRL without icterus.   ENT: Oropharynx is clear, mucus membranes are moist. No oral ulcers noted. Good dentition.  Neck: Supple. Trachea midline.   CV: No edema.   Chest: Respiratory effort appears normal.   Breasts: (prior: breasts appear normal, no suspicious masses, no skin or nipple changes or axillary nodes. S/p left mastectomy, well-healed.)  Musculoskeletal: No cyanosis. Extremities are warm and well-perfused.   Neurologic: Station appears normal.   Hematologic: No bruising, purpura or petechiae are noted.   Dermatologic: No rashes appreciated.   Psychiatric: Affect appropriate. Pleasant and conversant.   ECOG performance status: 0-Fully active, able to carry on all pre-disease performance without restriction  Laboratory   I have personally reviewed lab results. Mild leukopenia.   Lab Results   Component Value Date    WBC 3.76 (L) 09/23/2016    HGB 13.1 09/23/2016    HCT 37.3 09/23/2016    MCV 99.7 (H) 09/23/2016    PLT 202 09/23/2016    CREAT 0.72 05/10/2018    BUN 19 05/10/2018    NA 143 05/10/2018    K 4.5 05/10/2018    CL 102 05/10/2018    CO2 32 (H) 05/10/2018    ALT 15 05/10/2018    AST 19 05/10/2018    ALKPHOS 56 05/10/2018    BILITOT 0.2 05/10/2018    ALBUMIN 4.6 05/10/2018       Pathology   I have personally reviewed all available pathology reports   03/04/16 left breast lumpectomy  FINAL DIAGNOSIS   Date Value Ref Range Status   02/29/2016   Final  A.  BREAST, LEFT (WIRE LOCALIZED LUMPECTOMY):  - Invasive ductal carcinoma, grade 1, 2.8 cm in greatest diameter (4/9 consecutive levels by slice method)   Modified Bloom Richardson Score: 5 of 9      Tubule formation: 2      Nuclear pleomorphism: 2      Mitotic score: 1  - In situ carcinoma (DCIS): present, 2.1 cm in greatest diameter (3/9 consecutive levels by slice method), clinging and micropapillary type with focal central necrosis  - Lumpectomy surgical margins: negative for invasive or in situ carcinoma (see B-G for final margin status)   Invasive ductal carcinoma:     Lateral margin: 7 mm    Inferior margin: 7 mm   Ductal Carcinoma in situ:     Lateral margin: 4 mm    Inferior margin: 2 mm  - Lymph/vascular invasion: not identified  - Microcalcifications: not identified  - Pathologic staging: p2 (sn)N0(i-) See synoptic report for case details  - Breast biomarkers: See Immunohistochemistry report below                                                                                                                                                                                                                                   B.  LEFT BREAST, ANTERIOR MARGIN (EXCISION):  - Fibroglandular breast tissue  - Negative for invasive or in situ malignancy    C.  LEFT BREAST, INFERIOR MARGIN (EXCISION):  - Fibroglandular breast tissue  - Negative for invasive or in situ malignancy    D.  LEFT BREAST, LATERAL MARGIN (EXCISION):  - Fibroglandular breast tissue  - Negative for invasive or in situ malignancy    E.  LEFT BREAST, SUPERIOR MARGIN (EXCISION):  - Fibroglandular and fatty breast tissue  - Negative for invasive or in situ malignancy    F.  LEFT BREAST, MEDIAL MARGIN (EXCISION):  - Fibroglandular breast tissue  - Negative for invasive or in situ malignancy    G.  LEFT BREAST, DEEP MARGIN (EXCISION):  - Fibroadipose tissue  - Negative for invasive or in situ malignancy    H.  SENTINEL LYMPH NODES, LEFT AXILLA (LYMPHADENECTOMY):  - Two lymph nodes, negative for malignancy (0/2)        IHC REPORT   Date Value Ref Range Status   02/29/2016   Final    Breast Biomarkers: Block A6    RESULT ER/PR   ESTROGEN RECEPTORS  PROGESTERONE RECEPTORS   Antibody Clone SP1 Clone 636   %Tumor Staining 90% 0%   Intensity (1+ to 3+) 2-3+ NA   Leica Bond III with Refine Polymer Detection System, using Heat retrieval for 20 minutes with pH6 buffer. Clone SP1 Diluted to 1/50 and PR636 to 1/200.    ESTROGEN/PROGESTERONE IMMUNOHISTOCHEMICAL REPORT  Using appropriate positive and negative controls, the test for the presence of these hormone receptor proteins is performed by the immunoperoxidase method, and reported according to the 2009 CAP-ASCO Guidelines for Hormone Receptor testing. Tissue is fixed from 6-72 hours in 10% neutral buffered formalin. A positive ER or PR tumor shows greater than or equal to 1 percent of cells staining, and results are semi-quantitated as indicated above.      RESULT: HER-2/neu IHC assay (utilizing FDA-approved DAKO Hercep Test): J. Clin. Oncol 2013; 1-18, and Arch Pathol Lab Med 0981:1-91.     Test Score:   1+    HER2/neu:  No overexpression    0 no staining observed or membrane staining that is incomplete and is faint/barely perceptible and within ? 10% of tumor cells No overexpression   1+ Incomplete membrane staining that is faint/barely perceptible and within > 10% of tumor cells No overexpression   2+ circumferential membrane staining that is incomplete, and/or weak/moderate within > 10% of tumor cells or complete and circumferential membrane staining that is intense and within ? 10% of tumor cells   Equivocal   3+ circumferential membrane staining that is complete, intense, and within > 10% of tumor cells Overexpression     Note: FISH gene amplification testing. Please see separate addendum report.    Ki-67: <1%    Note: The immunoperoxidase stains reported above for ER, PR, and Ki-67 were developed and their performance characteristics determined by Department of Pathology & Laboratory Medicine, [place] [facility] [place], [place]. They have not been cleared or approved by the U.S. Food and Drug Administration, although such approval is not required for analyte-specific reagents of this type.    GENERAL IMMUNOHISTOCHEMISTRY REPORT    BLOCK:  H1/H2  FIXATIVE:  Formalin ANTIBODY/PROBE: RESULT/COMMENT  CK7   Negative  PanKer   Negative     INTERPRETATION:  Negative for isolated tumor cells    Note: The immunoperoxidase stain reported above was developed and its performance characteristics determined by [facility].  It has not been cleared or approved by the U.S. Food and Drug Administration, although such approval is not required for analyte-specific reagents of this type.  Appropriate positive and negative controls are included for each case.         FISH REPORT   Date Value Ref Range Status   02/29/2016   Final    FINAL DIAGNOSIS: ERBB2 (HER2) FISH:  Negative for HER2 gene amplification     HER2/CEP17 ratio: 1.0   Average HER2 copy number per cell: 2.0     KARYOTYPE:    nuc ish(D17Z1,ERBB2)x2[20]      INTERPRETATION:  FISH analysis with the dual color probes specific for the centromere of chromosome 17 and the ERBB2 (HER2) gene (17q11.2) (multiplex1) was performed and examined by two independent technologists.  These studies showed no evidence of HER2 amplification in the 20 nuclei analyzed, per the FDA-approved criteria (package insert).    Average HER2 copy number: 2.0 signals/cell    D17Z1: Green signals 40  HER-2: Red signals  40  Ratio:  R/G   1.0      AMPLIFIED:  Dual-probe  HER2/CEP17 ratio ? 2.0 with an average HER2 copy number ? 4.0 signals per cell   Dual-probe HER2/CEP17 ratio ? 2.0 with an average HER2 copy number < 4.0 signals/cell  Dual-probe HER2/CEP17 ratio < 2.0 with an average HER2 copy number ? 6.0 signals/cell    NOT AMPLIFIED: Dual-probe HER2/CEP17 ratio < 2.0 with an average HER2 copy number < 4.0 signals/cell    EQUIVOCAL: Dual-probe HER2/CEP17 ratio < 2.0 with an average HER2 copy number ? 4.0 and < 6.0 signals/cell    INDETERMINATE: NO FISH results due to technical issues (Inadequate specimen handling; Artifacts; Analytic testing failure); Recommended to send another specimen for testing to determine HER2 status. References: Veronia Beets al, ASCO/CAP HER2 Testing Guideline Update ? 2013 College of American Pathologists    Note: The PathVysion HER2 DNA Probe Kit (PathVysion Kit) which is FDA approved is designed to detect amplification of the HER-2/neu gene via fluorescence in situ hybridization (FISH) in formalin-fixed, paraffin-embedded human breast cancer tissue specimens. The nuclei counted in these studies were within the region selected by a pathologist.  Specimens were fixed in 10% neutral buffered formalin for 6-72 hours. The FISH test was developed and its performance characteristics determined by [facility].       01/22/16 core biopsy [[place]]  BREAST, LEFT, 12:00?(NEEDLE CORE BIOPSY):  - Invasive ductal carcinoma, Grade 1?(50% of biopsy, longest involved segment spans 0.5 cm)  ???????Modified Bloom?and?Richardson Score: 5 of 9  ?????????????Tubule formation:????????????????2  ?????????????Nuclear pleomorphism:???????2  ?????????????Mitotic score:???????????????????????1 (1/10 HPF, 0.55 mm field diameter)  - Ductal carcinoma in situ (DCIS), micropapillary and cribriform, without central necrosis, low nuclear grade  - Lymph/vascular invasion: Not identified  - Microcalcifications: Not identified   - Breast biomarkers: See report below  - HER2/neu FISH:?Pending?and will be reported separately  RESULT ER/PR  ? ESTROGEN RECEPTORS PROGESTERONE RECEPTORS   Antibody                Clone SP1               Clone 636   %Tumor Staining 95% 40%   Intensity (1+ to 3+) 3+ 1-2+   HER-2/neu IHC assay (utilizing FDA-approved DAKO Hercep Test): J. Clin. Oncol 2013; 1-18, and Arch Pathol Lab Med 8119:1-47.   ?  Test Score:   1+  ?  HER2/neu: No overexpression  Ki-67: 1-2%    Imaging   I independently reviewed imaging.   12/01/2018 bilateral breast tomo + U/S screening     MAMMOGRAM FINDINGS:     The breast tissue is extremely dense, which lowers the sensitivity of mammography (density D). There are post-surgical scars seen in both breasts.     No suspicious masses, calcifications or other abnormalities are seen in either breast.     ULTRASOUND FINDINGS:     Whole breast and axillary sonography was performed in both breasts. Ultrasound demonstrates post  surgical scars seen in both breasts.     There is no evidence of any suspicious solid mass or abnormal cystic elements.     IMPRESSION:  Post surgical scars in both breasts are benign.  Routine screening mammogram in 1 year is recommended.     BI-RADS Category 2:  Benign Finding(s)    12/11/16 bilateral breast tomo + U/S screening  MAMMOGRAM FINDINGS:     The breast tissue is extremely dense, which lowers the sensitivity of mammography (density D).     There are post-surgical changes seen in the left breast at 12 o'clock, upper outer quadrant.  No suspicious masses, calcifications or other abnormalities are seen in either breast.     No significant interval change is identified.     ULTRASOUND FINDINGS:     Whole breast and axillary sonography was performed in both breasts.     Ultrasound demonstrates a scar seen in the left breast at 12 o'clock, upper outer quadrant.     There is no evidence of any suspicious solid mass or abnormal cystic elements.     In the right breast, there is no evidence of any suspicious solid mass or other abnormality.     No axillary lymphadenopathy.     IMPRESSION:     ?  There is no mammographic or sonographic evidence of malignancy.     Routine screening mammogram in 1 year is recommended.     BI-RADS Category 2:        Benign Finding(s)    01/15/2016 Bilateral diagnostic breast ultrasound  RIGHT DIAGNOSTIC BREAST ULTRASOUND AND LEFT DIAGNOSTIC BREAST ULTRASOUND   ULTRASOUND FINDINGS:    Finding 1:  Targeted breast and axillary sonography was performed in the left breast in the area of the pain in the upper outer quadrant. There is an irregular not parallel vascular irregular mass with indistinct margins measuring 5 x 6 x 4 mm seen in the left breast at 12 o'clock located 6 centimeters from the nipple. Internal echotexture is hypoechoic. There are decreased posterior echoes; edge shadows are excluded. This mass is seen along the medial border of the left breast scar.    Finding 2:  Targeted breast and axillary sonography was performed in the right breast in the area of the pain. There is no sonographic correlate in the area of the pain in the lower outer quadrant of the right breast.  No axillary lymphadenopathy.    IMPRESSION:    Finding 1:  Irregular mass in the upper outer quadrant of the left breast is suspicious. An ultrasound guided biopsy is recommended. Although this structure may represent post-surgical change/scar, tissue diagnosis is suggested.  Findings and recommendations were discussed with the patient following the procedure, and she is amenable to biopsy. The recommendation for biopsy was also communicated to ordering provider Anselm Jungling via email.  Finding 2:  There is no evidence of malignancy.                                                                                 11/03/11 DEXA [[facility]]  PA Spine:       The average bone mineral density from L1-4 is 0.787 g/cm2, 2.4 standard deviations below the mean for normal young adults (*T-scores) or 75% of normal. Compared to age and sex-matched controls (**Z-scores), this density is 0.9 standard deviations   below the mean.  ?  Total Hip:       The bone mineral density in the left total hip is 0.706 g/cm2, 1.9 standard deviations below the mean for normal young adults (*T-scores) or 75% of normal.  Compared to age and sex-matched controls (**Z-scores), this density is 1.0 standard   deviations below the mean.  ?  Femoral Neck:       The bone mineral density in the  left femoral neck is 0.531 g/cm2, 2.9 standard deviations below the mean for normal young adults (*T-scores) or 63% of normal.  Compared to age and sex-matched controls (**Z-scores), this density is 1.6 standard   deviations below the mean.  ?  *T-score compares the BMD of an individual to peak bone mass and expresses the difference as a standard deviation score,**Z-score compares the BMD of an individual with age-matched, gender-matched, and race-matched controls and expresses the difference   as a standard deviation score.  ?  IMPRESSION:  1.  This is a baseline study at the [facility].  2.  The patient has osteoporosis.    Assessment & Recommendations   Robin Spencer was seen in Multidisciplinary Breast Clinic with NP Samuel Jester, Surgical Oncologist Dr William Hamburger, Radiation Oncologist Dr Hilaria Ota, and [facility].    Robin Spencer is a 68 y.o. post-menopausal female with ER+PR+HER2- (IHC) invasive ductal carcinoma of the left breast s/p lumpectomy with sentinel lymph node excision 02/29/16, s/p adjuvant XRT 03/27/16-04/25/16, now on tamoxifen.      ICD-10-CM    1. Invasive ductal carcinoma of left breast, ER+PR+HER2- C50.912    2. Age-related osteoporosis without current pathological fracture M81.0    3. S/P breast lumpectomy Z98.890    4. History of therapeutic radiation Z92.3      1. ER+PR+HER2- invasive ductal carcinoma in a menopausal woman. Stage IIA (pT2N0) based on pathological staging. I discussed an aromatase inhibitor x5 years vs tamoxifen x5-10 years. Side effects of aromatase inhibitors include loss of bone density, menopausal symptoms such as hot flashes, and joint stiffness/muscle aches. The side effects of tamoxifen include increased risk of blood clots, a low risk of endometrial cancer, and interference with medications cleared through the cytochrome P450 pathway in the liver (including the first generation SSRIs and coumadin).   - s/p Lumpectomy with sentinel lymph node biopsy with Dr Francella Solian 02/29/16.  - s/p radiation therapy 03/27/16-04/25/16.   - Robin Manson Passey and I discussed starting tamoxifen x5-10 years instead of an aromatase inhibitor for the following reasons: 1) she's on low-dose HRT (DHEA, which is converted to estrogen) for vaginal atrophy and 2) she has osteoporosis. We discussed the risk of uterine cancer (04/998) in particular because Robin Ramaker's mother had uterine cancer. Based on family history, I don't have a suspicion for Lynch syndrome. She is tolerating tamoxifen well and continues to use vaginal estrogens, so will keep her on tamoxifen.     2. Osteoporosis.  DEXA 04/17/16 showed osteoporosis of the lumbar spine.  - Baseline DEXA scan prior to aromatase inhibitor, then q2 years. Currently she's on tamoxifen, so her bone density should increase.  - I recommend VitD 2000 U/day  - recommend calcium 1200mg /day in divided doses  - weight bearing exercise    3. Vaginal atrophy.   - As long as she's on tamoxifen, I am not opposed to intravaginal estrogen. Often gyn uses higher dose estrogen initially to build up the lining of the vagina, then tapers off to a maintenance dose.  - provided list of moisturizers and lubricants to try.     4. Genetic counseling and testing. Does not meet criteria for genetic testing.     Return to Clinic: 6 months, sooner prn    The patient is in agreement with the plan above. The patient and family's questions were answered to their satisfaction. They know how to contact us if they have additional questions or concerns. More than 50% of this  20 minute encounter was spent in face-to-face counseling regarding cancer education, tamoxifen vs AI, prognosis, vaginal atrophy. The patient was provided with notes summarizing our conversation as documented in the After Visit Summary.       Evonnie Dawes, MD, PhD  [facility]   [facility] Hematology/Oncology  [email or web address]  Pager [phone]    [facility] Hematology/Oncology [place]  [address] [place], [place] [address]  Phone [phone], Fax [phone]  Appts: Wednesday 1-5PM, Friday 8-5PM [facility] Hematology/Oncology   [address]  [place], [place], [address]  Phone [phone], Fax [phone]  Appts: Thursday 8-5PM

## 2018-12-13 ENCOUNTER — Ambulatory Visit: Payer: MEDICARE

## 2019-01-04 MED ORDER — CYCLOBENZAPRINE HCL 5 MG PO TABS
5 mg | ORAL_TABLET | Freq: Every evening | ORAL | 5 refills | 10.00000 days | Status: AC
Start: 2019-01-04 — End: ?

## 2019-01-04 MED ORDER — YUVAFEM 10 MCG VA TABS
ORAL_TABLET | 1 refills
Start: 2019-01-04 — End: ?

## 2019-01-05 MED ORDER — YUVAFEM 10 MCG VA TABS
ORAL_TABLET | 1 refills | Status: AC
Start: 2019-01-05 — End: ?

## 2019-01-27 NOTE — Patient Instructions
[facility] wants you to have a voice in your healthcare. Completing an advance directive is a good way to ensure that we honor your wishes for health care. This allows you to:  ??? Name a trusted person to help make medical decisions in case of an emergency  ??? Tell this trusted person and your doctors what is most important in your life and for your healthcare now and in the future     You are receiving this message because you do not have an enduring advance directive (or POLST) in the [facility] medical record or your document is more than five years old. Your physician wants to ensure that you have an advance directive and it is current. Please look over and fill in an advance directive. You can access a [facility] advance directive at [email or web address]    Discuss your advance directive and wishes for medical care at the next visit with your physician. Turn in your advance directive to your physician by mail or FAX, through your [facility] portal, or bring it to clinic

## 2019-01-30 ENCOUNTER — Ambulatory Visit: Payer: BC Managed Care – POS

## 2019-01-31 NOTE — Telephone Encounter
Hello Lorrie! Please contact the patient to set up asymptomatic testing.  I let her know we cannot test her husband or other family members, they should contact their MDs. JP

## 2019-02-01 NOTE — Telephone Encounter
Patient is scheduled for COVID testing on 02/08/19 at [facility]

## 2019-02-03 ENCOUNTER — Ambulatory Visit: Payer: BC Managed Care – POS

## 2019-02-03 DIAGNOSIS — M79674 Pain in right toe(s): Secondary | ICD-10-CM

## 2019-02-03 DIAGNOSIS — L851 Acquired keratosis [keratoderma] palmaris et plantaris: Secondary | ICD-10-CM

## 2019-02-03 DIAGNOSIS — B351 Tinea unguium: Secondary | ICD-10-CM

## 2019-02-03 DIAGNOSIS — L603 Nail dystrophy: Secondary | ICD-10-CM

## 2019-02-03 DIAGNOSIS — M79675 Pain in left toe(s): Secondary | ICD-10-CM

## 2019-02-04 NOTE — Progress Notes
Patient is a pleasant 68 year old woman who presents to the office complaining of thickened yellowed nails on all the toes of both feet as well as some thickened skin on the great toes of both feet.  Patient states she is not in a lot of discomfort but it does seem to be getting thicker.  Patient states she has minimal discomfort with any type of pressure to the area.  Patient Active Problem List   Diagnosis   ? Osteoporosis   ? Eye allergies   ? Genital HSV   ? PPD positive   ? Palpitations   ? OA (osteoarthritis)   ? Visit for preventive health examination   ? Hearing loss, right   ? Seborrheic dermatitis   ? Menopause   ? BPPV (benign paroxysmal positional vertigo)   ? Fibroid   ? Cataract, nuclear sclerotic, both eyes   ? Status post LASIK surgery of both eyes   ? Breast pain   ? Invasive ductal carcinoma of left breast, ER+PR+HER2-   ? Immune to measles   ? Syncope and collapse   ? Increased PTH level   ? Vitreous floater, bilateral   ? Asymptomatic varicose veins   ? Rash, Intermittent, noted after colds   ? Fibromyalgia   ? Mucous cyst of digit of left hand     Outpatient Medications Prior to Visit   Medication Sig   ? ACYCLOVIR 400 mg tablet TAKE 1 TABLET (400 MG TOTAL) BY MOUTH TWO (2) TIMES DAILY.   ? BIOTIN PO Take 1 tablet by mouth daily .     ? CALCIUM CITRATE PO Take 630 mg by mouth daily.   ? Cholecalciferol (VITAMIN D3) 5000 units CAPS Take 1 capsule by mouth daily.   ? cyanocobalamin 500 mcg tablet Take 500 mcg by mouth daily.   ? cyclobenzaprine 5 mg tablet Take 1 tablet (5 mg total) by mouth at bedtime.   ? Homeopathic Products (TRAUMEEL) OINT Apply 4 g topically daily.   ? MAGNESIUM CITRATE PO Take 150 mg by mouth two (2) times daily.   ? Omega-3 Fatty Acids (FISH OIL PO) Take 1,400 mg by mouth two (2) times daily.   ? oxybutynin 5 mg tablet Take 1 tablet (5 mg total) by mouth two (2) times daily. ? oxyCODONE 5 mg tablet Take 1 tablet (5 mg total) by mouth every six (6) hours as needed. Max Daily Amount: 20 mg (Patient not taking: Reported on 11/30/2018.)   ? tamoxifen 20 mg tablet Take 1 tablet (20 mg total) by mouth daily.   ? triamcinolone 0.5% cream Apply topically as needed for.   ? UNABLE TO FIND Med Name: [place]ed[place] supplement- Epimedium, Tumeric, Japanese, Fleeceflower, EPA/DHA .   ? YUVAFEM 10 MCG vaginal tablet PLACE 1 TABLET (10 MCG TOTAL) VAGINALLY TWO (2) TIMES A WEEK.     No facility-administered medications prior to visit.      Allergies   Allergen Reactions   ? Penicillins Other (See Comments) and Rash     Ampicillin, flesh rash in 1980s   ? Menadiol Sodium Diphosphate Rash   ? Other      [place] coloring on meds such as advil- not allergic to actual med- just the coloring   ? Acetaminophen      Denies this allergy     Past Surgical History:   Procedure Laterality Date   ? BREAST BIOPSY  1986    Fibroadenoma, ductal hyperplasia   ? Laparoscopy for subfertility     ?  Left finger cyst removal x 2     ? Stapedectomy, right       Social History     Socioeconomic History   ? Marital status: Married     Spouse name: Not on file   ? Number of children: Not on file   ? Years of education: Not on file   ? Highest education level: Not on file   Occupational History   ? Not on file   Social Needs   ? Financial resource strain: Not on file   ? Food insecurity     Worry: Not on file     Inability: Not on file   ? Transportation needs     Medical: Not on file     Non-medical: Not on file   Tobacco Use   ? Smoking status: Never Smoker   ? Smokeless tobacco: Never Used   Substance and Sexual Activity   ? Alcohol use: No     Alcohol/week: 0.0 oz   ? Drug use: No   ? Sexual activity: Yes     Partners: Male     Birth control/protection: None   Lifestyle   ? Physical activity     Days per week: Not on file     Minutes per session: Not on file   ? Stress: Not on file   Relationships   ? Social Psychologist, counselling on phone: Not on file     Gets together: Not on file     Attends religious service: Not on file     Active member of club or organization: Not on file     Attends meetings of clubs or organizations: Not on file     Relationship status: Not on file   Other Topics Concern   ? Not on file   Social History Narrative    Patient is married, and has two children. She is of Ashkenazi Agricultural engineer.        Work: Retired from [employer] relations        Exercise:        Diet:        Transfusion:        Religion:     Family History   Problem Relation Age of Onset   ? Hypertension Father    ? Stroke Father    ? Hyperlipidemia Father    ? Depression Father    ? Prostate cancer Father 67   ? Hypertension Mother    ? Asthma Mother    ? Endometrial cancer Mother 55   ? Hyperthyroidism Mother    ? Prostate cancer Brother 43   ? Breast cancer Neg Hx    ? Ovarian cancer Neg Hx    ? Malignant hypertension Neg Hx    Dermatological patient's skin texture and turgor are within normal limits patient's nails are mycotic in nature patient's nails are thickened and yellowed there is no signs of any bacterial infections.  Patient has hyperkeratotic tissue which is slightly dried around the medial aspect of the great toes of both feet as well as around the heels there is no signs of any skin fissures.  Vascular patient's posterior tibial and dorsalis pedis pulses are palpable.  Patient's feet are warm to the touch patient has no lower extremity edema.  Neurologic patient's sensory functions including sharp and dull within normal limits  Muscular skeleton patient has normal range of motion normal muscle strength. Treatment consultation with the patient with the use  of a nail Nipper the hypertrophic dystrophic nails carefully debrided the nails mechanically debrided the use of a scalpel blade the thickened hyperkeratotic tissue was debrided as well patient was encouraged apply skin cream after she bathes return to office as needed

## 2019-02-08 ENCOUNTER — Institutional Professional Consult (permissible substitution): Payer: BC Managed Care – POS

## 2019-02-08 DIAGNOSIS — Z20828 Contact with and (suspected) exposure to other viral communicable diseases: Secondary | ICD-10-CM

## 2019-02-09 LAB — COVID-19 and Influenza A/B PCR: COVID-19 PCR: NOT DETECTED

## 2019-04-06 DIAGNOSIS — N898 Other specified noninflammatory disorders of vagina: Secondary | ICD-10-CM

## 2019-04-06 DIAGNOSIS — C50912 Malignant neoplasm of unspecified site of left female breast: Secondary | ICD-10-CM

## 2019-04-07 ENCOUNTER — Ambulatory Visit: Payer: BC Managed Care – POS

## 2019-04-07 ENCOUNTER — Ambulatory Visit: Payer: MEDICARE

## 2019-04-07 ENCOUNTER — Inpatient Hospital Stay: Payer: BLUE CROSS/BLUE SHIELD

## 2019-04-07 DIAGNOSIS — R296 Repeated falls: Secondary | ICD-10-CM

## 2019-04-07 DIAGNOSIS — M25562 Pain in left knee: Secondary | ICD-10-CM

## 2019-04-07 DIAGNOSIS — K649 Unspecified hemorrhoids: Secondary | ICD-10-CM

## 2019-04-07 LAB — Bacterial Vaginosis Screen

## 2019-04-07 MED ORDER — HYDROCORTISONE ACETATE 25 MG RE SUPP
25 mg | Freq: Two times a day (BID) | RECTAL | 1 refills | 7.00000 days | Status: AC
Start: 2019-04-07 — End: ?

## 2019-04-07 NOTE — Nursing Note
Nursing Note: Specimen Sent to Lab    Patient: Robin Spencer MRN: [id number] DOB: 12-25-1950 AGE: 69 y.o.  Date of service: 04/07/2019  Provider: Ranae Pila. Pregler, MD    Signed off in clinic log book at: 11:11 AM    Specimen/s collected and submitted:   ? White tube submitted to lab for processing.    Kenton Kingfisher, [place]  04/07/2019 ~ 11:14 AM

## 2019-04-07 NOTE — Progress Notes
PATIENT: Robin Spencer  MRN: [id number]  DOB: May 21, 1950  DATE OF SERVICE: 04/07/2019    CHIEF COMPLAINT:   Chief Complaint   Patient presents with   ? Vaginal Discharge   ? Hemorrhoids        HPI   Robin Spencer is a 69 y.o. female presents for   Chief Complaint   Patient presents with   ? Vaginal Discharge   ? Hemorrhoids       1.  Vaginal discharge/History of breast cancer:  The patient noticed vaginal discharge x 5 weeks. On TAM after breast cancer.  Noted intermittently in pubic hair. Not bloody or [place].  Not sure of consistency, usually dried.  Husband had UTI in the past weeks, E-coli.  Has not noted prior. Uses Uvafem consistently.  No odor.  Not itchy.     2.  Hemorrhoids:  Noted on colonoscopy, has had prolapse.  Feels uncomfortable, not painful.  Some pain and blood with defecation. Stool is firm but not constipated, gets constipated when not in usual environment.     3.  Recurrent falls:  2 this year with one fall on knees that caused tender area over patella. Will refer to PT for gait training.     4.  Irregular area on left middle finger nail: After treatment by Dr. Augusto Garbe, will discuss with him.      5. Menopause: Using cyclobenzaprine, told may have FMS.     Got 1st COVID vaccine yesterday. [facility].      MEDS     Medications that the patient states to be currently taking   Medication Sig   ? ACYCLOVIR 400 mg tablet TAKE 1 TABLET (400 MG TOTAL) BY MOUTH TWO (2) TIMES DAILY.   ? BIOTIN PO Take 1 tablet by mouth daily .     ? CALCIUM CITRATE PO Take 630 mg by mouth daily.   ? Cholecalciferol (VITAMIN D3) 5000 units CAPS Take 1 capsule by mouth daily.   ? cyanocobalamin 500 mcg tablet Take 500 mcg by mouth daily.   ? cyclobenzaprine 5 mg tablet Take 1 tablet (5 mg total) by mouth at bedtime.   ? Homeopathic Products (TRAUMEEL) OINT Apply 4 g topically daily.   ? MAGNESIUM CITRATE PO Take 150 mg by mouth two (2) times daily. ? Omega-3 Fatty Acids (FISH OIL PO) Take 1,400 mg by mouth two (2) times daily.   ? tamoxifen 20 mg tablet Take 1 tablet (20 mg total) by mouth daily.   ? triamcinolone 0.5% cream Apply topically as needed for.   ? UNABLE TO FIND Med Name: Promedley supplement- Epimedium, Tumeric, Japanese, Fleeceflower, EPA/DHA .   ? YUVAFEM 10 MCG vaginal tablet PLACE 1 TABLET (10 MCG TOTAL) VAGINALLY TWO (2) TIMES A WEEK.       PHYSICAL EXAM      Last Recorded Vital Signs:    04/07/19 1001   BP: 119/77   Pulse: 89   Resp: 16   Temp: 36.8 ?C (98.2 ?F)   SpO2: 98%     Body mass index is 21.4 kg/m?Marland Kitchen     A trained chaperone was present for this exam/procedure: No  Reason why a trained chaperone was not present: Patient declined      System Check if normal Positive or additional negative findings   Constit  []  General appearance     Eyes  []  Conj/Lids []  Pupils  []  Fundi     HENMT  []  External ears/nose []   Otoscopy   []  Gross Hearing []  Nasal mucosa   []  Lips/teeth/gums []  Oropharynx    []  mucus membranes []  Head     Neck  []  Inspection/palpation []  Thyroid     Resp  []  Effort []  Wheezing    []  Auscultation  []  Crackles     CV  []  Rhythm/rate   []  Murmurs   []  LEE   []  JVP non-elevated    Normal pulses:   []  Radial []  Femoral  []  Pedal     Breast  []  Inspection []  Palpation     GI  []  abd masses    []  tenderness   []  rebound/guarding   []  Liver/spleen []  Rectal     GU  M: []  Scrotum []  Penis []  Prostate   F:  [x]  External [x]  vaginal wall        [x]  Cervix  []  mucus        []  Uterus    []  Adnexa   Atrophy, clear discharge.  External hemorrhoids.    Lymph  []  Neck []  Axillae []  Groin     MSK Specify site examined:    []  Inspect/palp []  ROM   []  Stability []  Strength/tone         Skin  []  Inspection []  Palpation     Neuro  []  CN2-12 intact grossly   []  Alert and oriented   []  DTR      []  Muscle strength      []  Sensation   []  Gait/balance     Psych  []  Insight/judgement     []  Mood/affect    []  Gross cognition        LABS/STUDIES See below.          A&P   Robin Spencer is a a 69 y.o. female presenting for   Chief Complaint   Patient presents with   ? Vaginal Discharge   ? Hemorrhoids         PROBLEM & ORDERS    ICD-10-CM    1. Vaginal irritation  N89.8 Bacterial Vaginosis Screen   2. Invasive ductal carcinoma of left breast (HCC/RAF)  C50.912    3. Recurrent falls  R29.6 Referral to Rehabilitation, Physical Therapy   4. Hemorrhoids, unspecified hemorrhoid type  K64.9    5. Left knee pain, unspecified chronicity  M25.562 XR knee ap+lat left (2 views)     Orders Placed This Encounter   ? Bacterial Vaginosis Screen   ? XR knee ap+lat left (2 views)   ? Referral to Rehabilitation, Physical Therapy   ? hydrocortisone 25 mg suppository     ASSESSMENT    1.  Vaginal discharge:  The patient noticed vaginal discharge x 5 weeks.  Noted intermittently in pubic hair. Not bloody or [place].  Not sure of consistency, usually dried.  Husband had UTI in the past weeks, E-coli.  Has not noted prior. Uses Uvafem consistently.  No odor.  Not itchy.     2.  Hemorrhoids:  Noted on colonoscopy, has had prolapse.  Feels uncomfortable, not painful.  Some pain and blood with defecation. Stool is firm but not constipated, gets constipated when not in usual environment.     3.  Recurrent falls:  2 this year with one fall on knees that caused tender area over patella. Will refer to PT for gait training. Will see Dr. Ladona Ridgel for knee pain, and get xray.      The above recommendation were discussed with the patient.  The patient has all questions answered satisfactorily and is in agreement with this recommended plan of care.    Return in 5 weeks (on 05/12/2019) for Annual Wellness- 30 minutes.     Author:  Weyman Croon. Wilkin Lippy 04/07/2019 11:06 AM

## 2019-04-07 NOTE — Patient Instructions
(  1)  Tell her I recommend Senna (an herbal laxative) 15 mg daily and Miralax (a powder) one tablespoon in 8 ounces of water daily if psyllium doesn't work.  Aim for stool consistency of toothpaste.     (2) See Dr. Ladona Ridgel for knee pain.     (3) See Dr. Augusto Garbe regarding nail change on left middle finger.

## 2019-04-08 MED ORDER — METRONIDAZOLE 0.75 % VA GEL
1 | Freq: Every evening | VAGINAL | 0 refills | 5.00000 days | Status: AC
Start: 2019-04-08 — End: ?

## 2019-04-09 MED ORDER — ACYCLOVIR 400 MG PO TABS
400 mg | ORAL_TABLET | Freq: Two times a day (BID) | ORAL | 1 refills | 30.00000 days
Start: 2019-04-09 — End: ?

## 2019-04-10 ENCOUNTER — Ambulatory Visit: Payer: MEDICARE

## 2019-04-11 ENCOUNTER — Institutional Professional Consult (permissible substitution): Payer: BC Managed Care – POS

## 2019-04-11 ENCOUNTER — Telehealth: Payer: BC Managed Care – POS | Attending: Student in an Organized Health Care Education/Training Program

## 2019-04-11 DIAGNOSIS — Z20822 Suspected COVID-19 virus infection: Secondary | ICD-10-CM

## 2019-04-11 MED ORDER — ACYCLOVIR 400 MG PO TABS
400 mg | ORAL_TABLET | Freq: Two times a day (BID) | ORAL | 1 refills | 30.00000 days | Status: AC
Start: 2019-04-11 — End: ?

## 2019-04-11 NOTE — Patient Instructions
A note from your doctor:    Thank you for choosing [facility] for your care.    Our plan, as discussed today:    1. We will order you the COVID19 test     Your return appointment should be scheduled for No follow-ups on file.      Please call the office at [phone] if you need to return sooner for any reason. My clinic partners for days when I am off-site are:  Kandice Hams MD   Marliss Coots MD   Daylene Katayama MD   Janifer Adie MD    ----------------------------------------------------------------------------------------------------------------------    Frequently asked questions:    1. How do I follow through on the plan from my office visit?    ? Written instructions/advice: Stop in our Check-Out room in the clinic before walking out of the clinic to the waiting room. They will print out any written advice from your visit on an ?After Visit Summary.?    ? Laboratory tests: You may show up to any  laboratory and provide your name and date of birth and they will be able to find the orders for the lab tests:    o In our clinic ([address], [address]):  ? Monday to Friday (7:30 am to 5:00 pm)  o Main laboratory ([address], [address]):   ? Monday to Friday (6:00 am to 7:00 pm)   ? Weekends and holidays (7:00 am to 3:30 pm)  o [facility] [phone] [address])  ? Monday to Friday (8:00 am to 6:00 pm).  o On request, we can send to outside labs (e.g. [facility] or [facility])    ? Imaging studies: General X-Ray can be done same-day on the first floor of our building. Advanced imaging and diagnostic studies generally require insurance review and approval prior to scheduling. The Check-Out staff will provide information for scheduling. ? Referrals: Stop in the Check-Out room; the staff will verify that the referral has been placed. They will inform you if you can schedule your appointment immediately or if you need to wait for insurance authorization. They will also provide information for scheduling.    ? Follow-up: Stop in the Check-Out room and the staff will schedule your follow-up visit. If you prefer to schedule at a later time, you can call our Call Center at [phone] or request an appointment online through [facility].  If you have seen someone other than your primary care provider for an urgent visit, it is okay to schedule your follow-up with either the doctor who saw you for urgent care or your primary care physician.     ? Outside records requests: If your doctor has indicated that outside records should be requested, please sign the form ?Authorization for Release of Health Information? at the Check-Out before you leave. We cannot request your personal health records from other doctors/hospitals without your written permission.    2. How do I get my test results from the visit?    ? If you sign up for [facility] online ([email or web address]), we will release test results online with comments once your test results are available, usually within 1 week. Some specialized test results may take several weeks.  ? If you are not signed up for [facility] when your results become available, we will send your test results by letter within 1-2 weeks of your visit.    ? If an urgent test is ordered in your visit, such as  an X-ray to look for a broken bone, our team will give you a call to make sure that you are aware of the results.  ? If another doctor orders a test for you, it is best to speak first with that doctor directly about the result.  ? Please contact our office if you have not received a result in the expected time frame. ? Please make sure your address and phone number are up-to-date in our system when you check in. We use these to contact you about important health information, including test results.    3. Can I ask questions after the visit?    Yes! It is important to let us know if you have any trouble with the treatment plan that we agree upon or if your symptom(s) are not improving as expected. For non-emergency contact, you may reach your doctor two ways:    ? Online: send non-urgent medical questions through the [facility] website ([email or web address]). These are usually returned within 2 business days.  ? Call the [facility] Call Center at [phone] during business hours to leave a message (or schedule a return appointment).  o Our call center staff are trained to gather information that will help your doctor?s office respond to your message quickly and correctly. Please give them as much information as possible to help them transmit your call to the appropriate staff in your doctor?s office.  o Calls will usually be returned on the same business day unless the doctor is out of the office, in which case they will be forwarded to the covering doctor within 1 business day.    4. How do I request a medication refill?    ? If you sign up for [facility] online, you can request refills under the ?Messaging? section titled ?Request Rx Refill.?  ? You can ask your pharmacy to contact our office directly with a refill request.   ? You can call our Call Center yourself with a refill request.   ? Important guidelines for medication requests:   o New medications generally require an office visit for medical assessment and medication counseling. o Medication safety monitoring is different for every medication. Please establish a plan with your doctor for the frequency of office visits, laboratory tests, urine screening, EKGs, blood pressure checks, and any other recommended monitoring for your medications. If the monitoring is not up-to-date, you will be asked to come in to the office (or laboratory, if needed) prior to the refill approval.  o Most long-term medications require a visit at least annually to check up on your health.  o Medications prescribed by a specialist may require refills through the office of the specialist.  o DEA scheduled medications cannot be prescribed electronically and require a signed paper prescription from your doctor.  ? DEA Schedule II medications require a primary care visit for each refill request. Please plan ahead to schedule these visits with your personal primary care doctor. Common examples include: opiate medications such as morphine or oxycodone and stimulants such as amphetamine (Adderall).    ? Other DEA Schedule medications (Schedule III, IV, and V) also require regular primary care office visits for refills. Common examples include: tramadol (Ultram), carisoprodol (Soma), clonazepam (Klonopin), alprazolam (Xanax), lorazepam (Ativan), zolpidem (Ambien), pregabalin (Lyrica), testosterone.      5. How do I get after-hours care?    [email or web address]: We strive to offer same-day appointments in our office Monday-Friday, 8am-5pm. Call during these hours to request an urgent appointment: [phone].     Evenings/weekends: Our colleagues offer access to after-hours care at our walk-in clinic:    [facility]  [address], [place]   [phone]  Monday - Friday: 10:00 am to 9:00 pm  Saturday - Sunday: 9:00 am to 5:00 pm  Laboratory and x-ray services available [facility] has additional urgent care sites, see locations at: [email or web address]    Paging: If you need to have your doctor (or the on-call doctor) paged through our emergency line, call [phone].    For serious and life-threatening concerns you should call 911.    Emergency Services    [facility]   Emergency Department  [address]  [place], [place] [address]  Hospital Information: [phone]  Emergency Department: [phone]    [facility]   Emergency Department  [address]  [place], [place] [address]  Hospital Information: [phone]  Emergency Department: [phone]

## 2019-04-11 NOTE — Progress Notes
DEPARTMENT OF MEDICINE PROGRESS NOTE - VIDEO VISIT    PATIENT:  Robin Spencer  MRN:  [id number]  DOB:  11/26/1950  DATE OF SERVICE:  04/11/2019  PRIMARY CARE PROVIDER: Pregler, Weyman Croon., MD    Cc:    Chief Complaint   Patient presents with   ? ILI/PUI     Subjective:       Patient Consent to Telehealth Questionnaire   [facility] TELEHEALTH PRECHECKIN QUESTIONS 04/11/2019   By clicking ''I Agree'', I consent to the below:  I Agree     - I agree  to be treated via a video visit and acknowledge that I may be liable for any relevant copays or coinsurance depending on my insurance plan.  - I understand that this video visit is offered for my convenience and I am able to cancel and reschedule for an in-person appointment if I desire.  - I also acknowledge that sensitive medical information may be discussed during this video visit appointment and that it is my responsibility to locate myself in a location that ensures privacy to my own level of comfort.  - I also acknowledge that I should not be participating in a video visit in a way that could cause danger to myself or to those around me (such as driving or walking).  If my provider is concerned about my safety, I understand that they have the right to terminate the visit.     [name] is a a 69 y.o. female who presents for preventive care and management of the following problems:      Patient presents today for a video visit. Patient reports being exposed to mother's boyfriend who tested positive for COVID on 1/24. Patient's mother also reporting to be symptomatic and in the ED. Spent entire day with her mother at the time.     Denied any fever, chills, sxs.     Patient Active Problem List   Diagnosis   ? Osteoporosis   ? Eye allergies   ? Genital HSV   ? PPD positive   ? Palpitations   ? OA (osteoarthritis)   ? Visit for preventive health examination   ? Hearing loss, right   ? Seborrheic dermatitis   ? Menopause   ? BPPV (benign paroxysmal positional vertigo) ? Fibroid   ? Cataract, nuclear sclerotic, both eyes   ? Status post LASIK surgery of both eyes   ? Breast pain   ? Invasive ductal carcinoma of left breast, ER+PR+HER2-   ? Immune to measles   ? Syncope and collapse   ? Increased PTH level   ? Vitreous floater, bilateral   ? Asymptomatic varicose veins   ? Rash, Intermittent, noted after colds   ? Fibromyalgia   ? Mucous cyst of digit of left hand       Allergies   Allergen Reactions   ? Penicillins Other (See Comments) and Rash     Ampicillin, flesh rash in 1980s   ? Menadiol Sodium Diphosphate Rash   ? Other      Red [place] coloring on meds such as advil- not allergic to actual med- just the coloring   ? Acetaminophen      Denies this allergy       No outpatient medications have been marked as taking for the 04/11/19 encounter (Telemedicine) with Maxie Barb., MD.     Past Medical History:   Diagnosis Date   ? Anxiety    ? Breast cancer (  HCC/RAF)    ? Eczema    ? Fibroids    ? GERD (gastroesophageal reflux disease)     esophagitis   ? History of migraine headaches    ? History of radiation therapy 04/2016    L breast   ? Otosclerosis    ? Post-operative nausea and vomiting    ? Rectal fissure    ? Vitreous detachment        Past Surgical History:   Procedure Laterality Date   ? BREAST BIOPSY  1986    Fibroadenoma, ductal hyperplasia   ? Laparoscopy for subfertility     ? Left finger cyst removal x 2     ? Stapedectomy, right         Social History     Tobacco Use   Smoking Status Never Smoker   Smokeless Tobacco Never Used       Social History     Socioeconomic History   ? Marital status: Married     Spouse name: Not on file   ? Number of children: Not on file   ? Years of education: Not on file   ? Highest education level: Not on file   Occupational History   ? Not on file   Social Needs   ? Financial resource strain: Not on file   ? Food insecurity     Worry: Not on file     Inability: Not on file   ? Transportation needs     Medical: Not on file Non-medical: Not on file   Tobacco Use   ? Smoking status: Never Smoker   ? Smokeless tobacco: Never Used   Substance and Sexual Activity   ? Alcohol use: No     Alcohol/week: 0.0 oz   ? Drug use: No   ? Sexual activity: Yes     Partners: Male     Birth control/protection: None   Lifestyle   ? Physical activity     Days per week: Not on file     Minutes per session: Not on file   ? Stress: Not on file   Relationships   ? Social Wellsite geologist on phone: Not on file     Gets together: Not on file     Attends religious service: Not on file     Active member of club or organization: Not on file     Attends meetings of clubs or organizations: Not on file     Relationship status: Not on file   Other Topics Concern   ? Not on file   Social History Narrative    Patient is married, and has two children. She is of Ashkenazi Agricultural engineer.        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 Hypertension Father    ? Stroke Father    ? Hyperlipidemia Father    ? Depression Father    ? Prostate cancer Father 102   ? Hypertension Mother    ? Asthma Mother    ? Endometrial cancer Mother 79   ? Hyperthyroidism Mother    ? Prostate cancer Brother 57   ? Breast cancer Neg Hx    ?  Ovarian cancer Neg Hx    ? Malignant hypertension Neg Hx        Review of Systems: 10 point review of systems normal except as stated above.    Objective:  PE:  GEN: NAD, conversive.   EYES: anicteric sclerae, moist conjunctivae; no lid-lag.   PULM: No increased work of breathing, no audible wheezes  PSYCH: normal affect and insight/mood, alert and oriented.    LABS:  Lab Results   Component Value Date    WBC 3.76 (L) 09/23/2016    HGB 13.1 09/23/2016    HCT 37.3 09/23/2016    MCV 99.7 (H) 09/23/2016    PLT 202 09/23/2016     Lab Results   Component Value Date    CREAT 0.72 05/10/2018    BUN 19 05/10/2018 NA 143 05/10/2018    K 4.5 05/10/2018    CL 102 05/10/2018    CO2 32 (H) 05/10/2018     Lab Results   Component Value Date    ALT 15 05/10/2018    AST 19 05/10/2018    ALKPHOS 56 05/10/2018    BILITOT 0.2 05/10/2018     Lab Results   Component Value Date    CHOL 210 05/10/2018    CHOLDLCAL 144 (H) 04/20/2014    TRIGLY 99 04/20/2014     Lab Results   Component Value Date    TSH 2.0 05/10/2018     No results found for: HGBA1C     Assessment & Plan:     Assessment:   1. Suspected COVID-19 virus infection    COVID19 test ordered       I have:   [x]  Reviewed/ordered 1 2 ? 3 unique laboratory, radiology, and/or diagnostic tests noted below    []  Reviewed 1 2 ? 3 prior external notes and incorporated into patient assessment    []  Discussed management or test interpretation with external provider(s) as noted      PROBLEM  [x]  Self-limited/minor: []  1   []  2  []  Chronic stable  []  1   []  2  []  Acute, uncomplicated illness or injury []  1   []  2  []  Chronic unstable  []  1   []  2  []  Undiagnosed/uncertain prognosis []  1   []  2  []  Acute systemic illness/acute complicated injury  []  1   []  2  []  Chronic severe exacerbation  []  1   []  2  []  Acute or chronic causing threat to life/bodily function  []  1   []  2     REVIEW OF DATA  Category 1:   []  Review of prior studies (listed above)  [x]  Review of prior labs, procedures, tests (listed above) [x]  1   []  2   []  3+  [x]  Review of prior/external notes  []  Review of new data (listed above)  [x]  Ordering medications, laboratory, diagnostic tests or procedures [x]  1   []  2   []  3+  []  Communication with an independent historian (16109, 99214 only)  Category 2:   []  Communication with an independent historian (60454, 99213 only)  []  Independent interpretation of test not separately reported  Category 3:   []  Discussion of care with physician in external department     RISK OF COMPLICATION  []  Minimal     [x]  Low   []  Moderate   []  Severe     SOCIAL DETERMINANTS OF HEALTH []  The diagnosis or treatment of said conditions is significantly limited by social determinants of  health    Visit Plan:   Orders Placed This Encounter   ? COVID-19 and Influenza - PCR, (Supv) Mid-turbinate     Patient Instructions   A note from your doctor:    Thank you for choosing [facility] for your care.    Our plan, as discussed today:    1. We will order you the COVID19 test     Your return appointment should be scheduled for No follow-ups on file.      Please call the office at [phone] if you need to return sooner for any reason. My clinic partners for days when I am off-site are:  Kandice Hams MD   Marliss Coots MD   Daylene Katayama MD   Janifer Adie MD    ----------------------------------------------------------------------------------------------------------------------    Frequently asked questions:    1. How do I follow through on the plan from my office visit?    ? Written instructions/advice: Stop in our Check-Out room in the clinic before walking out of the clinic to the waiting room. They will print out any written advice from your visit on an ?After Visit Summary.?    ? Laboratory tests: You may show up to any  laboratory and provide your name and date of birth and they will be able to find the orders for the lab tests:    o In our clinic ([address], [address] [place]):  ? Monday to Friday (7:30 am to 5:00 pm)  o Main laboratory ([address], [address]):   ? Monday to Friday (6:00 am to 7:00 pm)   ? Weekends and holidays (7:00 am to 3:30 pm)  o [facility] [phone] [address])  ? Monday to Friday (8:00 am to 6:00 pm).  o On request, we can send to outside labs (e.g. [facility] or [facility])    ? Imaging studies: General X-Ray can be done same-day on the first floor of our building. Advanced imaging and diagnostic studies generally require insurance review and approval prior to scheduling. The Check-Out staff will provide information for scheduling. ? Referrals: Stop in the Check-Out room; the staff will verify that the referral has been placed. They will inform you if you can schedule your appointment immediately or if you need to wait for insurance authorization. They will also provide information for scheduling.    ? Follow-up: Stop in the Check-Out room and the staff will schedule your follow-up visit. If you prefer to schedule at a later time, you can call our Call Center at [phone] or request an appointment online through [facility].  If you have seen someone other than your primary care provider for an urgent visit, it is okay to schedule your follow-up with either the doctor who saw you for urgent care or your primary care physician.     ? Outside records requests: If your doctor has indicated that outside records should be requested, please sign the form ?Authorization for Release of Health Information? at the Check-Out before you leave. We cannot request your personal health records from other doctors/hospitals without your written permission.    2. How do I get my test results from the visit?    ? If you sign up for [facility] online ([email or web address]), we will release test results online with comments once your test results are available, usually within 1 week. Some specialized test results may take several weeks.  ? If you are not signed up for [facility] when your results become  available, we will send your test results by letter within 1-2 weeks of your visit.    ? If an urgent test is ordered in your visit, such as an X-ray to look for a broken bone, our team will give you a call to make sure that you are aware of the results.  ? If another doctor orders a test for you, it is best to speak first with that doctor directly about the result.  ? Please contact our office if you have not received a result in the expected time frame. ? Please make sure your address and phone number are up-to-date in our system when you check in. We use these to contact you about important health information, including test results.    3. Can I ask questions after the visit?    Yes! It is important to let us know if you have any trouble with the treatment plan that we agree upon or if your symptom(s) are not improving as expected. For non-emergency contact, you may reach your doctor two ways:    ? Online: send non-urgent medical questions through the [facility] website ([email or web address]). These are usually returned within 2 business days.  ? Call the [facility] Internal Medicine Call Center at [phone] during business hours to leave a message (or schedule a return appointment).  o Our call center staff are trained to gather information that will help your doctor?s office respond to your message quickly and correctly. Please give them as much information as possible to help them transmit your call to the appropriate staff in your doctor?s office.  o Calls will usually be returned on the same business day unless the doctor is out of the office, in which case they will be forwarded to the covering doctor within 1 business day.    4. How do I request a medication refill?    ? If you sign up for [facility] online, you can request refills under the ?Messaging? section titled ?Request Rx Refill.?  ? You can ask your pharmacy to contact our office directly with a refill request.   ? You can call our Call Center yourself with a refill request.   ? Important guidelines for medication requests:   o New medications generally require an office visit for medical assessment and medication counseling. o Medication safety monitoring is different for every medication. Please establish a plan with your doctor for the frequency of office visits, laboratory tests, urine screening, EKGs, blood pressure checks, and any other recommended monitoring for your medications. If the monitoring is not up-to-date, you will be asked to come in to the office (or laboratory, if needed) prior to the refill approval.  o Most long-term medications require a visit at least annually to check up on your health.  o Medications prescribed by a specialist may require refills through the office of the specialist.  o DEA scheduled medications cannot be prescribed electronically and require a signed paper prescription from your doctor.  ? DEA Schedule II medications require a primary care visit for each refill request. Please plan ahead to schedule these visits with your personal primary care doctor. Common examples include: opiate medications such as morphine or oxycodone and stimulants such as amphetamine (Adderall).    ? Other DEA Schedule medications (Schedule III, IV, and V) also require regular primary care office visits for refills. Common examples include: tramadol (Ultram), carisoprodol (Soma), clonazepam (Klonopin), alprazolam (Xanax), lorazepam (Ativan), zolpidem (Ambien), pregabalin (Lyrica), testosterone.      5.  How do I get after-hours care?    UEAVWUJW: We strive to offer same-day appointments in our office Monday-Friday, 8am-5pm. Call during these hours to request an urgent appointment: [phone].     Evenings/weekends: Our colleagues offer access to after-hours care at our walk-in clinic:    [facility]  [address], [place]   [phone]  Monday - Friday: 10:00 am to 9:00 pm  Saturday - Sunday: 9:00 am to 5:00 pm  Laboratory and x-ray services available [facility] has additional urgent care sites, see locations at: [email or web address]    Paging: If you need to have your doctor (or the on-call doctor) paged through our emergency line, call [phone].    For serious and life-threatening concerns you should call 911.    Emergency Services    [facility]   Emergency Department  [address]  [place], [place] [address]  Hospital Information: [phone]  Emergency Department: [phone]    [facility]   Emergency Department  [address]  [place], [place] [address]  Hospital Information: [phone]  Emergency Department: [phone]      Future Appointments   Date Time Provider Department Center   04/11/2019 10:45 AM Maxie Barb., MD [facility] [phone] MEDICINE   05/12/2019 10:30 AM Pregler, Weyman Croon., MD [facility] [phone] MEDICINE     The above plan of care, diagnosis, order, and follow-up were discussed with the patient. The plan was formulated with joint decision-making with the patient, and the patient is in agreement with the plan of care and follow up.   Staffed with attending physician Dr. Jimmie Molly

## 2019-04-12 LAB — COVID-19 and Influenza A/B PCR: INFLUENZA B PCR: NOT DETECTED

## 2019-04-13 ENCOUNTER — Ambulatory Visit: Payer: MEDICARE

## 2019-04-18 ENCOUNTER — Telehealth: Payer: BC Managed Care – POS

## 2019-04-18 DIAGNOSIS — Z20822 Close exposure to COVID-19 virus: Secondary | ICD-10-CM

## 2019-04-18 NOTE — Nursing Note
Nursing Note: Video Visit Rooming    ? Rooming time started at: 1:35 PM  ? Called Caroline Longie at [phone] to ensure that she is ready for today's video visit appoinment.   ? Visit info, vital signs (only if patient has BP machine / pulse ox / thermometer), allergies, medications, refills, pharmacy have been entered/verified in CC.  ? Patient DOES NOT have BP machine / pulse oximeter / thermometer at home.  ? Patient was instructed to join the Video Visit 5-10 minutes before the appointment.  ? Patient verbalized understanding of given information  ? MyChart Support Number: [phone] provided for technical issues.          Chief Complaint   Patient presents with   ? ILI/PUI     exposed to family member     Vitals:    04/18/19 1336   Weight: 124 lb (56.2 kg)   Height: 5' 4'' (1.626 m)     Pain Information (Last Filed)     Score Location Comments Edu?    0-No pain None None None        COVID-19 Screening     Patients presenting with COVID-19 symptoms and exposure will be asked to convert to telemedicine.     Are you a [employer] employee?    []  If yes, please call [phone]   [x]  No, not a [employer] employee      1. Have you had any of the following in the past 20 days, not due to chronic condition:     []  Asymptomatic    Fever >=100.74F / 37.8C)   []  Yes   [x]  No   Cough   []  Yes   [x]  No   Shortness of breath or Difficulty Breathing   []  Yes   [x]  No   Fatigue   []  Yes   [x]  No   Muscle or body aches    []  Yes   [x]  No   Headache    []  Yes   [x]  No   New loss of taste or smell   []  Yes   [x]  No   Sore Throat    []  Yes   [x]  No   Nasal congestion    []  Yes   [x]  No   Runny nose   []  Yes   [x]  No   Nausea    []  Yes   [x]  No   Vomiting   []  Yes   [x]  No   Diarrhea   []  Yes   [x]  No     2. Have you been in close contact with anyone diagnosed or suspected of having COVID-19 in the past 20 days?    [x]  Yes   []  No    3. Have you or a member of your household been tested for COVID-19 in the last 20 days? [x]  Yes []  No  A. If yes, was any of the testing positive for COVID-19?  []  Yes [x]  No    Priscille Heidelberg, LVN  04/18/2019 ~ 1:39 PM

## 2019-04-18 NOTE — Progress Notes
Outpatient Follow Up Note    PATIENT: Robin Spencer  MRN: [id number]  DOB: 11-Apr-1950  DATE OF SERVICE: 04/18/2019    CHIEF COMPLAINT:   Chief Complaint   Patient presents with   ? ILI/PUI     exposed to family member   ? ILI/PUI      Subjective:    HPI: Robin Spencer is a 69 y.o. female   [place] visit    Pcp: Dr. Marylu Lund Pregler  Exposed to mom and caregiver who have covid. Last exposure 04/09/19.   Pt wore mask when visiting her mother Day 10. Of quarantine    pt denies any covid  symptoms.  Had covid test  1/25: negative     Requesting another covid test.     Had covid vaccine: 04/06/19 1/2  Chronic Conditions:  Patient Active Problem List   Diagnosis   ? Osteoporosis   ? Eye allergies   ? Genital HSV   ? PPD positive   ? Palpitations   ? OA (osteoarthritis)   ? Visit for preventive health examination   ? Hearing loss, right   ? Seborrheic dermatitis   ? Menopause   ? BPPV (benign paroxysmal positional vertigo)   ? Fibroid   ? Cataract, nuclear sclerotic, both eyes   ? Status post LASIK surgery of both eyes   ? Breast pain   ? Invasive ductal carcinoma of left breast, ER+PR+HER2-   ? Immune to measles   ? Syncope and collapse   ? Increased PTH level   ? Vitreous floater, bilateral   ? Asymptomatic varicose veins   ? Rash, Intermittent, noted after colds   ? Fibromyalgia   ? Mucous cyst of digit of left hand       MEDS:  Current Outpatient Medications   Medication Sig   ? ACYCLOVIR 400 mg tablet TAKE 1 TABLET (400 MG TOTAL) BY MOUTH TWO (2) TIMES DAILY.   ? BIOTIN PO Take 1 tablet by mouth daily .     ? CALCIUM CITRATE PO Take 630 mg by mouth daily.   ? Cholecalciferol (VITAMIN D3) 5000 units CAPS Take 1 capsule by mouth daily.   ? cyanocobalamin 500 mcg tablet Take 500 mcg by mouth daily.   ? cyclobenzaprine 5 mg tablet Take 1 tablet (5 mg total) by mouth at bedtime. (Patient taking differently: Take 2.5 mg by mouth at bedtime .)   ? Homeopathic Products (TRAUMEEL) OINT Apply 4 g topically daily. ? hydrocortisone 25 mg suppository Place 1 suppository (25 mg total) rectally every twelve (12) hours.   ? MAGNESIUM CITRATE PO Take 150 mg by mouth two (2) times daily.   ? Omega-3 Fatty Acids (FISH OIL PO) Take 1,400 mg by mouth two (2) times daily.   ? tamoxifen 20 mg tablet Take 1 tablet (20 mg total) by mouth daily.   ? triamcinolone 0.5% cream Apply topically as needed for.   ? UNABLE TO FIND Med Name: [place] supplement- Epimedium, Tumeric, Japanese, Fleeceflower, EPA/DHA .   ? YUVAFEM 10 MCG vaginal tablet PLACE 1 TABLET (10 MCG TOTAL) VAGINALLY TWO (2) TIMES A WEEK.   ? oxybutynin 5 mg tablet Take 1 tablet (5 mg total) by mouth two (2) times daily. (Patient not taking: Reported on 04/07/2019.)   ? oxyCODONE 5 mg tablet Take 1 tablet (5 mg total) by mouth every six (6) hours as needed. Max Daily Amount: 20 mg (Patient not taking: Reported on 11/30/2018.)     No  current facility-administered medications for this visit.        ALL:  Allergies   Allergen Reactions   ? Penicillins Other (See Comments) and Rash     Ampicillin, flesh rash in 1980s   ? Menadiol Sodium Diphosphate Rash   ? Other      Red [place] coloring on meds such as advil- not allergic to actual med- just the coloring   ? Acetaminophen      Denies this allergy       EXAM:  Ht 5' 4'' (1.626 m)  ~ Wt 124 lb (56.2 kg)  ~ LMP  (LMP Unknown)  ~ BMI 21.28 kg/m?   Body mass index is 21.28 kg/m?Marland Kitchen  .        Physical Exam:     (The following were examined and were normal unless noted otherwise)  Gen  [x]  Appearance  []  Alert    Eyes  []  Conjunctiva  []  Pupils  []  Sclera   ENT  []  Oropharynx  []  Nasal     Resp  [x]  Effort     Skin  [x]  Color  []  No rashes    Neuro  [x]  Oriented  [x]   Speech  []  Movement        Assessment & Plan:        Diagnoses and all orders for this visit:    Suspected COVID-19 virus infection  -     COVID-19 and Influenza - PCR, (Supv) Mid-turbinate; Future    Close exposure to COVID-19 virus will remain in quarantine until test results. Can theoretically get covid infection despite receiving first vaccine    Follow up:  Return precautions given    The above recommendation were discussed with the patient.  The patient has all questions answered satisfactorily and is in agreement with this recommended plan of care.      Revonda Humphrey, MD  Clinical Professor of Medicine  [employer] at [facility]

## 2019-04-21 ENCOUNTER — Institutional Professional Consult (permissible substitution): Payer: BLUE CROSS/BLUE SHIELD

## 2019-04-21 ENCOUNTER — Telehealth: Payer: MEDICARE

## 2019-04-21 DIAGNOSIS — Z20822 Suspected COVID-19 virus infection: Secondary | ICD-10-CM

## 2019-04-21 NOTE — Telephone Encounter
Medication Prior Authorization Status    1. Medication:  Hydrocortisone 25 mg suppository     2. Pharmacy limitation:  A.   []  Not on formulary  Alternative:  No listed alternatives  MD please order medication if alternative is chosen.  Please route to nursing to notify patient of medication change that was approved by physician.    B.  [x]  Not a covered benefit    [x] MD advise if PA should be submitted:   []  yes   [] no     [x]  Please confirm diagnosis:      C.  []  Quantity limitation:   Alternative dosing:    Patient is presently taking:  Insurance covers the following:  Physician please advise do you want to initiate PA for dose change?  [] yes  [] no    Or   Physician medication recommendation- change dosing to the following in meds and orders and forward to nursing to contact patient.      :________  Ermalene Searing

## 2019-04-21 NOTE — Telephone Encounter
Please request prior auth for symptomatic hemorrhoids

## 2019-04-22 ENCOUNTER — Ambulatory Visit: Payer: BC Managed Care – POS

## 2019-04-22 LAB — COVID-19 and Influenza A/B PCR: COVID-19 PCR: NOT DETECTED

## 2019-04-22 NOTE — Telephone Encounter
Prior authorization was submitted on 04/22/19    KEY: [id number]    Please allow 24 to 48 hours for processing once response is received update will be provided.    [place]

## 2019-04-27 ENCOUNTER — Ambulatory Visit: Payer: BC Managed Care – POS

## 2019-04-27 ENCOUNTER — Telehealth: Payer: BLUE CROSS/BLUE SHIELD

## 2019-04-27 DIAGNOSIS — R0602 Shortness of breath: Secondary | ICD-10-CM

## 2019-04-27 DIAGNOSIS — Z20822 Suspected COVID-19 virus infection: Secondary | ICD-10-CM

## 2019-04-27 DIAGNOSIS — Z634 Disappearance and death of family member: Secondary | ICD-10-CM

## 2019-04-28 ENCOUNTER — Inpatient Hospital Stay: Payer: BC Managed Care – POS

## 2019-04-28 LAB — COVID-19 and Influenza A/B PCR: COVID-19 PCR: NOT DETECTED

## 2019-04-28 NOTE — Patient Instructions
Shortness of Breath (Dyspnea)  Shortness of breath is the feeling that you can't catch your breath or get enough air. It is also known as dyspnea.  Dyspnea can be caused by many different conditions. They include:   Acute asthma attack   Worsening of chronic lung diseases such as chronic bronchitis and emphysema   Heart failure. This is when weak heart muscle allows extra fluid to collect in the lungs.   Panic attacks or anxiety. Fear can cause rapid breathing (hyperventilation).   Pneumonia, or an infection in the lung tissue   Exposure to toxic substances, fumes, smoke, or certain medicines   Blood clot in the lung (pulmonary embolism). This is often from a piece of blood clot in a deep vein of the leg (deep vein thrombosis) that breaks off and travels to the lungs.   Heart attack or heart-related chest pain (angina)   Anemia   Collapsed lung (pneumothorax)   Dehydration   Pregnancy  Based on your visit today, the exact cause of your shortness of breath is not certain. Your tests don't show any of the serious causes of dyspnea. You may need other tests to find out if you have a serious problem. It's important to watch for any new symptoms or symptoms that get worse. Follow up with your healthcare provider as directed.  Home care  Follow these tips to take care of yourself at home:   When your symptoms are better, go back to your usual activities.   If you smoke, you should stop. Join a quit-smoking program or ask your healthcare provider for help.   Eat a healthy diet and get plenty of sleep.   Get regular exercise. Talk with your healthcare provider before starting to exercise, especially if you have other medical problems.   Cut down on the amount of caffeine and stimulants you consume.  Follow-up care  Follow up with your healthcare provider, or as advised.  If tests were done, you will be told if your treatment needs to be changed. You can call as directed for the results.  If an X-ray was  taken, a specialist will review it. You will be notified of any new findings that may affect your care.  Call 911  Shortness of breath may be a sign of a serious medical problem. For example, it may be a problem with your heart or lungs. Call 911 if you have worsening shortness of breath or trouble breathing, especially with any of the symptoms below:   Confusion or difficulty waking   Fainting or loss of consciousness.   Fast or irregular heartbeat   Coughing up blood   Pain in your chest, arm, shoulder, neck, or upper back   Sweating  When to seek medical advice  Call your healthcare provider right away if any of these occur:   Slight shortness of breath or wheezing   Redness, pain or swelling in your leg, arm, or other body area   Swelling in both legs or ankles   Fast weight gain   Dizziness or weakness   Fever of 100.4F (38C) or higher, or as directed by your healthcare provider  [employer] last reviewed this educational content on 08/15/2016   2000-2020 [employer]. All rights reserved. This information is not intended as a substitute for professional medical care. Always follow your healthcare professional's instructions.

## 2019-04-28 NOTE — Progress Notes
[facility] INTERNAL MEDICINE AND PEDIATRICS  [facility]  [address]  [place] [address]  Phone: [phone]  Fax: [phone]    URGENT CARE TELEMEDICINE VIDEO VISIT          Patient: Brienne Liguori  MRN: [id number]  Date of Service: 04/27/2019  Primary Care Physician: Pregler, Weyman Croon., MD  This visit was conducted via telemedicine (video) during the COVID pandemic.    Patient Consent to Telehealth Questionnaire   [facility] TELEHEALTH PRECHECKIN QUESTIONS 04/27/2019   By clicking ''I Agree'', I consent to the below:  I Agree     - I agree  to be treated via a video visit and acknowledge that I may be liable for any relevant copays or coinsurance depending on my insurance plan.  - I understand that this video visit is offered for my convenience and I am able to cancel and reschedule for an in-person appointment if I desire.  - I also acknowledge that sensitive medical information may be discussed during this video visit appointment and that it is my responsibility to locate myself in a location that ensures privacy to my own level of comfort.  - I also acknowledge that I should not be participating in a video visit in a way that could cause danger to myself or to those around me (such as driving or walking).  If my provider is concerned about my safety, I understand that they have the right to terminate the visit.       Chief Complaint:      Concern for COVID-19 and associated symptoms    History of Present Illness:   Kamaya Keckler is a 69 y.o. female with FM and listed PMH presenting with concern for COVID-19 and associated symptoms.  Requesting Covid test.    []  Cough  []  Fever  [x]  Shortness of Breath intermittent, while on way back today from [place] (after picking up mom's belongings), feels cannot take full breath.  []  GI symptoms   []  Sore throat  []  Bodyaches  []  Muscle pain  []  Fatigue  []  Chills  []  Headache  []  Loss of smell or taste  []  Nasal congestion []  Nausea, vomiting  []  Rash  []  Other:   No cp or palpitations.  Hx of RAD with trigger of pollen, cat or URI, but does not feel like from RAD.  spO2 99-100% now.    Sick contact: exposed on Jan 23rd, pt and spouse tested negative on 1/25 and 2/4.  Mother and caregiver symptomatic Covid that weekend.  Mom passed away from Covid 5d ago.    Review of Systems:     Pertinent positives as noted in HPI.   Negative for fever/cough/cp/weakness.     History:     No current facility-administered medications for this visit.       Allergies   Allergen Reactions   ? Penicillins Other (See Comments) and Rash     Ampicillin, flesh rash in 1980s   ? Menadiol Sodium Diphosphate Rash   ? Other      Red [place] coloring on meds such as advil- not allergic to actual med- just the coloring   ? Acetaminophen      Denies this allergy     Patient Active Problem List   Diagnosis   ? Osteoporosis   ? Eye allergies   ? Genital HSV   ? PPD positive   ? Palpitations   ? OA (osteoarthritis)   ?  Visit for preventive health examination   ? Hearing loss, right   ? Seborrheic dermatitis   ? Menopause   ? BPPV (benign paroxysmal positional vertigo)   ? Fibroid   ? Cataract, nuclear sclerotic, both eyes   ? Status post LASIK surgery of both eyes   ? Breast pain   ? Invasive ductal carcinoma of left breast, ER+PR+HER2-   ? Immune to measles   ? Syncope and collapse   ? Increased PTH level   ? Vitreous floater, bilateral   ? Asymptomatic varicose veins   ? Rash, Intermittent, noted after colds   ? Fibromyalgia   ? Mucous cyst of digit of left hand     Past Medical History:   Diagnosis Date   ? Anxiety    ? Breast cancer (HCC/RAF)    ? Eczema    ? Fibroids    ? GERD (gastroesophageal reflux disease)     esophagitis   ? History of migraine headaches    ? History of radiation therapy 04/2016    L breast   ? Otosclerosis    ? Post-operative nausea and vomiting    ? Rectal fissure    ? Vitreous detachment       Past Surgical History: Procedure Laterality Date   ? BREAST BIOPSY  1986    Fibroadenoma, ductal hyperplasia   ? Laparoscopy for subfertility     ? Left finger cyst removal x 2     ? Stapedectomy, right          Past medical history, past surgical history, family history, and social history all reviewed, updated, and noted in HPI if significant.     Physical Exam:     (The following were observed over video and were normal if checked unless noted otherwise)  Gen  [x]  Appearance  [x]  Alert    Eyes  [x]  Conjunctiva  [x]  Sclera     ENT  []  Oropharynx  [x]  Nasal  [x]  Voice   Resp  [x]  Effort  [x]  Cough absent    Skin  [x]  Color  [x]  No rash    Neuro  [x]  Oriented  [x]   Speech  [x]  Movement     Pertinent + findings (if any):        Assessment & Plan:     1. SOB (shortness of breath)  Pt requesting clinic visit for EKG, CXR, and Covid test.  She will come in 1hr.      Philis Fendt, MD   Urgent Care/Family Medicine  (electronically signed by Thereasa Parkin)

## 2019-04-28 NOTE — Progress Notes
[facility] INTERNAL MEDICINE AND PEDIATRICS  [facility]  [address]  [place] [address]  Phone: [phone]  Fax: [phone]    URGENT CARE VISIT          Name: Robin Spencer  Medical Record Number: [id number]  Date of Service: 04/27/2019      Chief Complaint and Reason For Visit:      Chief Complaint   Patient presents with   ? Shortness of Breath     Patient presented to this facility independently.    History of Present Illness:      Robin Spencer is a 69 y.o. female  with listed PMH here c/o:  Covid testing.  Mom passed away from Covid 5d ago.  Last exposed on Jan 23rd, mother and caregiver symptomatic Covid that weekend.  pt and spouse tested negative on 1/25 and 2/4.    [x] ? Shortness of Breath intermittent, while [place] way [place] today from [place] (after picking up mom's belongings), feels cannot take full breath..  No cp or palpitations.  Hx of RAD with trigger of pollen, cat or URI, but does not feel like from RAD.  spO2 99-100% at home.  No fever, cough, sore throat, bodyaches, nasal congestion, GI symptoms.  No leg swelling or pain.  No recent prolonged immobilization/surgery/hx of thrombosis.     Review of Systems:     Pertinent positives as noted in HPI.   Negative as above.    Past History:      Patient Active Problem List   Diagnosis   ? Osteoporosis   ? Eye allergies   ? Genital HSV   ? PPD positive   ? Palpitations   ? OA (osteoarthritis)   ? Visit for preventive health examination   ? Hearing loss, right   ? Seborrheic dermatitis   ? Menopause   ? BPPV (benign paroxysmal positional vertigo)   ? Fibroid   ? Cataract, nuclear sclerotic, both eyes   ? Status post LASIK surgery of both eyes   ? Breast pain   ? Invasive ductal carcinoma of left breast, ER+PR+HER2-   ? Immune to measles   ? Syncope and collapse   ? Increased PTH level   ? Vitreous floater, bilateral   ? Asymptomatic varicose veins   ? Rash, Intermittent, noted after colds   ? Fibromyalgia ? Mucous cyst of digit of left hand       Past Medical History:   Diagnosis Date   ? Anxiety    ? Breast cancer (HCC/RAF)    ? Eczema    ? Fibroids    ? GERD (gastroesophageal reflux disease)     esophagitis   ? History of migraine headaches    ? History of radiation therapy 04/2016    L breast   ? Otosclerosis    ? Post-operative nausea and vomiting    ? Rectal fissure    ? Vitreous detachment          Social History:      Past Surgical History:   Procedure Laterality Date   ? BREAST BIOPSY  1986    Fibroadenoma, ductal hyperplasia   ? Laparoscopy for subfertility     ? Left finger cyst removal x 2     ? Stapedectomy, right         Social History     Socioeconomic History   ? Marital status: Married     Spouse name: Not on  file   ? Number of children: Not on file   ? Years of education: Not on file   ? Highest education level: Not on file   Occupational History   ? Not on file   Social Needs   ? Financial resource strain: Not on file   ? Food insecurity     Worry: Not on file     Inability: Not on file   ? Transportation needs     Medical: Not on file     Non-medical: Not on file   Tobacco Use   ? Smoking status: Never Smoker   ? Smokeless tobacco: Never Used   Substance and Sexual Activity   ? Alcohol use: No     Alcohol/week: 0.0 oz   ? Drug use: No   ? Sexual activity: Yes     Partners: Male     Birth control/protection: None   Lifestyle   ? Physical activity     Days per week: Not on file     Minutes per session: Not on file   ? Stress: Not on file   Relationships   ? Social Wellsite geologist on phone: Not on file     Gets together: Not on file     Attends religious service: Not on file     Active member of club or organization: Not on file     Attends meetings of clubs or organizations: Not on file     Relationship status: Not on file   Other Topics Concern   ? Not on file   Social History Narrative    Patient is married, and has two children. 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  oxyCODONE 5 mg tablet, Take 1 tablet (5 mg total) by mouth every six (6) hours as needed. Max Daily Amount: 20 mg (Patient not taking: Reported on 11/30/2018.), Disp: 10 tablet, Rfl: 0        Current Outpatient Medications (GI Meds):   ?  MAGNESIUM CITRATE PO*, Take 150 mg by mouth two (2) times daily., Disp: , Rfl:     Current Outpatient Medications (Nutrition):   ?  BIOTIN PO, Take 1 tablet by mouth daily . , Disp: , Rfl:   ?  CALCIUM CITRATE PO, Take 630 mg by mouth daily., Disp: , Rfl:   ?  Cholecalciferol (VITAMIN D3) 5000 units CAPS, Take 1 capsule by mouth daily., Disp: , Rfl:   ?  MAGNESIUM CITRATE PO*, Take 150 mg by mouth two (2) times daily., Disp: , Rfl:   ?  Omega-3 Fatty Acids (FISH OIL PO), Take 1,400 mg by mouth two (2) times daily., Disp: , Rfl:     Current Outpatient Medications (GU Meds):   ?  YUVAFEM 10 MCG vaginal tablet, PLACE 1 TABLET (10 MCG TOTAL) VAGINALLY TWO (2) TIMES A WEEK., Disp: 24 tablet, Rfl: 1  ?  oxybutynin 5 mg tablet, Take 1 tablet (5 mg total) by mouth two (2) times daily. (Patient not taking: Reported on 04/07/2019.), Disp: 60 tablet, Rfl: 11    Current Outpatient Medications (Heme Meds):   ?  cyanocobalamin 500 mcg tablet, Take 500 mcg by mouth daily., Disp: , Rfl:     Current Outpatient Medications (Chemotherapy):   ?  tamoxifen 20 mg tablet, Take 1 tablet (20 mg total) by mouth daily., Disp: 90 tablet, Rfl: 3    Current Outpatient Medications (Antibiotics):   ?  ACYCLOVIR 400 mg tablet, TAKE 1 TABLET (400 MG TOTAL) BY MOUTH TWO (2) TIMES DAILY., Disp: 180 tablet, Rfl: 1        Current Outpatient Medications (Derm Meds): ?  hydrocortisone 25 mg suppository, Place 1 suppository (25 mg total) rectally every twelve (12) hours. (Patient not taking: Reported on 04/27/2019.), Disp: 12 suppository, Rfl: 1  ?  triamcinolone 0.5% cream, Apply topically as needed for. (Patient not taking: Reported on 04/27/2019.), Disp: 1 tube, Rfl: 1    Current Outpatient Medications (Miscellaneous):   ?  Homeopathic Products (TRAUMEEL) OINT, Apply 4 g topically daily. (Patient not taking: Reported on 04/27/2019.), Disp: 50 g, Rfl: 3    Current Outpatient Medications (Other):   ?  cyclobenzaprine 5 mg tablet, Take 1 tablet (5 mg total) by mouth at bedtime. (Patient taking differently: Take 2.5 mg by mouth at bedtime .), Disp: 30 tablet, Rfl: 5  ?  UNABLE TO FIND, Med Name: [place] supplement- Epimedium, Tumeric, Japanese, Fleeceflower, EPA/DHA ., Disp: , Rfl:   * These medications belong to multiple therapeutic classes and are listed under each applicable group.    Allergies as of 04/27/2019 - Review Complete 04/27/2019   Allergen Reaction Noted   ? Penicillins Other (See Comments) and Rash 01/08/2012   ? Menadiol sodium diphosphate Rash 03/19/2016   ? Other  02/29/2016   ? Acetaminophen  03/02/2016        Vitals:   Vitals:    04/27/19 1836   BP: 133/82   Pulse: 79   Resp: 15   Temp: 36.8 ?C (98.3 ?F)   TempSrc: Oral   SpO2: 97%       Physical Exam:      GEN: alert, no acute distress.  HEAD FACE: normal appearance.  EYES: normal sclerae and conjunctivae.  OROPHARYNX: normal tonsils and pharynx.  NECK: supple, normal ROM without tenderness.  CARDIOVASCULAR: normal heart rate and rhythm, no murmur.  LUNGS: clear to auscultation bilaterally.  Normal respiratory effort.  MUSCULOSKELETAL: normal tone and strength, normal ROM without tenderness, No edema or calf tenderness.  SKIN: no rash observed.  NEURO: symmetric facial movements/normal strength, the sensory exam was normal to light touch, the motor exam was normal 5/5 all exts, normal gait and station. Labs and Imaging:   Medical Decision Making  In addition to the above HPI information I have:  [x]  Reviewed/ordered []  1 []  2 [x]  ? 3 unique laboratory, radiology, and/or diagnostic tests noted below    []  Reviewed []  1 []  2 []  ? 3 prior external notes and incorporated into patient assessment    []  Discussed management or test interpretation with external provider(s) as noted      In addition to the above HPI information,  Imaging Reviewed:  CXR: no infiltrate, consolidation, effusion or pneumothorax seen. (preliminary read by this provider.)  PE Wells score: 0  EKG interpreted:  EKG:  Normal sinus rhythm. No significant ST-T wave changes or change to prior.  (interpretated by this provider.)      Assessment and Plan:      1. Mild shortness of breath  - suspect bereavement reaction  - Covid test to r/o as requested by  pt  - no cardiac symptoms, hx of PACs reported  - no RAD signs on exam, neg CXR today  - to monitor     2. Recent bereavement  - pt has supportive spouse and family    3. Suspected COVID-19 virus infection  - COVID-19 and Influenza - PCR, Nasopharyngeal    To follow with pcp within 1wk or urgent care sooner if no improvement, patient agreed with plan.      Discussed  Discharge: patient was discharged in stable condition.  Any test or lab results (if ordered) will released to pt portal or by phone, to call back if not contacted within expected time.  ER precautions education, to ER if worse symptoms discussed.  Instructions: The above diagnosis, plan of care, orders, labs, including medications if given were explained and given to patient with acknowledgement and demonstrated understanding, with all questions answered to satisfaction.  See AVS for additional information and counseling materials if provided to the patient.    Level of medical decision making based on the evaluation today:  Problem(s) addressed   []  minor/self-limited problem []  acute uncomplicated illness/injury, or 1 stable chronic illness, or 2 or more self-limited/minor problems  [x]  acute systemic illness or complicated injury or 1 new undiagnosed problem with uncertain prognosis or > 2 stable chronic illness or >1 chronic illness with exacerbation/progression/se of tx  []  acute or chronic illness/injury that poses threat to life or bodily function or > 1 chronic illness with severe exacerbation/progression/or  se of tx    Risk of complication   []  Minimal  []  Low  [x]  Moderate      Roxanne Panek, MD   Urgent Care/Family Medicine  (electronically signed by Thereasa Parkin)

## 2019-05-04 ENCOUNTER — Ambulatory Visit: Payer: BC Managed Care – POS

## 2019-05-04 DIAGNOSIS — S99921A Unspecified injury of right foot, initial encounter: Secondary | ICD-10-CM

## 2019-05-04 DIAGNOSIS — S92424A Nondisplaced fracture of distal phalanx of right great toe, initial encounter for closed fracture: Secondary | ICD-10-CM

## 2019-05-05 ENCOUNTER — Inpatient Hospital Stay: Payer: BLUE CROSS/BLUE SHIELD

## 2019-05-05 NOTE — Progress Notes
[facility] INTERNAL MEDICINE & PEDIATRICS  [facility]  [address]  [place] [address]  Phone: [phone]  FAX: [phone]  [phone]    PROGRESS NOTE    Subjective:     CC: Foot Pain (Pt reports she injured Right big toe 05/03/19, severe pain with movement of walking)    HPI:  Robin Spencer is a 69 y.o. female who presents for Foot Pain (Pt reports she injured Right big toe 05/03/19, severe pain with movement of walking)     #Right toe injury  Was walking her dogs with her husband  Tripped on a crack in the sidewalk, was wearing slip-ons  Larey Seat into her husband but she stubbed her toe pretty bad  Got second COVID shot this morning  Happened around 9:30pm  Walked a little bit but painful, husband came with car to pick her up  Elevated foot with ice for about an hour, then went to sleep  Took Tylenol, none since last night  Dull throbbing pain    Problem List:  Patient Active Problem List   Diagnosis   ? Osteoporosis   ? Eye allergies   ? Genital HSV   ? PPD positive   ? Palpitations   ? OA (osteoarthritis)   ? Visit for preventive health examination   ? Hearing loss, right   ? Seborrheic dermatitis   ? Menopause   ? BPPV (benign paroxysmal positional vertigo)   ? Fibroid   ? Cataract, nuclear sclerotic, both eyes   ? Status post LASIK surgery of both eyes   ? Breast pain   ? Invasive ductal carcinoma of left breast, ER+PR+HER2-   ? Immune to measles   ? Syncope and collapse   ? Increased PTH level   ? Vitreous floater, bilateral   ? Asymptomatic varicose veins   ? Rash, Intermittent, noted after colds   ? Fibromyalgia   ? Mucous cyst of digit of left hand       Medications/Supplements  Medications that the patient states to be currently taking   Medication Sig   ? ACYCLOVIR 400 mg tablet TAKE 1 TABLET (400 MG TOTAL) BY MOUTH TWO (2) TIMES DAILY.   ? BIOTIN PO Take 1 tablet by mouth daily .     ? CALCIUM CITRATE PO Take 630 mg by mouth daily. ? Cholecalciferol (VITAMIN D3) 5000 units CAPS Take 1 capsule by mouth daily.   ? cyanocobalamin 500 mcg tablet Take 500 mcg by mouth daily.   ? cyclobenzaprine 5 mg tablet Take 1 tablet (5 mg total) by mouth at bedtime. (Patient taking differently: Take 2.5 mg by mouth at bedtime .)   ? Homeopathic Products (TRAUMEEL) OINT Apply 4 g topically daily.   ? MAGNESIUM CITRATE PO Take 150 mg by mouth two (2) times daily.   ? Omega-3 Fatty Acids (FISH OIL PO) Take 1,400 mg by mouth two (2) times daily.   ? tamoxifen 20 mg tablet Take 1 tablet (20 mg total) by mouth daily.   ? triamcinolone 0.5% cream Apply topically as needed for.   ? UNABLE TO FIND Med Name: [place] supplement- Epimedium, Tumeric, Japanese, Fleeceflower, EPA/DHA .   ? YUVAFEM 10 MCG vaginal tablet PLACE 1 TABLET (10 MCG TOTAL) VAGINALLY TWO (2) TIMES A WEEK.       Allergies:  Penicillins, Menadiol sodium diphosphate, Other, and Acetaminophen    Objective:     Last Recorded Vital Signs:  05/04/19 1536   BP: 125/78   Pulse: 79   Resp: 18   Temp: 37.1 ?C (98.8 ?F)   SpO2: 98%     Body mass index is 21.46 kg/m?Marland Kitchen    Physical Exam  System Check if normal Positive or additional negative findings   Constit  [x]  General appearance     Eyes  [x]  Conj/Lids []  Pupils  []  Fundi     ENMT  []  External ears/nose []  Otoscopy   [x]  gross Hearing []  Nasal mucosa   []  Lips/teeth/gums []  Oropharynx    []  mucus membranes     Neck  []  Inspection/palpation []  Thyroid     Resp  [x]  Normal effort []  No Wheezing    []  Auscultation  [] No Crackles     CV  []  Rhythm/rate   []  Murmurs   []  LEE   []  JVP non-elevated    Normal pulses:   []  radial []  Femoral  []  Pedal     Breast  []  Inspection []  Palpation     GI  []  abd masses    []  tenderness   []  rebound/guarding   []  Liver/spleen []  Rectal     GU  M: []  Scrotum []  Penis []  Prostate   F:  []  External []  vaginal wall        []  Cervix  []  mucus        []  Uterus    []  Adnexa      Lymph  []  Neck []  Axillae []  Groin MSK Specify site examined:    []  Inspect/palp []  ROM   []  Stability []  Strength/tone         Skin  []  Inspection []  Palpation     Neuro  []  CN2-12 intact grossly   [x]  Alert and oriented   []  DTR      []  Muscle strength      []  Sensation   [x]  Gait/balance     Psych  [x]  Insight/judgement     [x]  Mood/affect    [x]  Gross cognition          I have:   []  Reviewed/ordered []  1 []  2 []  ? 3 unique laboratory, radiology, and/or diagnostic tests noted below    []  Reviewed []  1 []  2 []  ? 3 prior external notes and incorporated into patient assessment    []  Discussed management or test interpretation with external provider(s) as noted    Lab Studies:  {Labs Reviewed BJYNW:29562}    Imaging Studies:  {Images Reviewed:33791}    Assessment/Plan:     Robin Spencer is a a 69 y.o. female presenting for   Chief Complaint   Patient presents with   ? Foot Pain     Pt reports she injured Right big toe 05/03/19, severe pain with movement of walking         1. Closed nondisplaced fracture of distal phalanx of right great toe, initial encounter    2. Injury of toe on right foot, initial encounter        ***    Orders Placed This Encounter   ? Referral to Podiatry       No follow-ups on file.    Author:  Adelina Mings I. Thomas-Boisvert, MD 05/04/2019 5:44 PM    The above plan of care, diagnosis, order, and follow-up were discussed with the patient. Questions related to this recommended plan of care were answered.

## 2019-05-05 NOTE — Patient Instructions
[place]  Preston Forman  [place]

## 2019-05-09 ENCOUNTER — Ambulatory Visit: Payer: BC Managed Care – POS

## 2019-05-10 ENCOUNTER — Ambulatory Visit: Payer: BLUE CROSS/BLUE SHIELD

## 2019-05-10 NOTE — Progress Notes
Patient is a pleasant 69 year old woman who presents to the office approximately 1 weeks post fracture of her right great toe patient states she stumbled and twisted the toe.  Patient states she had x-rays taken at [facility] which told her she was fractured patient is currently using a postoperative wooden shoe which she states helps but is difficult for her to walk in.  Patient looking for an additional opinion as well as additional treatment.  Patient Active Problem List   Diagnosis   ? Osteoporosis   ? Eye allergies   ? Genital HSV   ? PPD positive   ? Palpitations   ? OA (osteoarthritis)   ? Visit for preventive health examination   ? Hearing loss, right   ? Seborrheic dermatitis   ? Menopause   ? BPPV (benign paroxysmal positional vertigo)   ? Fibroid   ? Cataract, nuclear sclerotic, both eyes   ? Status post LASIK surgery of both eyes   ? Breast pain   ? Invasive ductal carcinoma of left breast, ER+PR+HER2-   ? Immune to measles   ? Syncope and collapse   ? Increased PTH level   ? Vitreous floater, bilateral   ? Asymptomatic varicose veins   ? Rash, Intermittent, noted after colds   ? Fibromyalgia   ? Mucous cyst of digit of left hand   ? Other fracture of right great toe, initial encounter for closed fracture     Outpatient Medications Prior to Visit   Medication Sig   ? ACYCLOVIR 400 mg tablet TAKE 1 TABLET (400 MG TOTAL) BY MOUTH TWO (2) TIMES DAILY.   ? BIOTIN PO Take 1 tablet by mouth daily .     ? CALCIUM CITRATE PO Take 630 mg by mouth daily.   ? Cholecalciferol (VITAMIN D3) 5000 units CAPS Take 1 capsule by mouth daily.   ? cyanocobalamin 500 mcg tablet Take 500 mcg by mouth daily.   ? cyclobenzaprine 5 mg tablet Take 1 tablet (5 mg total) by mouth at bedtime. (Patient taking differently: Take 2.5 mg by mouth at bedtime .)   ? Homeopathic Products (TRAUMEEL) OINT Apply 4 g topically daily. ? hydrocortisone 25 mg suppository Place 1 suppository (25 mg total) rectally every twelve (12) hours. (Patient not taking: Reported on 04/27/2019.)   ? MAGNESIUM CITRATE PO Take 150 mg by mouth two (2) times daily.   ? Omega-3 Fatty Acids (FISH OIL PO) Take 1,400 mg by mouth two (2) times daily.   ? oxybutynin 5 mg tablet Take 1 tablet (5 mg total) by mouth two (2) times daily. (Patient not taking: Reported on 04/07/2019.)   ? oxyCODONE 5 mg tablet Take 1 tablet (5 mg total) by mouth every six (6) hours as needed. Max Daily Amount: 20 mg (Patient not taking: Reported on 11/30/2018.)   ? tamoxifen 20 mg tablet Take 1 tablet (20 mg total) by mouth daily.   ? triamcinolone 0.5% cream Apply topically as needed for.   ? UNABLE TO FIND Med Name: Promed[place] supplement- Epimedium, Tumeric, Japanese, Fleeceflower, EPA/DHA .   ? YUVAFEM 10 MCG vaginal tablet PLACE 1 TABLET (10 MCG TOTAL) VAGINALLY TWO (2) TIMES A WEEK.     No facility-administered medications prior to visit.      Allergies   Allergen Reactions   ? Penicillins Other (See Comments) and Rash     Ampicillin, flesh rash in 1980s   ? Menadiol Sodium Diphosphate Rash   ? Other  [place] coloring on meds such as advil- not allergic to actual med- just the coloring   ? Acetaminophen      Denies this allergy     Past Surgical History:   Procedure Laterality Date   ? BREAST BIOPSY  1986    Fibroadenoma, ductal hyperplasia   ? Laparoscopy for subfertility     ? Left finger cyst removal x 2     ? Stapedectomy, right       Social History     Socioeconomic History   ? Marital status: Married     Spouse name: Not on file   ? Number of children: Not on file   ? Years of education: Not on file   ? Highest education level: Not on file   Occupational History   ? Not on file   Social Needs   ? Financial resource strain: Not on file   ? Food insecurity     Worry: Not on file     Inability: Not on file   ? Transportation needs     Medical: Not on file Non-medical: Not on file   Tobacco Use   ? Smoking status: Never Smoker   ? Smokeless tobacco: Never Used   Substance and Sexual Activity   ? Alcohol use: No     Alcohol/week: 0.0 oz   ? Drug use: No   ? Sexual activity: Yes     Partners: Male     Birth control/protection: None   Lifestyle   ? Physical activity     Days per week: Not on file     Minutes per session: Not on file   ? Stress: Not on file   Relationships   ? Social Wellsite geologist on phone: Not on file     Gets together: Not on file     Attends religious service: Not on file     Active member of club or organization: Not on file     Attends meetings of clubs or organizations: Not on file     Relationship status: Not on file   Other Topics Concern   ? Not on file   Social History Narrative    Patient is married, and has two children. She is of Ashkenazi Agricultural engineer.        Work: Retired from [employer] relations        Exercise:        Diet:        Transfusion:        Religion:     Family History   Problem Relation Age of Onset   ? Hypertension Father    ? Stroke Father    ? Hyperlipidemia Father    ? Depression Father    ? Prostate cancer Father 28   ? Hypertension Mother    ? Asthma Mother    ? Endometrial cancer Mother 19   ? Hyperthyroidism Mother    ? Prostate cancer Brother 44   ? Breast cancer Neg Hx    ? Ovarian cancer Neg Hx    ? Malignant hypertension Neg Hx    Dermatological patient's skin texture and turgor are within normal limits patient has a mild healed laceration over the IP joint of the right hallux there is no exudate there is no malodor any signs of infection  Vascular patient's posterior tibial and dorsalis pedis pulses are palpable.  Patient's feet are warm to the touch.  Patient  has no lower extremity edema  Neurologic patient's sensory functions including sharp and dull within normal limits  Muscular skeleton patient's right hallux is mildly edematous there is some pain on palpation. Impression there is a intra-articular fracture through the base of the distal phalanx on the right hallux which is nondisplaced.  Treatment consultation with the patient patient was given Coban and shown how to wrap the right great toe to the 2nd digit will mobilize it patient was told she can continue to use the postoperative shoe or wear a regular shoe that has a stiffer sole patient return to office in 3-4 weeks only if she is having additional discomfort.

## 2019-05-11 DIAGNOSIS — M810 Age-related osteoporosis without current pathological fracture: Secondary | ICD-10-CM

## 2019-05-11 DIAGNOSIS — E213 Hyperparathyroidism, unspecified: Secondary | ICD-10-CM

## 2019-05-12 ENCOUNTER — Ambulatory Visit: Payer: PRIVATE HEALTH INSURANCE

## 2019-05-12 NOTE — H&P
PATIENT:  Robin Spencer  MRN:  [id number]  DOB:  [birth year]  DATE OF SERVICE:  05/12/2019    PRIMARY CARE PROVIDER: Leigha Olberding, Weyman Croon., MD    Cc: Welcome to [employer] Initial Preventive Physical Examination OR Follow up Annual Wellness Visit Follow up and Follow up of Chronic Conditions  Subjective:     Robin Spencer is a a 69 y.o. female who presents for Welcome to Medicare Initial Preventive Physical Examination or Annual Wellness Visit Follow up  and discussion and follow up of chronic conditions as listed below.    Note: This is a one time examination performed within 12 months of the first effective date of Medicare Part B coverage.  G4010; or as a follow up 12 months after the last Annual Wellness Exam [phone].    1.   Osteoporosis/hyperparathyroidism:  Larey Seat and had toe fracture.  On TAM.  Will refer to Dr. Selena Batten to monitor.     2.  Breast cancer: On TAM.      3. FMS: Sleeps well with low dose of cyclobenzaprine.      Other MDs seen in continuity:  Dr. Hortencia Pilar (ophthalmologist), Dr. Bryan Lemma (oncology), Dr. Allean Found (dermatology), Dr. Krista Blue (podiatry/toe fracture), Dr. Ladona Ridgel (rheumatology/FMS).     Patient Active Problem List    Diagnosis Date Noted   ? Other fracture of right great toe, initial encounter for closed fracture 05/06/2019     2021:  Patient presents with right big toe pain, swelling, and tenderness after trauma last night. X-ray completed during this visit confirms fracture of big toe.  - Reviewed x-ray findings and images with patient  - STAT referral to patient's podiatrist for ongoing management  - Provided post-op shoe with firm sole and crutches to patient, discussed importance of avoiding any bending of the toe  - Reviewed symptomatic care with Tylenol/Ibuprofen PRN, elevation, ice  ?  ?      Orders Placed This Encounter   ? Referral to Podiatry   ?       ? Mucous cyst of digit of left hand 06/02/2018   ? Fibromyalgia 05/10/2018     Better sleep with flexeril ? Rash, Intermittent, noted after colds 04/29/2018   ? Asymptomatic varicose veins 03/01/2018   ? Vitreous floater, bilateral 06/11/2017     No retinal detachment, retinal tear, or vitreous hemorrhage on dilated fundus exam. Signs and symptoms reviewed.     ? Increased PTH level 05/08/2017     Sees Dr. Selena Batten in endocrinology.      ? Syncope and collapse 06/25/2016     2018, ECG/Labs/MRI w/o gad unrevealing, saw neurology Dr. Lorina Rabon, plan; EEG, followup      ? Immune to measles 05/09/2016     (+) Ab 2018     ? Invasive ductal carcinoma of left breast, ER+PR+HER2- 01/29/2016     2017, Bilateral tomosynthesis screening mammogram on 12/11/2015. There were no suspicious findings seen. She had postsurgical changes in both breasts. Patient then developed bilateral breast pain, left greater than right. She was sent for bilateral diagnostic ultrasounds on 01/15/2016. The right breast was negative. However, in the left breast, there was an irregular mass seen with indistinct margins, measuring 5 x 6 x 4 mm, at 12 o'clock, 6 cm from the nipple. Her left axilla was negative. Genetic testing: NO MUTATION DETECTED/This panel tests three Founder Mutations in the BRCA1/2 genes (BRCA1 187delAG, BRCA1 5385insC, BRCA2 6174delT; lumpectomy: sentinal node negative x 2.  TAM recommended by oncology, annual  endometrial surveillance given history by GYN.      ? Breast pain 01/09/2016     [facility] plan per NP, imaging, lifestyle changes     ? Cataract, nuclear sclerotic, both eyes 11/29/2015     Not yet visually significant. Continue observation.      ? Status post LASIK surgery of both eyes 11/29/2015     Performed by Dr. Ellin Goodie (2000 ?). Originally set with left eye for distance and low myopia for right.     ? Fibroid 04/09/2015     2017 on ultrasound.      ? BPPV (benign paroxysmal positional vertigo) 07/19/2013   ? Menopause 04/14/2013   ? Seborrheic dermatitis 05/11/2012   ? 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 Palpitations 04/07/2012     2009 ER PACs, PVCs, sinus arrythmia, ECHO, Stress ECHO WNL 2009     ? OA (osteoarthritis) 04/07/2012     Hands 2011, anti CCP neg, no evidence inflammation     ? Visit for preventive health examination 04/07/2012     Colo noscopy= hyperplastic polyps 2017     ? Hearing loss, right 04/07/2012     Otosclerosis, s/p stapedectomy     ? Eye allergies 01/08/2012     Not having significant symptoms at this time, but could use over the counter anti-histamine eye drops, like ketotifen or artificial tears as needed if symptoms recur.     ? Osteoporosis 11/10/2011     DEXA 2013, spine-2.4, femoral neck -2.9. On HT for menopause sx and as osteoporosis rx.; PA lumbar spine T-score of -2.7, will begin TAM per oncology.            Allergies   Allergen Reactions   ? Penicillins Other (See Comments) and Rash     Ampicillin, flesh rash in 1980s   ? Menadiol Sodium Diphosphate Rash   ? Other      Red [place] coloring on meds such as advil- not allergic to actual med- just the coloring   ? Acetaminophen      Denies this allergy       Current Outpatient Medications (GI Meds)   Medication Sig Dispense Refill   ? MAGNESIUM CITRATE PO* Take 150 mg by mouth two (2) times daily.       Current Outpatient Medications (Nutrition)   Medication Sig Dispense Refill   ? BIOTIN PO Take 1 tablet by mouth daily .       ? CALCIUM CITRATE PO Take 630 mg by mouth daily.     ? Cholecalciferol (VITAMIN D3) 5000 units CAPS Take 1 capsule by mouth daily.     ? MAGNESIUM CITRATE PO* Take 150 mg by mouth two (2) times daily.     ? Omega-3 Fatty Acids (FISH OIL PO) Take 1,400 mg by mouth two (2) times daily.       Current Outpatient Medications (GU Meds)   Medication Sig Dispense Refill   ? YUVAFEM 10 MCG vaginal tablet PLACE 1 TABLET (10 MCG TOTAL) VAGINALLY TWO (2) TIMES A WEEK. 24 tablet 1     Current Outpatient Medications (Heme Meds)   Medication Sig Dispense Refill ? cyanocobalamin 500 mcg tablet Take 500 mcg by mouth daily.       Current Outpatient Medications (Chemotherapy)   Medication Sig Dispense Refill   ? tamoxifen 20 mg tablet Take 1 tablet (20 mg total) by mouth daily. 90 tablet 3     Current Outpatient  Medications (Antibiotics)   Medication Sig Dispense Refill   ? ACYCLOVIR 400 mg tablet TAKE 1 TABLET (400 MG TOTAL) BY MOUTH TWO (2) TIMES DAILY. 180 tablet 1     Current Outpatient Medications (Derm Meds)   Medication Sig Dispense Refill   ? triamcinolone 0.5% cream Apply topically as needed for. 1 tube 1     Current Outpatient Medications (Miscellaneous)   Medication Sig Dispense Refill   ? Homeopathic Products (TRAUMEEL) OINT Apply 4 g topically daily. 50 g 3     Current Outpatient Medications (Other)   Medication Sig Dispense Refill   ? cyclobenzaprine 5 mg tablet Take 1 tablet (5 mg total) by mouth at bedtime. (Patient taking differently: Take 2.5 mg by mouth at bedtime .) 30 tablet 5   ? UNABLE TO FIND Med Name: [place] supplement- Epimedium, Tumeric, Japanese, Fleeceflower, EPA/DHA .       * These medications belong to multiple therapeutic classes and are listed under each applicable group.         Past Medical History:   Diagnosis Date   ? Anxiety    ? Breast cancer (HCC/RAF)    ? Eczema    ? Fibroids    ? GERD (gastroesophageal reflux disease)     esophagitis   ? History of migraine headaches    ? History of radiation therapy 04/2016    L breast   ? Otosclerosis    ? Post-operative nausea and vomiting    ? Rectal fissure    ? Vitreous detachment        Past Surgical History:   Procedure Laterality Date   ? BREAST BIOPSY  1986    Fibroadenoma, ductal hyperplasia   ? Laparoscopy for subfertility     ? Left finger cyst removal x 2     ? Stapedectomy, right         Family History   Problem Relation Age of Onset   ? Hypertension Father    ? Stroke Father    ? Hyperlipidemia Father    ? Depression Father    ? Prostate cancer Father 12   ? Hypertension Mother ? Asthma Mother    ? Endometrial cancer Mother 30   ? Hyperthyroidism Mother    ? Prostate cancer Brother 10   ? Breast cancer Neg Hx    ? Ovarian cancer Neg Hx    ? Malignant hypertension Neg Hx        Manroop Jakubowicz  reports that she has never smoked. She has never used smokeless tobacco. She reports that she does not drink alcohol or use drugs. Moving to [place], [place].  Mother died recently of COVID, dealing with Estate.  Two sons, one an Charity fundraiser at [employer], another in [place] living with his boyfriend.     Opioid use: None.     Diet:  Increased protein and vegetables in diet.  Eats some carbs.     Physical Activities, including limitations: [place]usually about one hour most days until toe was broken).     Self assessment of health:  Good.     Social support:  Husband, brother, other family.     Activities of Daily Living (note any not independent):   bathing: personal hygiene and grooming; dressing: dressing and undressing; transferring: movement and mobility; toileting: continence-related tasks including control and hygiene; eating: preparing food and feeding    Instrumental Activities of Daily Living (note any not independent): cooking, cleaning, finances, shopping, healthcare  and medication, using telephones and technology, transportation, and caring for other individuals and pets.    Fall risk:      (1) Falls in the past year?  Yes  (2) Injury from fall in the past year? Yes, broke toe.   (3) Feels unsteady when standing or walking? No, declines PT.   (4) Worried about falling? No.     Home Safety:      (1) Are you worried about clutter in your home? No  (2) Do you have poor lighting by day or night in your home? No  (3) Are there slippery surfaces in your home? No  (4) Are you worried about falling when using your toilet, bath or shower facilities? No        Goals of Care/DPA:  Has not yet completed.  Will consider.       Immunization History   Administered Date(s) Administered ? COVID-19, mRNA, LNP-S, PF, 100 Mcg/0.5 mL Dose (Moderna) 04/06/2019, 05/04/2019   ? Hepatitis A, Adult 12/10/2006, 04/24/2016   ? Hepatitis A, unspecified formulation 12/10/2006   ? SARS-CoV-2 (COVID-19) vaccine, unspecified 04/06/2019   ? Tdap 04/26/2010, 01/24/2017   ? Typhoid Inactivated, ViCPs 04/24/2016   ? influenza vaccine IM quadrivalent (Fluzone Quad) (PF) SYR/SDV (68 months of age and older) 02/14/2014   ? influenza vaccine IM quadrivalent high dose (Fluzone High Dose Quad) (PF) SYR (62 years of age and older) 12/08/2018   ? influenza vaccine IM trivalent adjuvanted (FluAD) (PF) SYR (80 years of age and older) 01/12/2018   ? influenza vaccine IM trivalent high dose (Fluzone High Dose) (PF) SYR (53 years of age and older) 01/16/2017   ? influenza, unspecified formulation 02/01/1999, 01/22/2000, 01/28/2002, 02/14/2014, 02/01/2016, 01/12/2018   ? pneumococcal polysaccharide vaccine 23-valent (Pneumovax) 05/18/2018   ? zoster live vaccine (Zostavax) 04/15/2012   ? zoster vac recomb adjuvanted (Shingrix) 06/26/2016, 06/02/2017         Health Maintenance   Topic Date Due   ? Advance Directive  08/11/2016   ? Annual Preventive Wellness Visit  05/11/2019   ? Pneumococcal Vaccine (2 of 2 - PCV13) 05/18/2019   ? Breast Ca Screening: MAMMOGRAM  11/30/2019   ? Cervical Ca Screening: HPV Testing  04/08/2020   ? Cervical Ca Screening: PAP Smear  04/08/2020   ? Colon Ca Screening: COLONOSCOPY  10/07/2025   ? Tdap/Td Vaccine (3 - Td) 01/25/2027   ? Influenza Vaccine  Completed   ? Shingles (Zostavax) Vaccine  Completed   ? Osteoporosis Early Detection DEXA Scan  Completed   ? Hepatitis C Screening  Completed   ? Shingles (Shingrix) Vaccine  Completed   ? COVID-19 Vaccine  Completed         Review of Systems:     Depression screen:    (1) During the past month, have you been bothered by feeling down, depressed or helpless? Yes (2) During the past month, have you often been bothered by little interest or pleasure in doing things? No    Acute grief reaction, feels hopeful, optimistic overall. Declined therapy.     Hearing: Wearing only right hearing aid, left wire broken has appt.     Vision: Sees Dr. Hortencia Pilar.     Const:  Lost 10 lbs by trying.  Cor:  No exertional chest pain.  Pulm:  No exertional dyspnea.  Endo:  Postmenopausal  Psych:  Feels anxious, notes sometimes feels trouble taking a deep breath and swallowing when anxious, but no difficulty eating or  exercising.  Has had anxiety with mother's illness and death.   Objective:      BP 117/75 (BP Location: Left arm, Patient Position: Sitting, Cuff Size: Regular)  ~ Pulse 76  ~ Temp 36.4 ?C (97.6 ?F) (Forehead)  ~ Resp 16  ~ Ht 5' 4'' (1.626 m)  ~ Wt 123 lb 9.6 oz (56.1 kg)  ~ LMP  (LMP Unknown)  ~ SpO2 98%  ~ BMI 21.22 kg/m?   Body mass index is 21.22 kg/m?Marland Kitchen        System Check if normal Positive or additional negative findings   Constit  [x]  General appearance     Eyes  []  Conj/Lids []  Pupils  []  Fundi     HENMT  []  External ears/nose []  Otoscopy   []  Gross Hearing []  Nasal mucosa   []  Lips/teeth/gums []  Oropharynx    []  mucus membranes []  Head     Neck  [x]  Inspection/palpation [x]  Thyroid     Resp  [x]  Effort []  Wheezing    [x]  Auscultation  []  Crackles     CV  [x]  Rhythm/rate   []  Murmurs   []  LEE   []  JVP non-elevated    Normal pulses:   []  Radial []  Femoral  []  Pedal     Breast  [x]  Inspection [x]  Palpation     GI  [x]  abd masses    [x]  tenderness   []  rebound/guarding   []  Liver/spleen []  Rectal     GU  M: []  Scrotum []  Penis []  Prostate   F:  []  External []  vaginal wall        []  Cervix  []  mucus        []  Uterus    []  Adnexa      Lymph  [x]  Neck [x]  Axillae []  Groin     MSK Specify site examined:    []  Inspect/palp []  ROM   []  Stability []  Strength/tone         Skin  []  Inspection []  Palpation Bruising of right foot and great toe consistent with known fracture Neuro  []  CN2-12 intact grossly   []  Alert and oriented   []  DTR      []  Muscle strength      []  Sensation   []  Gait/balance     Psych  [x]  Insight/judgement     [x]  Mood/affect    [x]  Gross cognition        A trained chaperone was present for this exam/procedure: No  Reason why a trained chaperone was not present: Patient declined        LABS:  Lab Results   Component Value Date    WBC 3.76 (L) 09/23/2016    HGB 13.1 09/23/2016    HCT 37.3 09/23/2016    MCV 99.7 (H) 09/23/2016    PLT 202 09/23/2016     Lab Results   Component Value Date    CREAT 0.72 05/10/2018    BUN 19 05/10/2018    NA 143 05/10/2018    K 4.5 05/10/2018    CL 102 05/10/2018    CO2 32 (H) 05/10/2018     Lab Results   Component Value Date    ALT 15 05/10/2018    AST 19 05/10/2018    ALKPHOS 56 05/10/2018    BILITOT 0.2 05/10/2018     Lab Results   Component Value Date    CHOL 210 05/10/2018    CHOLDLCAL 144 (H) 04/20/2014    TRIGLY 99  04/20/2014     Lab Results   Component Value Date    TSH 2.0 05/10/2018     No results found for: HGBA1C        Xr Knee Ap+lat+sunrise Left (3 Views)    Result Date: 04/08/2019  IMPRESSION: No acute fracture or dislocation. No knee effusion. Joint spaces are maintained. Signed by: Pincus Badder   04/08/2019 7:28 AM    Xr Toe Ap+lat+obl Right (3 Views) ; Big Toe    Result Date: 05/04/2019  IMPRESSION: Minimally displaced intra-articular fracture of the dorsal base of the first distal phalanx. Diffuse soft tissue swelling in the first digit. Moderate first MTP osteoarthritis. Signed by: Pincus Badder   05/04/2019 5:04 PM    Xr Chest Ap+lat (2 Views)    Result Date: 04/27/2019  IMPRESSION: Normal lung volumes. Lungs are clear. No pneumothorax or pleural effusion. Normal size of the cardiac silhouette. Surgical clips along the left breast and axilla. No acute osseous abnormalities. Signed by: Donzetta Kohut   04/27/2019 7:31 PM    [place] Left Breast, Screening    Result Date: 12/01/2018 Post surgical scars in both breasts are benign.  Routine screening mammogram in 1 year is recommended.  BI-RADS Category 2: Benign Finding(s)  Report electronically signed by: Roland Earl, M.D.     Report electronically signed by: Roland Earl  12/01/2018 at 03:03:12 PM    Technologist: Cyndi Lennert                                                               [place] Right Breast, Screening    Result Date: 12/01/2018  Post surgical scars in both breasts are benign.  Routine screening mammogram in 1 year is recommended.  BI-RADS Category 2: Benign Finding(s)  Report electronically signed by: Roland Earl, M.D.     Report electronically signed by: Roland Earl  12/01/2018 at 03:03:12 PM    Technologist: Virl Son Tomosynthesis, Screening, Bilat Breast    Result Date: 12/01/2018  Post surgical scars in both breasts are benign.  Routine screening mammogram in 1 year is recommended.  BI-RADS Category 2: Benign Finding(s)  Report electronically signed by: Roland Earl, M.D.     Report electronically signed by: Roland Earl  12/01/2018 at 03:03:12 PM    Technologist: Cyndi Lennert                                                                     Assessment & Plan:     Assessment:   1.   Osteoporosis/hyperparathyroidism:  Larey Seat and had toe fracture.  On TAM.  Will refer to Dr. Selena Batten to monitor.     2.  Breast cancer:  On TAM.      3. FMS: Sleeps well with low dose of cyclobenzaprine.      4. Anxiety: Globus and occasional inability to take deep breath sx.  Discussed with the patient.  She will report back if any sx persist as grief/anxiety improve.       Annual Preventive Exam  Patient Active Problem List   Diagnosis   ? Osteoporosis   ? Eye allergies   ? Genital HSV   ? PPD positive   ? Palpitations   ? OA (osteoarthritis)   ? Visit for preventive health examination   ? Hearing loss, right   ? Seborrheic dermatitis   ? Menopause ? BPPV (benign paroxysmal positional vertigo)   ? Fibroid   ? Cataract, nuclear sclerotic, both eyes   ? Status post LASIK surgery of both eyes   ? Breast pain   ? Invasive ductal carcinoma of left breast, ER+PR+HER2-   ? Immune to measles   ? Syncope and collapse   ? Increased PTH level   ? Vitreous floater, bilateral   ? Asymptomatic varicose veins   ? Rash, Intermittent, noted after colds   ? Fibromyalgia   ? Mucous cyst of digit of left hand   ? Other fracture of right great toe, initial encounter for closed fracture     Health Maintenance   Topic Date Due   ? Advance Directive  08/11/2016   ? Annual Preventive Wellness Visit  05/11/2019   ? Pneumococcal Vaccine (2 of 2 - PCV13) 05/18/2019   ? Breast Ca Screening: MAMMOGRAM  11/30/2019   ? Cervical Ca Screening: HPV Testing  04/08/2020   ? Cervical Ca Screening: PAP Smear  04/08/2020   ? Colon Ca Screening: COLONOSCOPY  10/07/2025   ? Tdap/Td Vaccine (3 - Td) 01/25/2027   ? Influenza Vaccine  Completed   ? Shingles (Zostavax) Vaccine  Completed   ? Osteoporosis Early Detection DEXA Scan  Completed   ? Hepatitis C Screening  Completed   ? Shingles (Shingrix) Vaccine  Completed   ? COVID-19 Vaccine  Completed         Personalized Prevention Plan Services and  Management of Medical Conditions:     Health Maintenance   Topic Date Due   ? Advance Directive  08/11/2016   ? Annual Preventive Wellness Visit  05/11/2019   ? Pneumococcal Vaccine (2 of 2 - PCV13) 05/18/2019   ? Breast Ca Screening: MAMMOGRAM  11/30/2019   ? Cervical Ca Screening: HPV Testing  04/08/2020   ? Cervical Ca Screening: PAP Smear  04/08/2020   ? Colon Ca Screening: COLONOSCOPY  10/07/2025   ? Tdap/Td Vaccine (3 - Td) 01/25/2027   ? Influenza Vaccine  Completed   ? Shingles (Zostavax) Vaccine  Completed   ? Osteoporosis Early Detection DEXA Scan  Completed   ? Hepatitis C Screening  Completed   ? Shingles (Shingrix) Vaccine  Completed   ? COVID-19 Vaccine  Completed Orders Placed This Encounter   ? Comprehensive Metabolic Panel   ? CBC & Auto Differential   ? TSH   ? Vitamin D,25-Hydroxy   ? Chol,LDL,Quant   ? Cholesterol   ? Cholesterol, HDL   ? PTH,Intact & Calcium   ? Calcium,Ionized   ? Referral to Endocrinology     Personalized Health Advice (if applicable, includes referrals to health education or preventive counseling services or programs):      Patient Instructions   (1) See Dr. Hetty Ely to  follow up your osteoporosis and hyperparathyroidism.       (2) [facility] wants you to have a voice in your healthcare. Completing an advance directive is a good way to ensure that we honor your wishes for health care. This allows you to:  ? Name a trusted person to help make medical decisions in case of an emergency  ? Tell this trusted person and your doctors what is most important in your life and for your healthcare now and in the future     You are receiving this message because you do not have an enduring advance directive (or POLST) in the [facility] medical record or your document is more than five years old. Your physician wants to ensure that you have an advance directive and it is current. Please look over and fill in an advance directive. You can access a [place] advance directive at [email or web address]    Discuss your advance directive and wishes for medical care at the next visit with your physician. Turn in your advance directive to your physician by mail or FAX, through your [place] portal, or bring it to clinic  [facility] wants you to have a voice in your healthcare. Completing an advance directive is a good way to ensure that we honor your wishes for health care. This allows you to:  ? Name a trusted person to help make medical decisions in case of an emergency  ? Tell this trusted person and your doctors what is most important in your life and for your healthcare now and in the future You are receiving this message because you do not have an enduring advance directive (or POLST) in the [facility] medical record or your document is more than five years old. Your physician wants to ensure that you have an advance directive and it is current. Please look over and fill in an advance directive. You can access a [place] advance directive at [email or web address]    Discuss your advance directive and wishes for medical care at the next visit with your physician. Turn in your advance directive to your physician by mail or FAX, through your [place] portal, or bring it to clinic  [facility] wants you to have a voice in your healthcare. Completing an advance directive is a good way to ensure that we honor your wishes for health care. This allows you to:  ? Name a trusted person to help make medical decisions in case of an emergency  ? Tell this trusted person and your doctors what is most important in your life and for your healthcare now and in the future     You are receiving this message because you do not have an enduring advance directive (or POLST) in the [facility] medical record or your document is more than five years old. Your physician wants to ensure that you have an advance directive and it is current. Please look over and fill in an advance directive. You can access a [place] advance directive at [email or web address]    Discuss your advance directive and wishes for medical care at the next visit with your physician. Turn in your advance directive to your physician by mail or FAX, through your [place] portal, or bring it to clinic    Return in about 1 year (around 05/11/2020) for Annual Wellness- 30 minutes.        The above plan of care, diagnosis, order, and follow-up were discussed with the patient. Questions related to this recommended plan of care were answered.  Verbal or written information was provided to the patient about end of life planning/advanced care planning.     The services and diagnoses listed above in addition to the Medicare Annual Visit constituted separate and distinct services and diagnoses that were addressed and discussed with the patient today.  Author:  Ranae Pila. Irlene Crudup 05/12/2019 11:25 AM

## 2019-05-16 NOTE — Progress Notes
Attending Note: I saw and examined the patient with the resident and agree with the findings and plan of care we developed as documented in the resident's note.  Halle Davlin, MD

## 2019-05-20 ENCOUNTER — Telehealth: Payer: BLUE CROSS/BLUE SHIELD | Attending: "Endocrinology

## 2019-05-20 DIAGNOSIS — M810 Age-related osteoporosis without current pathological fracture: Secondary | ICD-10-CM

## 2019-05-20 DIAGNOSIS — E7800 Pure hypercholesterolemia, unspecified: Secondary | ICD-10-CM

## 2019-05-20 DIAGNOSIS — E349 Endocrine disorder, unspecified: Secondary | ICD-10-CM

## 2019-05-20 NOTE — Patient Instructions
1.  You are due for your bone density scan at the end of 04/2020.  Please let me know if you find a local place in [place] to do the scan and we can send you the requisition.  2.  You will need labs in a year as well to look at your PTH level again.  3.  Someone from my office will call you to schedule a follow up appointment in 1 year.

## 2019-05-20 NOTE — Progress Notes
OUTPATIENT ENDOCRINOLOGY CONSULTATION    Date of Service: 05/20/2019  Referring Provider: Hetty Ely, MD  PMD: Pregler, Weyman Croon., MD    Chief Complaint:   Osteoporosis     History of Present Illness:   Robin Spencer is a 69 y.o. female who has a past medical history of Anxiety, Breast cancer (HCC/RAF), Eczema, Fibroids, GERD (gastroesophageal reflux disease), History of migraine headaches, History of radiation therapy (04/2016), Otosclerosis, Post-operative nausea and vomiting, Rectal fissure, and Vitreous detachment. and presents to Endocrinology Clinic for Osteoporosis.    Osteoporosis  The patient was first found to have low bone density in 2013.    Fibromyalgia diagnosed by rheumatologist Dr. Ladona Ridgel, but pt does not feel like her pain is debilitating.    Prev on bioidentical hormones for HRT - stopped in 01/2016 or so upon dx of breast cancer.  Now on tamoxifen.    01/2017 thyroid US showed no nodules    Interval Events:  Last seen 05/2018:    Video visit today, consent obtained.     Mother passed away from COVID-19 age 4.  She is moving to [place] later this month.  Has had a lot of stress recently.    No falls or fractures    Exercising a bit less than before but plans to start regularly when she moves to [place].    Remains on tamoxifen    Vitamin D3 5000 IU daily   Calcium 600 mg daily  Also taking Mg 150 BID.  Dietary calcium: 1 cup yogurt, lots of kale/spinach daily in smoothie  Having less string cheese and almond milk         Treatment History   Dates Comments    [x]  No treatment to date            Risk Factors for Accelerated Bone Loss        History of fracture  []  Yes  [x]  No    History of falls  []  Yes  [x]  No    History of malabsorption  []  Yes  [x]  No    History of nephrolithiasis  []  Yes  [x]  No    History of rheumatoid arthritis  []  Yes  [x]  No    History of hyperthyroidism  []  Yes  [x]  No    History of Cushing's  []  Yes  [x]  No    Family history of osteoporosis  [x]  Yes  []  No Osteopenia in mother Prior/current glucocorticoid use  []  Yes  [x]  No    Antiepilepsy drugs  []  Yes  [x]  No    PPI  []  Yes  []  No Prilosec in the past for weeks only   Aromatase inhibitors  []  Yes  [x]  No    Androgen deprivation therapy  []  Yes  [x]  No    EtOH  []  Yes  []  No Rare alcohol   Tobacco  []  Yes  [x]  No      Past Medical History:     Past Medical History:   Diagnosis Date   ? Anxiety    ? Breast cancer (HCC/RAF)    ? Eczema    ? Fibroids    ? GERD (gastroesophageal reflux disease)     esophagitis   ? History of migraine headaches    ? History of radiation therapy 04/2016    L breast   ? Otosclerosis    ? Post-operative nausea and vomiting    ? Rectal fissure    ? Vitreous detachment  Past Surgical History:     Past Surgical History:   Procedure Laterality Date   ? BREAST BIOPSY  1986    Fibroadenoma, ductal hyperplasia   ? Laparoscopy for subfertility     ? Left finger cyst removal x 2     ? Stapedectomy, right       Medications:     Current Outpatient Medications   Medication Sig   ? ACYCLOVIR 400 mg tablet TAKE 1 TABLET (400 MG TOTAL) BY MOUTH TWO (2) TIMES DAILY.   ? BIOTIN PO Take 1 tablet by mouth daily .     ? CALCIUM CITRATE PO Take 630 mg by mouth daily.   ? Cholecalciferol (VITAMIN D3) 5000 units CAPS Take 1 capsule by mouth daily.   ? cyanocobalamin 500 mcg tablet Take 500 mcg by mouth daily.   ? cyclobenzaprine 5 mg tablet Take 1 tablet (5 mg total) by mouth at bedtime. (Patient taking differently: Take 2.5 mg by mouth at bedtime .)   ? Homeopathic Products (TRAUMEEL) OINT Apply 4 g topically daily.   ? MAGNESIUM CITRATE PO Take 150 mg by mouth two (2) times daily.   ? Omega-3 Fatty Acids (FISH OIL PO) Take 1,400 mg by mouth two (2) times daily.   ? tamoxifen 20 mg tablet Take 1 tablet (20 mg total) by mouth daily.   ? triamcinolone 0.5% cream Apply topically as needed for.   ? UNABLE TO FIND Med Name: Promedley supplement- Epimedium, Tumeric, Japanese, Fleeceflower, EPA/DHA . ? YUVAFEM 10 MCG vaginal tablet PLACE 1 TABLET (10 MCG TOTAL) VAGINALLY TWO (2) TIMES A WEEK.     No current facility-administered medications for this visit.      Allergies:     Allergies   Allergen Reactions   ? Penicillins Other (See Comments) and Rash     Ampicillin, flesh rash in 1980s   ? Menadiol Sodium Diphosphate Rash   ? Other      Red [place] coloring on meds such as advil- not allergic to actual med- just the coloring   ? Acetaminophen      Denies this allergy     Social History:    reports that she has never smoked. She has never used smokeless tobacco. She reports that she does not drink alcohol or use drugs.   Social History     Social History Narrative    Patient is married, and has two children. She is of Ashkenazi Agricultural engineer.        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 Hypertension Father    ? Stroke Father    ? Hyperlipidemia Father    ? Depression Father    ? Prostate cancer Father 25   ? Hypertension Mother    ? Asthma Mother    ? Endometrial cancer Mother 45   ? Hyperthyroidism Mother    ? Prostate cancer Brother 46   ? Breast cancer Neg Hx    ? Ovarian cancer Neg Hx    ? Malignant hypertension Neg Hx      Review of Systems:   14 system ROS negative except as described above and in the HPI.  Physical Examination:  Vital Signs: LMP  (LMP Unknown)  There is no height or weight on file to calculate BMI.   Wt Readings from Last 3 Encounters:   05/12/19 123 lb 9.6 oz (56.1 kg)   05/04/19 125 lb (56.7 kg)   04/18/19 124 lb (56.2 kg)      Gen: WD,WN, conversant  Psych: normal affect    Laboratory Results:   I have   [] reviewed radiology,  [] reviewed labs,  [] reviewed & summarized old records, [] requested outside medical records.    Lab Results   Component Value Date    CALCIUM 9.1 05/12/2019    CALCIUM 9.1 05/12/2019 ICALCOR 1.10 05/12/2019    CREAT 0.64 05/12/2019    ALBUMIN 4.7 05/12/2019    CREATRANUR 42 01/22/2017    PTHINT 62 (H) 05/12/2019    VITD25OH 48 05/12/2019    TSH 0.94 05/12/2019    T4AUTO 1.3 01/22/2017    NTXRANUR 22 01/22/2017    OSTEOCALCIN 15 01/22/2017    ALKPHOS 56 05/12/2019       Lab Results   Component Value Date    WBC 5.37 05/12/2019    HGB 13.5 05/12/2019    HCT 40.2 05/12/2019    MCV 93.7 05/12/2019    PLT 228 05/12/2019       Bone Turnover Markers  Date UNTx BSAP Serum OC                Studies:   DXA 05/13/18 Lenell Antu)   Assessment:  1. The left hip T-score of -2.1 meets the criteria for osteopenia and reflects a change of +1.3% from the baseline exam of 04/2016.  The femoral neck T-score of -2.2 is consistent with osteopenia and reflects a change of +1.6%.  2. The right hip and femoral neck T-score's of -2.0 and -2.1 respectively, meet the criteria for osteopenia and represent the right femur baseline.  3. The PA lumbar spine T-score of -2.6 meets the criteria for osteoporosis according to the [facility] classification and reflects a change of +1.1%.    4. The *lateral lumbar spine T-score = n/a  *The lateral lumbar spine view provides a sensitive measure of trabecular bone and is primarily to be used to evaluate response to treatment. A low T-score in the lateral lumbar view alone does NOT fulfill the diagnostic criteria for osteopenia or osteoporosis. (ISCD Practice Recommendations 2006)   Z-scores used in reporting BMD for children/adolescents 5-19, pre-menopausal females and males < 50  ?    DXA 04/17/16 Lenell Antu)  Assessment:  1. The left hip and femoral neck T-score's of -2.2 and -2.3 respectively, meet the criteria for osteopenia.  2. The PA lumbar spine T-score of -2.7 meets the criteria for osteoporosis according to the [facility] classification.    3. The *lateral lumbar spine T-score = n/a    DXA 11/03/11 ([facility])  BASELINE DUAL ENERGY X-RAY ABSORPTIOMETRY (DXA) STUDY Bone densitometry was performed using a Hologic Discovery A bone densitometer.  ?  PA Spine:       The average bone mineral density from L1-4 is 0.787 g/cm2, 2.4 standard deviations below the mean for normal young adults (*T-scores) or 75% of normal. Compared to age and sex-matched controls (**Z-scores), this density is 0.9 standard deviations   below the mean.  ?  Total Hip:       The bone mineral density in the left total hip is 0.706 g/cm2, 1.9 standard deviations below the mean for normal young adults (*T-scores) or 75% of normal.  Compared  to age and sex-matched controls (**Z-scores), this density is 1.0 standard   deviations below the mean.  ?  Femoral Neck:       The bone mineral density in the left femoral neck is 0.531 g/cm2, 2.9 standard deviations below the mean for normal young adults (*T-scores) or 63% of normal.  Compared to age and sex-matched controls (**Z-scores), this density is 1.6 standard   deviations below the mean.  ?  *T-score compares the BMD of an individual to peak bone mass and expresses the difference as a standard deviation score,**Z-score compares the BMD of an individual with age-matched, gender-matched, and race-matched controls and expresses the difference   as a standard deviation score.  ?  IMPRESSION:  1.  This is a baseline study at the [facility].  2.  The patient has osteoporosis.    Results for orders placed or performed during the hospital encounter of 02/11/17   US thyroid-parathyroid    Narrative    US THYROID PARATHYROID     CLINICAL HISTORY: thyroid nodule.    COMPARISON: None.    TECHNIQUE: Utilizing high resolution ultrasound transducers, real time 2D sonography and limited color Doppler imaging of the neck was performed.    FINDINGS:      The right thyroid lobe measures 4.7 x 1.1 x 1.1 cm and the left thyroid lobe measures 3.5 x 1.0 x 1.3 cm. The isthmus measures 2 mm in AP dimension. The background thyroid parenchyma is homogeneous. Blood flow in the gland is normal on color Doppler.    No nodules are seen.    No parathyroid nodule is identified.    No morphologically abnormal or enlarged cervical chain lymph nodes.      Impression    IMPRESSION:    Normal ultrasound of the thyroid.      *From the American Thyroid Association 2015 Management Guidelines for Adult Patients with Thyroid Nodules:  High suspicion nodules: Recommend FNA if > 1 cm (Strong recommendation, Moderate-quality evidence);  Intermediate suspicion nodules: Recommend FNA if > 1 cm (Strong recommendation, Low-quality evidence);  Low suspicion nodules: Recommend FNA if > 1.5 cm (Weak recommendation, Low-quality evidence);  Very low suspicion nodules: Consider FNA if > 2 cm versus observation (Weak recommendation, Moderate-quality evidence);  Benign nodules: No biopsy (Strong recommendation, Moderate-quality evidence).    Dictated by: Mohammed Suhail   02/11/2017 12:02 PM    Signed by: Roanna Raider RAGAVENDRA   02/11/2017 12:56 PM             DEXA  Date LS T-score FN T-score TH T-score Rad T-score % Change BMD                Assessment:   Robin Spencer is a 69 y.o. female seen in the Endocrine clinic for evaluation and management of osteoporosis.    Plan:   1. Osteoporosis, unspecified osteoporosis type, unspecified pathological fracture presence: diagnosed in 2013 on DXA, about stable on DXA in 2018 but on different machine.  DXA 2020 shows stability in BMD as well. Risk factors for accelerated bone loss: +fam hx, post-menopausal woman, Caucasian, thin.  - pt is hesitant to start medication and agree to hold off on now given stability of BMD.  - repeat labs in 1 year  - PTH,Intact & Calcium; Future  - Vitamin D,25-Hydroxy; Future  - Encouraged weight bearing exercise  - Goal calcium intake is 1200 mg of elemental calcium per day, ideally more in diet than in supplement. -  Goal vitamin D intake is 800 IU of vitamin D3 per day. Goal vit D 25 OH level is >30.  - Next DXA is planned for 04/2020 - will get in [place] or pt may consider coming back to get it here.  She will message me closer to when she is due.    2. Hyperparathyroidism: Elevated PTH and normal calcium.  Differential diagnoses include secondary hyperparathyroidism (vitamin D deficiency, hypercalciuria, decreased renal function, low calcium intake) vs ''normocalcemic'' primary hyperparathyroidism. 24 hr urine calcium on the low side at 88 in 2018  - calcium 600 mg daily  - dietary calcium intake discussed  - continue D3 5000 IU daily  - repeat PTH, [place] next year    3. Invasive ductal carcinoma of left breast, ER+PR+HER2-  - on Tamoxifen    4. LDL high: borderline at 100  - no indication for statin  - work on diet      Patient Instructions   1.  You are due for your bone density scan at the end of 04/2020.  Please let me know if you find a local place in [place] to do the scan and we can send you the requisition.  2.  You will need labs in a year as well to look at your PTH level again.  3.  Someone from my office will call you to schedule a follow up appointment in 1 year.    Return in about 1 year (around 05/19/2020).     30 minutes spent preparing for this visit, obtaining history, evaluating/examining/counseling the patient, and documenting into the electronic health record.     Author:  Lorenza Chick, MD 05/20/2019 3:01 PM

## 2019-05-24 ENCOUNTER — Ambulatory Visit: Payer: BC Managed Care – POS

## 2019-05-25 ENCOUNTER — Ambulatory Visit: Payer: BC Managed Care – POS | Attending: Audiologist

## 2019-05-25 DIAGNOSIS — H908 Mixed conductive and sensorineural hearing loss, unspecified: Secondary | ICD-10-CM

## 2019-05-25 DIAGNOSIS — H905 Unspecified sensorineural hearing loss: Secondary | ICD-10-CM

## 2019-05-25 NOTE — Progress Notes
Encounter for billing purposes ($150 for replacement out of warranty receiver).

## 2019-05-25 NOTE — Consults
Adult Audiologic Evaluation    History:  ? Prior audiological evaluation: 09/24/16 at which time results revealed a mild to moderately-severe mixed hearing loss in the right ear and a mild to moderate sensorineural hearing loss in the left ear.   ? Hearing aid use (Bilateral Phonak RICs) in both ears fit at [facility] on 11/03/13 by previous managing audiologist AnneMarie.   ? Dizziness: Reported history of BPPV.   ? Denies all other otologic symptoms.     Objective/Results:  Otoscopy    Right ear: Clear ear canal with normal tympanic membrane appearance     Left ear:   Clear ear canal with normal tympanic membrane appearance    Tympanometry    Right ear: DNE given surgical history     Left ear:  DNE    Word recognition     Right ear: Unimpaired at an adequate intensity    Left ear:   Unimpaired at an adequate intensity    Audiological Diagnosis:  ? Significant mixed hearing loss in the right ear.  ? Hearing is impaired to a degree that likely impairs communication in some environments (e.g., in noisy environments or at a distance). Hearing aids may provide patient with benefit for tinnitus management and/or if patient is perceiving any difficulties in communication.    Recommendations:  ? Hearing evaluation every 1-2 years or as needed.            For further information, please see Audiological Assessment (located under the Notes tab of Chart Review for [facility] providers).      Hearing Aid Check    Patient History: Robin Spencer has a known mixed hearing loss in the right ear and a sensorineural hearing loss in the left ear. Rejeana Brock was fitted with hearing aids in both ears on 11/03/13.  Rejeana Brock was seen today for on-going management of hearing aid(s).    Hearing aids:   Manufacturer: Phonak  Model: Audeo V70 -312  Right: Serial number(s) [id number]  Left: Serial number(s) [id number]  Domes: Large closed domes  Warranty Expiration: 01/23/17  Initial Hearing Aid Fitting: 11/03/2013 ?  Today fit with ComPilot II [id number]  (reports infrequent use.)  ComPilot Warranty Expiration: 03/14/2015    Patient reports:   ? Left speaker wire is broken and needs replacement.   ? Patient notes she will be moving to [place] next week.     Actions taken:  ? Replaced left speaker wire. Patient paid $150 for new wire today.   ? Replaced both domes to large closed domes as patient had replaced them with medium open domes.   ? Discussed the fact that swapping domes can significantly impact the acoustics and her hearing ability with the hearing aids as the hearing aids were programmed using more occluding domes (open large domes).   ? Hearing aids cleaned and checked. Listening check revealed good sound quality.     Education:  Topic: hearing aid(s)  Person Taught: patient    Barrier: None    Needs: Knows well    Teaching Method: Audiovisual and Verbal instruction    Outcome: Able to repeat information and/or demonstrate what was taught    Follow-up Recommendations:  Regular daily use of hearing aid(s) was encouraged.  Patient should establish care in [place] for routine hearing aid checks/consideration of new hearing aids and annual hearing tests.       [employer], AuD, 05/25/2019  Senior Audiologist

## 2019-05-30 MED ORDER — TAMOXIFEN CITRATE 20 MG PO TABS
ORAL_TABLET | ORAL | 3 refills | 60.00000 days | Status: AC
Start: 2019-05-30 — End: ?

## 2019-06-01 ENCOUNTER — Ambulatory Visit: Payer: BLUE CROSS/BLUE SHIELD

## 2019-06-01 DIAGNOSIS — D225 Melanocytic nevi of trunk: Secondary | ICD-10-CM

## 2019-06-01 DIAGNOSIS — L821 Other seborrheic keratosis: Secondary | ICD-10-CM

## 2019-06-01 DIAGNOSIS — L814 Other melanin hyperpigmentation: Secondary | ICD-10-CM

## 2019-06-01 DIAGNOSIS — D1801 Hemangioma of skin and subcutaneous tissue: Secondary | ICD-10-CM

## 2019-06-01 NOTE — Progress Notes
PATIENT: Robin Spencer  MRN: [id number]  DOB: September 21, 1950  DATE OF SERVICE: 06/01/2019    REFERRING PRACTITIONER: Tye Savoy, MD  PRIMARY CARE PROVIDER: Pregler, Weyman Croon., MD  CHIEF COMPLAINT:   Chief Complaint   Patient presents with   ? General Skin Check       Subjective:      Robin Spencer is a 69 y.o. year old female with no h/o skin cancer here for FBSE.  Patient asks about a pink growth on R axilla. otherwise denies any new or changing lesions.      Past Medical History:   Diagnosis Date   ? Anxiety    ? Breast cancer (HCC/RAF)    ? Eczema    ? Fibroids    ? GERD (gastroesophageal reflux disease)     esophagitis   ? History of migraine headaches    ? History of radiation therapy 04/2016    L breast   ? Otosclerosis    ? Post-operative nausea and vomiting    ? Rectal fissure    ? Vitreous detachment      Past Surgical History:   Procedure Laterality Date   ? BREAST BIOPSY  1986    Fibroadenoma, ductal hyperplasia   ? Laparoscopy for subfertility     ? Left finger cyst removal x 2     ? Stapedectomy, right       Family History   Problem Relation Age of Onset   ? Hypertension Father    ? Stroke Father    ? Hyperlipidemia Father    ? Depression Father    ? Prostate cancer Father 78   ? Hypertension Mother    ? Asthma Mother    ? Endometrial cancer Mother 50   ? Hyperthyroidism Mother    ? Prostate cancer Brother 23   ? Breast cancer Neg Hx    ? Ovarian cancer Neg Hx    ? Malignant hypertension Neg Hx      Current Outpatient Medications   Medication Sig   ? ACYCLOVIR 400 mg tablet TAKE 1 TABLET (400 MG TOTAL) BY MOUTH TWO (2) TIMES DAILY.   ? BIOTIN PO Take 1 tablet by mouth daily .     ? CALCIUM CITRATE PO Take 630 mg by mouth daily.   ? Cholecalciferol (VITAMIN D3) 5000 units CAPS Take 1 capsule by mouth daily.   ? cyanocobalamin 500 mcg tablet Take 500 mcg by mouth daily.   ? cyclobenzaprine 5 mg tablet Take 1 tablet (5 mg total) by mouth at bedtime. (Patient taking differently: Take 2.5 mg by mouth at bedtime .)   ? Homeopathic Products (TRAUMEEL) OINT Apply 4 g topically daily.   ? MAGNESIUM CITRATE PO Take 150 mg by mouth two (2) times daily.   ? Omega-3 Fatty Acids (FISH OIL PO) Take 1,400 mg by mouth two (2) times daily.   ? TAMOXIFEN 20 mg tablet TAKE 1 TABLET BY MOUTH EVERY DAY   ? triamcinolone 0.5% cream Apply topically as needed for.   ? UNABLE TO FIND Med Name: [place]ed[place] supplement- Epimedium, Tumeric, Japanese, Fleeceflower, EPA/DHA .   ? YUVAFEM 10 MCG vaginal tablet PLACE 1 TABLET (10 MCG TOTAL) VAGINALLY TWO (2) TIMES A WEEK.     No current facility-administered medications for this visit.      Allergies   Allergen Reactions   ? Penicillins Other (See Comments) and Rash     Ampicillin, flesh rash in 1980s   ? Menadiol  Sodium Diphosphate Rash   ? Other      Red [place] coloring on meds such as advil- not allergic to actual med- just the coloring   ? Acetaminophen      Denies this allergy       Review of Systems:  Constit: feels well, no complaints  ENMT: not assessed  Endo: not assessed  MS: not assessed  Skin: otherwise negative       Objective:      Gen: pleasant, NAD, appears as stated age  Eyes (lids/conjunctiva): examined  Head: examined  Scalp: examined  Lips: examined  Neck: examined  Chest/breasts/axillae: examined  Back: examined  Abdomen: examined  Buttocks: examined  L upper extremity: examined  R upper extremity: examined  L lower extremity: examined  R lower extremity: examined  Digits/nails: examined  All areas examined were within normal limits with the following exceptions:  - R axilla with pink waxy papule with milia-like cysts on dermoscopy  - Multiple tan to Monie stuck-on waxy papules and plaques on face, trunk, extremities  - Multiple [place] even macules with moth-eaten border on sun-exposed areas  - Bright red papules scattered on trunk and extremities  - Multiple pink to Martelle macules and papules scattered on face, trunk, extremities. Symmetric, uniform in color, regular borders Assessment/Plan:     1. Favor SK - R axilla  - Patient reassured lesion appears benign today  - Cont monitoring, return for evaluation if changing    2. SKs  3. Lentigines  4. Cherry angiomas  - Reassurance    5. Benign-appearing nevi  - cont to monitor  - discussed ABCDEs  A: Asymmetry; one half does not look like the other half  B: Border; irregular, scalloped or poorly defined border  C: Color; varied from one area to another; shades of tan, Daher, black and sometimes white, red or blue  D: Diameter; while melanomas are usually greater than 6mm (the size of a pencil eraser) when diagnosed, they can be smaller  E: Evolving; a mole or skin lesion that looks different from the rest or is changing in size, shape or color  - sun protection emphasized      RTC 12 months for general skin exam or sooner if new or changing lesions    Author:  Tye Savoy 06/01/2019 4:15 PM

## 2019-06-04 MED ORDER — YUVAFEM 10 MCG VA TABS
ORAL_TABLET | 1 refills | Status: AC
Start: 2019-06-04 — End: ?

## 2019-06-07 ENCOUNTER — Ambulatory Visit: Payer: BLUE CROSS/BLUE SHIELD

## 2019-07-20 ENCOUNTER — Ambulatory Visit: Payer: MEDICARE

## 2019-07-21 ENCOUNTER — Ambulatory Visit: Payer: BLUE CROSS/BLUE SHIELD

## 2019-08-09 ENCOUNTER — Ambulatory Visit: Payer: BC Managed Care – POS

## 2019-10-11 MED ORDER — ACYCLOVIR 400 MG PO TABS
400 mg | ORAL_TABLET | Freq: Two times a day (BID) | ORAL | 1 refills | Status: AC
Start: 2019-10-11 — End: ?

## 2019-11-02 ENCOUNTER — Telehealth: Payer: MEDICARE

## 2019-11-02 NOTE — Telephone Encounter
Called pt and left a voicemail to reschedule, as she was placed in a consult slot and needs to me moved at a later time. Lares: if she calls back please transfer to me so I may assist.     Thank you,     Wanda Plump

## 2019-11-03 ENCOUNTER — Ambulatory Visit: Payer: BLUE CROSS/BLUE SHIELD

## 2019-11-05 ENCOUNTER — Ambulatory Visit: Payer: PRIVATE HEALTH INSURANCE

## 2019-11-07 ENCOUNTER — Ambulatory Visit: Payer: BC Managed Care – POS

## 2019-11-07 DIAGNOSIS — G4733 Obstructive sleep apnea (adult) (pediatric): Secondary | ICD-10-CM

## 2019-11-09 DIAGNOSIS — E349 Endocrine disorder, unspecified: Secondary | ICD-10-CM

## 2019-11-10 NOTE — Addendum Note
Addended by: Jacques Navy RHEE on: 11/09/2019 06:00 PM     Modules accepted: Orders

## 2019-11-13 ENCOUNTER — Ambulatory Visit: Payer: PRIVATE HEALTH INSURANCE

## 2019-11-15 ENCOUNTER — Telehealth: Payer: BLUE CROSS/BLUE SHIELD

## 2019-11-15 ENCOUNTER — Ambulatory Visit: Payer: PRIVATE HEALTH INSURANCE

## 2019-11-15 ENCOUNTER — Inpatient Hospital Stay: Payer: BC Managed Care – POS

## 2019-11-15 ENCOUNTER — Ambulatory Visit: Payer: BLUE CROSS/BLUE SHIELD

## 2019-11-15 DIAGNOSIS — Z1211 Encounter for screening for malignant neoplasm of colon: Secondary | ICD-10-CM

## 2019-11-15 DIAGNOSIS — K219 Gastro-esophageal reflux disease without esophagitis: Secondary | ICD-10-CM

## 2019-11-15 DIAGNOSIS — Z01812 Encounter for preprocedural laboratory examination: Secondary | ICD-10-CM

## 2019-11-15 DIAGNOSIS — Z23 Encounter for immunization: Secondary | ICD-10-CM

## 2019-11-15 NOTE — Telephone Encounter
MPU Procedure Checklist:     _0   COVID Order has been pended and sent to MD performing procedure to sign.     _1  Open access patient- Pharmacy on file has been confirmed and bowel prep medication has been pended.    Prep instructions have been delivered to patient via:  _2  Email to [email or web address]  _4  Fax to ____________  _5  Mail to Address on file  _6  Sent via letter on Mychart  _7  Preps provided by the office      Encounter sent to FCU team if the following applies:  _8  Procedure scheduled on:  _9  Changes have been made to current procedure scheduled.       MAC Script    Patient has been advised of method of anesthesia that will be used in this procedure.  If MAC used, MAC script has been advised.  If IVCS used, pt given option of MAC.     _10  Pt has been offered all options and is requesting to be scheduled with MAC. Patient will pay $200 if authorization is denied.    _11  Pt has been scheduled in [place] on sedation day (if insurance applicable).      _12  Patient has requested to be scheduled with conscious sedation in one of our community Medical procedure units.

## 2019-11-15 NOTE — Patient Instructions
-  schedule upper endoscopy with [facility]  -continue taking omeprazole 20mg  once daily. Take on an empty stomach and wait 30-60 min prior to eating or drinking anything other than water.   -avoid eating 2-3hrs before bed  -avoid eating trigger foods: caffeine, chocolate, alcohol, spicy foods, citrus foods  -elevate torso to 45 degree angle when sleeping  -repeat colonoscopy is due in 2027

## 2019-11-15 NOTE — Telephone Encounter
Reply by: Shena Vinluan A Tyishia Aune  LVM for Pt to call back and schedule GI procedure. MC SENT

## 2019-11-15 NOTE — Telephone Encounter
LVM for Pt to call back and schedule GI procedure. MC SENT

## 2019-11-15 NOTE — Consults
Gastroenterology Consult   ATTENDING: Lorin Picket A. Zena Vitelli   PATIENT: Robin Spencer  MRN: [id number]  DOB: [birth year]  DATE OF SERVICE: 11/15/2019  REFERRING PROVIDER: Self-Referral  PRIMARY CARE PROVIDER: Pregler, Weyman Croon., MD    REASON FOR REFERRAL: GERD     Subjective:     Chief Complaint:  Robin Spencer is a 68 y.o. female with OSA, PACs, HSV-2, osteoporosis, fibromyalgia, hx of breast CA on tamoxifen who presents for evaluation of chest pains. Initially patient thought her pain was due to her heart and saw a cardiologist. She underwent heart monitor, TTE and pulse oximetry with only PACs being found. She was then told it might be related to acid reflux. She has been taking omeprazole for the past month which she feels has helped signifcant.  Patient states she feels like she gets epigastric and chest pain that feels like a hunger pain or a fluttering in her chest. She also endorses that she changed her diet (oatmeal, potatoes, sweet potatoes) during this time which she also thinks may have helped.     Review of her most recent colonoscopy shows x2 hyperplastic polyps were removed in 2017.     Past Medical & Surgical History:  Patient Active Problem List   Diagnosis   ??? Osteoporosis   ??? Eye allergies   ??? Genital HSV   ??? PPD positive   ??? Palpitations   ??? OA (osteoarthritis)   ??? Visit for preventive health examination   ??? Hearing loss, right   ??? Seborrheic dermatitis   ??? Menopause   ??? BPPV (benign paroxysmal positional vertigo)   ??? Fibroid   ??? Cataract, nuclear sclerotic, both eyes   ??? Status post LASIK surgery of both eyes   ??? Breast pain   ??? Invasive ductal carcinoma of left breast, ER+PR+HER2-   ??? Immune to measles   ??? Syncope and collapse   ??? Increased PTH level   ??? Vitreous floater, bilateral   ??? Asymptomatic varicose veins   ??? Rash, Intermittent, noted after colds   ??? Fibromyalgia   ??? Mucous cyst of digit of left hand   ??? Other fracture of right great toe, initial encounter for closed fracture   ??? OSA (obstructive sleep apnea)      Past Medical History:   Diagnosis Date   ??? Anxiety    ??? Breast cancer (HCC/RAF)    ??? Eczema    ??? Fibroids    ??? GERD (gastroesophageal reflux disease)     esophagitis   ??? History of migraine headaches    ??? History of radiation therapy 04/2016    L breast   ??? OSA (obstructive sleep apnea) 11/07/2019   ??? Otosclerosis    ??? Post-operative nausea and vomiting    ??? Rectal fissure    ??? Vitreous detachment      Past Surgical History:   Procedure Laterality Date   ??? BREAST BIOPSY  1986    Fibroadenoma, ductal hyperplasia   ??? Laparoscopy for subfertility     ??? Left finger cyst removal x 2     ??? Stapedectomy, right         Relevant Family History:  (yes if checked)  []   Family history of colorectal cancer    []   Family history of GI cancer (not CRC)   []   Family history of IBD   []   Family history of celiac disease    Relevant Social History:  EtOH ; Tobacco ; Drugs  MEDICATIONS:     No outpatient medications have been marked as taking for the 11/15/19 encounter (Telemedicine) with Orest Dikes., MD.         Allergies :    is allergic to penicillins; menadiol sodium diphosphate; other; and acetaminophen.    PHYSICAL EXAM    There were no vitals filed for this visit.   There is no height or weight on file to calculate BMI.    System Check if examined and normal Positive or additional negative findings   Constit  [x]  General appearance - normal     Eyes  [x]  Conjuctivae clear       HENMT  [x]  Normal Lips/teeth/gums, oropharynx, head     Neck  [x]  Inspection/palpation; thyroid normal      Resp  [x]  Effort good. Clear to auscultation bilaterally.     CV  []  Normal Rhythm/rate without murmur     Abdomen  []  Abdomen soft and NT, no rebound/guarding   []  Active BS 4 quadrants   []  No appreciable flank or shifting dullness   []  No hepatosplenomegaly   []  No umbilical hernia    Rectal   []  No external hemorrhoids   []  No masses palpated on DRE   []  Shaler stool in vault   []   Appropriate resting and squeeze pressure   []   Appropriate bear down maneuver      MSK  [x]  Grossly normal strength, ROM     Neuro   [x]  Grossly normal      Psychiatric  [x]   Oriented to time, place and person   [x]   Appropriate insight/judgement & mood/affect                Lab Review:  Lab Results   Component Value Date    WBC 5.37 05/12/2019    HGB 13.5 05/12/2019    HCT 40.2 05/12/2019    MCV 93.7 05/12/2019    PLT 228 05/12/2019     Lab Results   Component Value Date    CREAT 0.64 05/12/2019    BUN 24 (H) 05/12/2019    NA 139 05/12/2019    K 4.6 05/12/2019    CL 101 05/12/2019    CO2 27 05/12/2019    ALT 26 05/12/2019    AST 23 05/12/2019    ALKPHOS 56 05/12/2019    BILITOT 0.3 05/12/2019    ALBUMIN 4.7 05/12/2019     Lab Results   Component Value Date    SRWEST 6 09/23/2016    CRP <0.3 09/23/2016     Lab Results   Component Value Date    INR 1.0 12/28/2015    VITAMINB12 1,123 (H) 09/23/2016    FOLATE 20.6 06/26/2016     No results found for: HELPYLAB, HELPYLAGSTL      Imaging:       GI Studies:  Colonoscopy 10/08/15- The quality of the preparation was good.  ? A 4-5 mm flat polyp was biopsy removed from 25 cm.  ? A 3-4 mm flat polyp and a 2 mm flat polyp was biopsy removed from 15 cm.  ? No other polyps, masses, diverticula or vascular ectasia was seen on careful withdrawal of the instrument.  ? Turnaround examination in the rectum revealed internal hemorrhoids.   A. LARGE INTESTINE, POLYP AT 30 CM (BIOPSY):   - Fragments of hyperplastic polyp   - Deeper sections examined  ?  B. LARGE INTESTINE, TWO POLYPS AT 15 CM (BIOPSY):   -  Fragments of hyperplastic polyp     Colonoscopy 05/28/04- normal    Medical Decision Making addressed on 11/15/2019:      Number and Complexity of Problems Addressed at the Encounter:   []   1 or more chronic illness with exacerbation, progression, or side effects of treatment  []   2 or more stable chronic illness  [x]   1 undiagnosed new problem with uncertain diagnosis  []   1 acute illness with systemic symptoms []   1 acute complicated injury     Review of Data: I have  [x]  Reviewed/ordered ? 3 unique laboratory, radiology, and/or diagnostic tests noted    []  Reviewed prior external notes and incorporated into patient assessment as noted  [x]  I have independently interpreted test performed by other physician(s) as noted   []  Discussed management or test interpretation with external provider(s) as noted       Risk of Complication and or Morbidity or Mortality of Patient Management including Social Determinants of Health:   [x]   I deem the above diagnoses to have a risk of complication, morbidity or mortality of Moderate   []  The diagnosis or treatment of said conditions is significantly limited by social determinants of health as noted.     Visit Diagnoses  / Problems addressed on 11/15/2019:     Encounter Diagnoses   Name Primary?   ??? Gastroesophageal reflux disease without esophagitis Yes   ??? Colon cancer screening           Assessment & Plan :    Robin Spencer is a 69 y.o. year old female with a past medical history significant for OSA, PAC, HSV-2, osteoporosis, fibromyalgia, hx of breast CA on tamoxifen who presents with atypical chest pain likely due to GERD.     1. GERD  -continue taking omeprazole 20mg  once daily. Take on an empty stomach and wait 30-60 min prior to eating or drinking anything other than water.   -avoid eating 2-3hrs before bed  -avoid eating trigger foods: caffeine, chocolate, alcohol, spicy foods, citrus foods  -elevate torso to 45 degree angle when sleeping  -will plan for EGD w/ Bravo to confirm or disprove reflux given atypical presentation of symptoms  The risks of the procedure, alternatives tests / procedures, procedural details, and need to bring a friend or family member to the appointment were explained to the patient in detail. The EGD will be scheduled with IV MAC.  MAC anesthesia is medically necessary for this patient for the following higher risk situation(s):   ??? Prolonged or therapeutic endoscopic procedure requiring deep sedation    2.Colon cancer screening  -repeat in 2027     The above recommendation were discussed with the patient. The patient has all questions answered satisfactorily and is in agreement with this recommended plan of care.    Kanita Delage A. Ledora Delker, MD   11/15/2019 10:40 AM      Level 4 billing based on time:  45 min were spent on this patient visit included physician face-to-face time, preparing to see the patient, counseling and educating patient/family/caregiver, ordering medications/tests/procedures, referring and communicating with other healthcare professionals, documenting clinical information in the EHR and independently interpreting results and communicating results to patient/family/caregiver.      New patient: [id number] 45-59 min  Established patient: [id number] 30-39 min

## 2019-11-15 NOTE — Telephone Encounter
Hi,     Please reach out to schedule patient for EGD/BRAVO in [facility] with any provider on any given day that works for the patient.     Thank You!   Lesly Rubenstein

## 2019-11-16 ENCOUNTER — Ambulatory Visit: Payer: BLUE CROSS/BLUE SHIELD

## 2019-11-21 ENCOUNTER — Ambulatory Visit: Payer: BC Managed Care – POS

## 2019-11-28 ENCOUNTER — Institutional Professional Consult (permissible substitution): Payer: MEDICARE

## 2019-11-28 DIAGNOSIS — Z01812 Encounter for preprocedural laboratory examination: Secondary | ICD-10-CM

## 2019-11-29 ENCOUNTER — Telehealth: Payer: BLUE CROSS/BLUE SHIELD

## 2019-11-29 LAB — COVID-19 PCR: COVID-19 PCR: NOT DETECTED

## 2019-11-29 NOTE — Telephone Encounter
Message to Practice/Provider      Message: Pt called in to advise pre-op nurses can reach her at [phone]    Return call is not being requested by the patient or caller.    Patient or caller has been notified of the 24-48 hour processing turnaround time if applicable.

## 2019-11-30 MED ADMIN — SODIUM CHLORIDE 0.9 % IV SOLN: 10 mL/h | INTRAVENOUS | @ 17:00:00 | Stop: 2019-11-30 | NDC 00338004904

## 2019-11-30 MED ADMIN — PROPOFOL 200 MG/20ML IV EMUL: @ 17:00:00 | Stop: 2019-11-30 | NDC 63323026929

## 2019-11-30 MED ADMIN — LIDOCAINE HCL (CARDIAC) 100 MG/5ML IV SOSY: INTRAVENOUS | @ 17:00:00 | Stop: 2019-11-30 | NDC 76329339001

## 2019-11-30 NOTE — H&P
UPDATED H&P REQUIREMENT    For Ardyth Harps [facility] and Norval Gable [facility] and [facility]    WHAT IS THE STATUS OF THE PATIENT'S MOST CURRENT HISTORY AND PHYSICAL?   - There is no recent H&P <30 days.  Proceed to H&P Notes section for new H&P.     REFER TO MEDICAL STAFF POLICIES REGARDING PRE-PROCEDURE HISTORY AND PHYSICAL EXAMINATION AND UPDATED H&P REQUIREMENTS BELOW:    Ardyth Harps [facility] and [facility] and [facility] Medical Staff Policy 200 - For Patients Undergoing Procedures Requiring Moderate or Deep Sedation, General Anesthesia or Regional Anesthesia    Contents of a History and Physical Examination (H&P):    The H&P shall consist of chief complaint, history of present illness, allergies and medications, relevant social and family history, past medical history, review of systems and physical examination, and assessment and plan appropriate to the patient's age.    For Patients Undergoing Procedures Requiring Moderate or Deep Sedation, General Anesthesia or Regional Anesthesia:    1. An H&P shall be performed within 24 hours prior to the procedure by a qualified member of the medical staff or designee with appropriate privileges, except as noted in item 2 below.    2. If a complete history and physical was performed within thirty (30) calendar days prior to the patient???s admission to the Medical Center for elective surgery, a member of the medical staff assumes the responsibility for the accuracy of the clinical information and will need to document in the medical record within twenty-four (24) hours of admission and prior to surgery or major invasive procedure, that they either attest that the history and physical has been reviewed and accepted, or document an update of the original history and physical relevant to the patient's current clinical status.    3. Providing an H&P for patients undergoing surgery under local anesthesia is at the discretion of the Attending Physician.     4. When a procedure is performed by a dentist, podiatrist or other practitioner who is not privileged to perform an H&P, the anesthesiologist???s assessment immediately prior to the procedure will constitute the 24 hour re-assessment.The dentist, podiatrist or other practitioner who is not privileged to perform an H&P will document the history and physical relevant to the procedure.    5. If the H&P and the written informed consent for the surgery or procedure are not recorded in the patient's medical record prior to surgery, the operation shall not be performed unless the attending physician states in writing that such a delay could lead to an adverse event or irreversible damage to the patient.    6. The above requirements shall not preclude the rendering of emergency medical or surgical care to a patient in dire circumstances.

## 2019-11-30 NOTE — Procedures
PATIENT NAME:       Robin Spencer, Robin Spencer  DATE OF BIRTH:       [birth year]  RECORD NUMBER:      [id number]  DATE/TIME OF PROCEDURE:     11/30/2019 / 09:15:00 AM  ENDOSCOPIST:       Idelle Leech, MD  REFERRING PHYSICIAN:        FELLOW:           INDICATIONS FOR EXAMINATION:      GERD               PROCEDURE PERFORMED:             UPPER GI ENDOSCOPY - biopsy    MEDICATIONS:    MAC Anesthesia    PROCEDURE TECHNIQUE:  Patient's medications, allergies, past medical, surgical, social and family histories were reviewed and updated as appropriate. A discussion of informed consent was had with the patient and/or the patient's family prior to the procedure, including   sedation.  The alternatives, benefits and risks of  the procedure including but not limited to perforation, hemorrhage, infection, adverse drug reaction and aspiration were discussed    Procedure Details:     Informed consent was obtained for the procedure, including sedation.  Risks of perforation, hemorrhage, infection, adverse drug reaction and aspiration were discussed. The patient was placed in position.  Based on the pre-procedure assessment, including   review of the patient's medical history, medications, allergies, and review of systems, the patient had been deemed to be an appropriate candidate for conscious sedation; the patient was therefore sedated with the medications listed.  The patient was   monitored continuously with pulse oximetry, blood pressure monitoring, and direct observations.      The [id number] #[id number] was introduced and passed without difficulty to second part of duodenum. A careful inspection was made as the endoscope was withdrawn.    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  Cold forceps biopsies obtained.  Non-obstructing Schatzki ring at the EGJ at 35cm.  No erosive esophagitis or Barrett's esophagus.  1-cm hiatal hernia.  Bravo pH probe placed   at 29cm.  Stomach: normal.  Duodenum: normal.    ESTIMATED BLOOD LOSS:   None     DIAGNOSIS:  1. Congenital inlet patches of the esophagus.  2. Hiatal hernia and Schatzki ring.  3. Bravo pH probe placed.    RECOMMENDATIONS:  Follow-up pH test and pathology results.            This electronic signature authenticates all electronic and/or handwritten documentation, including orders, generated by the signer during the episode of care contained in this record.  11/30/2019 10:09:21 AM By Idelle Leech

## 2019-11-30 NOTE — H&P
GI PRE-PROCEDURAL HISTORY & PHYSICAL    History of Present Illness:  Robin Spencer is a 69 y.o.female who is here for EGD for GERD.    Past Medical History:  Patient Active Problem List   Diagnosis   ??? Osteoporosis   ??? Eye allergies   ??? Genital HSV   ??? PPD positive   ??? Palpitations   ??? OA (osteoarthritis)   ??? Visit for preventive health examination   ??? Hearing loss, right   ??? Seborrheic dermatitis   ??? Menopause   ??? BPPV (benign paroxysmal positional vertigo)   ??? Fibroid   ??? Cataract, nuclear sclerotic, both eyes   ??? Status post LASIK surgery of both eyes   ??? Breast pain   ??? Invasive ductal carcinoma of left breast, ER+PR+HER2-   ??? Immune to measles   ??? Syncope and collapse   ??? Increased PTH level   ??? Vitreous floater, bilateral   ??? Asymptomatic varicose veins   ??? Rash, Intermittent, noted after colds   ??? Fibromyalgia   ??? Mucous cyst of digit of left hand   ??? Other fracture of right great toe, initial encounter for closed fracture   ??? OSA (obstructive sleep apnea)   ??? GERD (gastroesophageal reflux disease)       Past Surgical History:  Past Surgical History:   Procedure Laterality Date   ??? BREAST BIOPSY  1986    Fibroadenoma, ductal hyperplasia   ??? Laparoscopy for subfertility     ??? Left finger cyst removal x 2     ??? Stapedectomy, right         Allergies:  Penicillins, Menadiol sodium diphosphate, and Other    Medications:  Medications that the patient states to be currently taking   Medication Sig   ??? ACYCLOVIR 400 mg tablet TAKE 1 TABLET (400 MG TOTAL) BY MOUTH TWO (2) TIMES DAILY.   ??? BIOTIN PO Take 1 tablet by mouth daily .     ??? CALCIUM CITRATE PO Take 630 mg by mouth daily.   ??? Cholecalciferol (VITAMIN D3) 5000 units CAPS Take 1 capsule by mouth daily.   ??? Homeopathic Products (TRAUMEEL) OINT Apply 4 g topically daily.   ??? MAGNESIUM CITRATE PO Take 150 mg by mouth two (2) times daily.   ??? Omega-3 Fatty Acids (FISH OIL PO) Take 1,400 mg by mouth two (2) times daily.   ??? TAMOXIFEN 20 mg tablet TAKE 1 TABLET BY MOUTH EVERY DAY   ??? UNABLE TO FIND Med Name: [place] supplement- Epimedium, Tumeric, Japanese, Fleeceflower, EPA/DHA .   ??? YUVAFEM 10 MCG vaginal tablet PLACE 1 TABLET (10 MCG TOTAL) VAGINALLY TWO (2) TIMES A WEEK.       Family History:  Family History   Problem Relation Age of Onset   ??? Hypertension Father    ??? Stroke Father    ??? Hyperlipidemia Father    ??? Depression Father    ??? Prostate cancer Father 17   ??? Hypertension Mother    ??? Asthma Mother    ??? Endometrial cancer Mother 44   ??? Hyperthyroidism Mother    ??? Prostate cancer Brother 51   ??? Breast cancer Neg Hx    ??? Ovarian cancer Neg Hx    ??? Malignant hypertension Neg Hx        Social History:  Social History     Tobacco Use   ??? Smoking status: Never Smoker   ??? Smokeless tobacco: Never Used   Substance  Use Topics   ??? Alcohol use: No     Alcohol/week: 0.0 oz   ??? Drug use: No       Review of Systems: A 14-point review of systems has been obtained.  Pertinent positive and negative responses are noted in the history of present illness.  All other review of systems are negative.    Physical Exam:    Vitals Signs:   Last Recorded Vital Signs:    11/30/19 0852   BP:    Pulse:    Resp:    Temp: 37.2 ???C (98.9 ???F)   SpO2:        Airway Assessment:  [place] Visualized: [x]  Yes  []  Partially  []  Not at all  Neck ROM: [x]  Normal  []  Limited    Normal Exam                                     Additional Findings  [x]  General  [x]  HEENT  [x]  Heart / CVS  [x]  Lungs / Respiratory  [x]  Abdomen      Diagnostic Data:     Lab Results   Component Value Date    HGB 13.5 05/12/2019    PLT 228 05/12/2019     Lab Results   Component Value Date    PT 9.7 12/28/2015    INR 1.0 12/28/2015    APTT 27.7 12/28/2015       Level of sedation intended for procedure: [x]  MAC  []  IVCS   []  None  ASA Classification: ASA 3 - Patient with moderate systemic disease with functional limitations    I have determined Robin Spencer to be an appropriate candidate to undergo the above procedure(s) with the planned level of sedation.    Crista Elliot, MD  11/30/2019 9:34 AM

## 2019-12-01 NOTE — Procedures
A 69 year old female came today for a 96 hr Bravo placement  off / on medication through EGD.      Patient has been experiencing the following symptoms: belching and early satiety.  Patient takes Omeprazole antiacid medication. Patient stopped taken antiacid medication 6 days ago prior to the test as instructed.     Patient verbalized understanding of the [place] study including understanding of placement, recording device, care instructions for the recording device, and all discharge instructions re: diary-keeping & when to return diary & recorder.  Teaching was given by Oneta Rack RN. The Bravo recorder used SN # [id number].  The Bravo used was ID # [id number], Lot # [id number], and expires 04/06/2021.  The Bravo was a calibration free model bravo.      Dr.Ghassemi performed an EGD and the z-line was noted to be at 35 cm.  The Bravo was then placed by provider to a depth of 29 cm from incisors.  Suction to 80 mm of Hg was applied for 45 seconds.  The location of the Bravo capsule was verified by reintroduction of EGD scope.  Start time for the Bravo pH study was 0942 and starting pH was 5.4.     Patient tolerated procedure well.

## 2019-12-01 NOTE — Patient Instructions
Thank you for visiting [place] today in [place].     Please check out at the front desk and schedule your follow-up appointment with Dr. Sheppard Coil in 12 months. It is very important to follow up as instructed because it allows [place] to provide you the best care possible.      You will be due for follow up bilateral mammogram with ultrasound on or after December 01, 2020 if your imaging today is normal.  Please call the Women's Imaging Department at [phone] to schedule the mammogram at your convenience. The Women's Imaging Department will obtain insurance authorization once you have scheduled the appointment.      If you have any questions or concerns between now and your next visit, please feel free to contact us at [phone].     We look forward to seeing you next time.

## 2019-12-01 NOTE — Progress Notes
BREAST FOLLOW-UP OFFICE VISIT NOTE    ???PATIENT: Robin Spencer  ???MRN: [id number]  ???DOB: 12-10-1950    ???DATE OF SERVICE: 12/02/2019    ???REFERRING PRACTITIONER: Cyril Loosen., MD  ???PRIMARY CARE PROVIDER: Pregler, Weyman Croon., MD  ???RESIDENT PHYSICIAN: Kennedy Bucker, MD  ???ATTENDING PHYSICIAN: Kennedy Bucker, MD     ???CHIEF COMPLAINT: 3.5 yr f/u since completion of RT    ???IDENTIFYING DATA: Robin Spencer is a 69 y.o. female with a pT2N0(i-), Grade 1, ER+, PR-, Her2- IDC of the left breast s/p BCS+SNB on 02/29/16. She completed RT 04/24/16, on tamoxifen.    ???RADIATION HISTORY: She received 42.56 Gy in 16 fractions to the breast followed by a boost of 10 Gy in 4 fractions to the lumpectomy cavity. Radiation was completed on 04/24/2016.     ???DATA OF INTERVAL HISTORY:  ??? Breast-FU Data Fields ~ Data ~ Comments    ??? Most recent imaging date (m/d/yyyy) ~ 12/02/19 bilat mm and [place]  12/30/17 breast MRI    ~     ??? Local Failure (LF) ~  No Local Failure ~     ??? If LF, date (m/d/yyyy) ~  ~     ??? Regional failure (RF) ~  No Regional Failure ~     ??? If RF, date (m/d/yyyy) ~  ~     ??? Distant Mets (DM) ~  No Distant Metastases ~     ??? If DM, date (m/d/yyyy) ~  ~     ??? Any failure within RT field? ~  No ~     ??? RT related complications ~  No ~     ??? Complications ~  ~     ??? Highest complication grade  (CTCAE) ~  ---- ~  ???     Subjective:       Interval History:   Robin Spencer returns for routine posttreatment follow up since completing radiation approximately 3.5 yrs ago.    Today, she is feeling well. Regarding the treated left breast, she reports no changes since last f/u.  She is happy w cosmesis but reports that the left nipple areola complex continues to look paler on the left and the treatedleft  breast remains slightly fuller compared to the right breast. No breast pain but she does endorse left axillary TTP. No skin textural change.  She denies cough, shortness of breath, swelling in the arm, headaches, bone pain. Interval medical issues include new dx of sleep apnea and she had an EGD at [facility] while in town this week.  She moved to [place] 5 mths ago and has been thrilled with the move.      On tamoxifen and following with Dr Maye Hides, last seen 12/10/18 and seeing her today.     12/02/19 bilat mm and [place]: results pending    MRI breast 12/30/17:  FINDINGS:   ???  GENERAL:  -Breast density: Extreme fibroglandular tissue  -Background parenchymal enhancement: Minimal  ???  RIGHT BREAST:  -Small enhancing oval mass with circumscribed margins in the right lateral breast (post-contrast series, image 121) is stable in comparison to 02/05/2016 and benign in appearance  -No suspicious masses, lymph nodes, or areas of abnormal enhancement  ???  LEFT BREAST:  -Postsurgical changes at 12:00 without abnormal enhancement  -No suspicious masses, lymph nodes, or areas of abnormal enhancement  ???  ADDITIONAL:  -None.  ???  ???  IMPRESSION:   ???  RIGHT BREAST: CATEGORY 1 -  NEGATIVE.    ???  LEFT BREAST: No abnormal enhancement in the region of the post-surgical scar. CATEGORY 2 - BENIGN FINDINGS.  ???  OVERALL ASSESSMENT: CATEGORY 2 - BENIGN FINDINGS. Recommend routine annual mammography.                PERTINENT PAST MEDICAL HISTORY:   Past Medical History:   Diagnosis Date   ??? Anxiety    ??? Breast cancer (HCC/RAF)    ??? Eczema    ??? Fibroids    ??? GERD (gastroesophageal reflux disease)     esophagitis   ??? History of migraine headaches    ??? History of radiation therapy 04/2016    L breast   ??? OSA (obstructive sleep apnea) 11/07/2019   ??? Otosclerosis    ??? Post-operative nausea and vomiting    ??? Rectal fissure    ??? Vitreous detachment      Past Surgical History:   Procedure Laterality Date   ??? BREAST BIOPSY  1986    Fibroadenoma, ductal hyperplasia   ??? Laparoscopy for subfertility     ??? Left finger cyst removal x 2     ??? Stapedectomy, right         GYN HISTORY:   OB History   Obstetric Comments   Menses started: 68   Gravida: 0   Para:  0   Age of first live birth: n/a Breast feed: n/a    Contraceptives/Type:  Y, 18-30    Menopause reached at: 38   Hormone replacement/Type: Y, 67-64    Fertility Tx: Y        ALLERGIES:   Allergies as of 12/02/2019 - Review Complete 11/30/2019   Allergen Reaction Noted   ??? Penicillins Other (See Comments) and Rash 01/08/2012   ??? Menadiol sodium diphosphate Rash 03/19/2016   ??? Other  02/29/2016     MEDICATIONS:   Current Outpatient Medications   Medication Sig   ??? ACYCLOVIR 400 mg tablet TAKE 1 TABLET (400 MG TOTAL) BY MOUTH TWO (2) TIMES DAILY.   ??? BIOTIN PO Take 1 tablet by mouth daily .     ??? CALCIUM CITRATE PO Take 630 mg by mouth daily.   ??? Cholecalciferol (VITAMIN D3) 5000 units CAPS Take 1 capsule by mouth daily.   ??? cyanocobalamin 500 mcg tablet Take 500 mcg by mouth daily.   ??? cyclobenzaprine 5 mg tablet Take 1 tablet (5 mg total) by mouth at bedtime. (Patient taking differently: Take 2.5 mg by mouth at bedtime .)   ??? Homeopathic Products (TRAUMEEL) OINT Apply 4 g topically daily.   ??? MAGNESIUM CITRATE PO Take 150 mg by mouth two (2) times daily.   ??? Omega-3 Fatty Acids (FISH OIL PO) Take 1,400 mg by mouth two (2) times daily.   ??? omeprazole 40 mg DR capsule    ??? TAMOXIFEN 20 mg tablet TAKE 1 TABLET BY MOUTH EVERY DAY   ??? triamcinolone 0.5% cream Apply topically as needed for.   ??? UNABLE TO FIND Med Name: [place]ed[place] supplement- Epimedium, Tumeric, Japanese, Fleeceflower, EPA/DHA .   ??? YUVAFEM 10 MCG vaginal tablet PLACE 1 TABLET (10 MCG TOTAL) VAGINALLY TWO (2) TIMES A WEEK.     No current facility-administered medications for this visit.        SOCIAL HX:   Social History     Socioeconomic History   ??? Marital status: Married     Spouse name: Not on file   ??? Number of children: Not  on file   ??? Years of education: Not on file   ??? Highest education level: Not on file   Occupational History   ??? Not on file   Social Needs   ??? Financial resource strain: Not on file   ??? Food insecurity     Worry: Not on file     Inability: Not on file   ??? Transportation needs     Medical: Not on file     Non-medical: Not on file   Tobacco Use   ??? Smoking status: Never Smoker   ??? Smokeless tobacco: Never Used   Substance and Sexual Activity   ??? Alcohol use: No     Alcohol/week: 0.0 oz   ??? Drug use: No   ??? Sexual activity: Yes     Partners: Male     Birth control/protection: None   Lifestyle   ??? Physical activity     Days per week: Not on file     Minutes per session: Not on file   ??? Stress: Not on file   Relationships   ??? Social Wellsite geologist on phone: Not on file     Gets together: Not on file     Attends religious service: Not on file     Active member of club or organization: Not on file     Attends meetings of clubs or organizations: Not on file     Relationship status: Not on file   Other Topics Concern   ??? Not on file   Social History Narrative    Patient is married, and has two children. She is of Ashkenazi Agricultural engineer.        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 Hypertension Father    ??? Stroke Father    ??? Hyperlipidemia Father    ??? Depression Father    ??? Prostate cancer Father 58   ??? Hypertension Mother    ??? Asthma Mother    ??? Endometrial cancer Mother 62   ??? Hyperthyroidism Mother    ??? Prostate cancer Brother 48   ??? Breast cancer Neg Hx    ??? Ovarian cancer Neg Hx    ??? Malignant hypertension Neg Hx       Objective:      Physical Exam:  There were no vitals taken for this visit.   GENERAL: The patient is a well-developed, well-nourished, female in no acute distress.  HEENT: Normocephalic and atraumatic. Anicteric sclerae. Mucous membranes are moist. Hearing is intact bilaterally.   NECK: supple, nl ROM  BREASTS: Visualization reveals no residual hyperpigmentation on left and left NAC is mildly hypopigmented vs right.  Left breast slightly larger than right. Palpation reveals no discrete nodulrity but bilat breasts are very dense centrally.  No appreciable fibrosis.  LYMPHATICS: No sclav/iclav/axillary LAD palpated bilaterally  CHEST: Non labored breathing.   MUSCULOSKELETAL SYSTEM: No upper extremity lymphedema. Shoulder abduction and external rotation fully intact.   NEUROLOGIC EXAM: Patient is alert and oriented. Patient ambulates with a normal gait.    Lab Review / Pathology / Radiology:     See HPI     Assessment:  Robin Spencer is a 69 y.o. female with a pT2N0(i-), Grade 1, ER+, PR-, Her2- IDC of the left breast s/p BCS+SNB on 02/29/16 followed by radiation, on tamoxifen. NED     Plan/ Recommendation:      - On tamoxifen, following w Dr. Maye Hides, seeing her today   - Bilateral mammogram and ultrasound done today 12/02/19, results pending; supplemental U/S due to extremely dense breast tissue and mammo occult cancer  - I explained that I would be happy to follow up with her at any time in the future in person, by phone, or by email should she have any questions or concerns. She has moved to [facility] but tentatively would like to return in 1 yr w same day imaging and f.u.  Will contact her w today's results and finalize a f/u plan accordingly.    Thank you kindly for allowing me to participate in the care of this lovely patient.  Low level of Medical Decision Making: (at least 2 of below)  [x]  Low complexity of problems addressed. Cancer of overlapping sites of left female beast. This is a stable chronic illness.  [x]  Limited amount/complexity of data personally reviewed and analyzed by me (at least 1 of below)   ? [x]  Category 1 (tests, documents): review of prior external notes [providers/dates: Dr Maye Hides Sept 2020], review of test results (mammogram [dates: 12/02/19], ultrasound [dates: 12/02/19])  ? [x]  Category 2: assessment requiring an independent historian  [x]  Low (but more than minimal) risk of complications/morbidity of additional testing or treatment        Total time: I spent 25 minutes on the day of service, which included:  [x]  Face-to-face and non-face-to-face time spent with the patient  [x]  Preparing to see the patient (e.g. review of tests, imaging)  [x]  Obtaining and/or reviewing separately obtained history  [x]  Performing a medically appropriate exam and evaluation  [x]  Counseling and education with the patient and family/caregiver  []  Ordering medications, tests, procedures  []  Referring or communicating with other healthcare professionals  [x]  Documenting clinical information in the EHR  [x]  Independently interpreting results and communicating results to patient/family/caregiver         cc Pregler, Weyman Croon., MD  Cyril Loosen., MD    Author: Kennedy Bucker 12/01/2019 9:33 AM

## 2019-12-02 ENCOUNTER — Ambulatory Visit: Payer: BLUE CROSS/BLUE SHIELD

## 2019-12-02 ENCOUNTER — Ambulatory Visit: Payer: BC Managed Care – POS

## 2019-12-02 ENCOUNTER — Inpatient Hospital Stay: Payer: BC Managed Care – POS | Attending: Radiation Oncology

## 2019-12-02 ENCOUNTER — Inpatient Hospital Stay: Payer: MEDICARE | Attending: Radiation Oncology

## 2019-12-02 ENCOUNTER — Telehealth: Payer: BLUE CROSS/BLUE SHIELD

## 2019-12-02 DIAGNOSIS — C50812 Malignant neoplasm of overlapping sites of left female breast: Secondary | ICD-10-CM

## 2019-12-02 DIAGNOSIS — Z170 Estrogen receptor positive status [ER+]: Secondary | ICD-10-CM

## 2019-12-02 LAB — Tissue Exam

## 2019-12-02 NOTE — Telephone Encounter
Pt stated no follow ups are due at this time.    Thank you,    Wanda Plump

## 2019-12-02 NOTE — Progress Notes
BREAST MEDICAL ONCOLOGY PROGRESS NOTE     Patient name: Robin Spencer  PCP: Pregler, Weyman Croon., MD  Referring provider: Self-Referral      MRN: [id number]  DOB: 1950-12-25  Age: 69 y.o.      Reason for referral: breast cancer  Chief Complaint   Patient presents with   ??? Breast Cancer     History was obtained from the patient and medical records.  History of the Present Illness   Robin Spencer is a 69 y.o. post-menopausal female with ER+PR+HER2- (IHC) invasive ductal carcinoma of the left breast, s/p lumpectomy with sentinel lymph node excision 02/29/16, s/p adjuvant XRT 03/27/16-04/25/16, now on tamoxifen.     Robin Spencer presented with acute RIGHT breast pain. On 01/15/16 bilateral diagnostic breast ultrasound, she was found to have an irregular mass measuring 5x6x54mm at 12 o'clock, 6cm from the nipple in the LEFT breast. There was no axillary lymphadenopathy.    Robin Spencer underwent biopsy of the left breast mass on 01/22/16. On pathology, she was found to have an invasive ductal carcinoma, grade 1 with mBRS 5/9, in a background of DCIS (micropapillary and cribriform, no necrosis, low nuclear grade). ER+ (95% 3+), PR+ (40% 1-2+), HER2- (IHC 1+, FISH with HER2/CEN17 ratio 1.0), Ki67 1-2%.     Progress / Interval Events   02/29/16 left lumpectomy with sentinel lymph node biopsy. Invasive ductal carcinoma, grade 1 with mBRS 5/9, 2.8cm. ER+ (90%, 2-3+), PR+ (0%), HER2- (IHC 1+, HER2/CEP17 1.0 with 2.0 copies of HER2 per cell), Ki67 <1%. 2.1cm DCIS (clinging and micropapillary type with focal central necrosis. 0/2 lymph nodes involved. No LVI. pT2N0(i-).  03/27/16-04/18/16 Radiation therapy.   04/16/16 Presents to discuss adjuvant hormonal therapy. Started tamoxifen because continues on hormonal therapy.   08/13/16 Follow up on tamoxifen x3 months. Generally doing well except for hot flashes every 50 minutes. Vaginal atrophy much improved on DHEA. Some residual pain in the left breast.   12/11/16 mammogram + U/S, screening: no evidence of malignancy.  02/25/17 Tolerating tamoxifen well. Worried about her son, who is currently hospitalized for diarrheal illness.  11/30/18 screening tomo and ultrasound. Benign.  12/10/18 diagnosed with fibromyalgia based on painful trigger points, but she doubts this diagnosis.  12/02/19 doing well on tamoxifen.     Review of Systems   Constitutional: No fevers or chills. No weight loss or appetite changes.   Eyes: No recent blurry vision, double vision or visual changes.  ENT: No recent sinus pain or congestion. No sore throat or mouth sores.   Neck: No neck pain or stiffness.  Breasts: healed well. Breast pain, less than prior but persistent.  Cardiovascular: No chest pain or pressure, no palpitations. No swelling.  Pulmonary: No shortness of breath, no cough or wheezing.   Gastrointestinal: No abdominal pain. No nausea/vomiting. No diarrhea or constipation. No melena or BRBPR.   Genitourinary: No urinary frequency, urgency. + dysuria. + pain with sexual intercourse.  Musculoskeletal: No joint pain, muscle pain or back pain.   Neurologic: No headaches. No numbness, tingling or weakness.   Dermatologic: No rash, pruritus or recent skin changes.   Hematologic: No abnormal bruising or bleeding.   Endocrine: No polyuria, polydipsia, heat or cold intolerance. + worsening hot flashes and difficulty sleeping.  Psychiatric: No depressive symptoms. No suicidal or homicidal ideation.     Medical History Surgical History   Past Medical History:   Diagnosis Date   ??? Anxiety    ??? Breast cancer (HCC/RAF)    ???  Eczema    ??? Fibroids    ??? GERD (gastroesophageal reflux disease)     esophagitis   ??? History of migraine headaches    ??? History of radiation therapy 04/2016    L breast   ??? OSA (obstructive sleep apnea) 11/07/2019   ??? Otosclerosis    ??? Post-operative nausea and vomiting    ??? Rectal fissure    ??? Vitreous detachment     Past Surgical History:   Procedure Laterality Date   ??? BREAST BIOPSY  1986    Fibroadenoma, ductal hyperplasia   ??? Laparoscopy for subfertility     ??? Left finger cyst removal x 2     ??? Stapedectomy, right          Family History Social History   The family history includes Asthma in her mother; Depression in her father; Endometrial cancer (age of onset: 18) in her mother; Hyperlipidemia in her father; Hypertension in her father and mother; Hyperthyroidism in her mother; Prostate cancer (age of onset: 38) in her brother; Prostate cancer (age of onset: 66) in her father; Stroke in her father. The patient  reports that she has never smoked. She has never used smokeless tobacco. She reports that she does not drink alcohol or use drugs.   Ob/Gyn History    OB History   Obstetric Comments   Menses started: 43   Gravida: 0   Para:  0   Age of first live birth: n/a    Breast feed: n/a    Contraceptives/Type:  Y, 18-30    Menopause reached at: 54   Hormone replacement/Type: Y, 50-64    Fertility Tx: Y    Occupational History   ??? Not on file     Social History     Social History Narrative    Patient is married, and has two children. She is of Ashkenazi Agricultural engineer.        Work: Retired from [employer] relations        Exercise:        Diet:        Transfusion:        Religion:    Son on the autism spectrum, has bulimia.      Medications     Allergies   Allergen Reactions   ??? Penicillins Other (See Comments) and Rash     Ampicillin, flesh rash in 1980s   ??? Menadiol Sodium Diphosphate Rash   ??? Other      [place] coloring on meds such as advil- not allergic to actual med- just the coloring     Current Outpatient Medications   Medication Sig   ??? ACYCLOVIR 400 mg tablet TAKE 1 TABLET (400 MG TOTAL) BY MOUTH TWO (2) TIMES DAILY.   ??? BIOTIN PO Take 1 tablet by mouth daily .     ??? CALCIUM CITRATE PO Take 630 mg by mouth daily.   ??? Cholecalciferol (VITAMIN D3) 5000 units CAPS Take 1 capsule by mouth daily.   ??? cyanocobalamin 500 mcg tablet Take 500 mcg by mouth daily.   ??? cyclobenzaprine 5 mg tablet Take 1 tablet (5 mg total) by mouth at bedtime. (Patient taking differently: Take 2.5 mg by mouth at bedtime .)   ??? Homeopathic Products (TRAUMEEL) OINT Apply 4 g topically daily.   ??? MAGNESIUM CITRATE PO Take 150 mg by mouth two (2) times daily.   ??? Omega-3 Fatty Acids (FISH OIL PO) Take 1,400 mg by mouth  two (2) times daily.   ??? omeprazole 40 mg DR capsule    ??? TAMOXIFEN 20 mg tablet TAKE 1 TABLET BY MOUTH EVERY DAY   ??? triamcinolone 0.5% cream Apply topically as needed for.   ??? UNABLE TO FIND Med Name: [place] supplement- Epimedium, Tumeric, Japanese, Fleeceflower, EPA/DHA .   ??? YUVAFEM 10 MCG vaginal tablet PLACE 1 TABLET (10 MCG TOTAL) VAGINALLY TWO (2) TIMES A WEEK.     No current facility-administered medications for this visit.      Objective   BP 118/72  ~ Pulse 81  ~ Temp 36.6 ???C (97.8 ???F) (Forehead)  ~ Resp 18  ~ Ht 162.6 cm  ~ Wt 57.8 kg (127 lb 6.4 oz)  ~ LMP  (LMP Unknown)  ~ SpO2 96%  ~ BMI 21.87 kg/m???    General: Appears well-developed, well-nourished and close to stated age.   Head: Normocephalic, atraumatic.  Eyes: PERRL without icterus.   ENT: Oropharynx is clear, mucus membranes are moist. No oral ulcers noted. Good dentition.  Neck: Supple. Trachea midline.   CV: No edema.   Chest: Respiratory effort appears normal.   Breasts: (prior: breasts appear normal, no suspicious masses, no skin or nipple changes or axillary nodes. S/p left mastectomy, well-healed.)  Musculoskeletal: No cyanosis. Extremities are warm and well-perfused.   Neurologic: Station appears normal.   Hematologic: No bruising, purpura or petechiae are noted.   Dermatologic: No rashes appreciated.   Psychiatric: Affect appropriate. Pleasant and conversant.   ECOG performance status: 0-Fully active, able to carry on all pre-disease performance without restriction  Laboratory   I have personally reviewed lab results. Labs are normal.  Lab Results   Component Value Date    WBC 5.37 05/12/2019    HGB 13.5 05/12/2019    HCT 40.2 05/12/2019    MCV 93.7 05/12/2019    PLT 228 05/12/2019    CREAT 0.64 05/12/2019    BUN 24 (H) 05/12/2019    NA 139 05/12/2019    K 4.6 05/12/2019    CL 101 05/12/2019    CO2 27 05/12/2019    ALT 26 05/12/2019    AST 23 05/12/2019    ALKPHOS 56 05/12/2019    BILITOT 0.3 05/12/2019    ALBUMIN 4.7 05/12/2019       Pathology   I have personally reviewed all available pathology reports   03/04/16 left breast lumpectomy  FINAL DIAGNOSIS   Date Value Ref Range Status   02/29/2016   Final      A.  BREAST, LEFT (WIRE LOCALIZED LUMPECTOMY):  - Invasive ductal carcinoma, grade 1, 2.8 cm in greatest diameter (4/9 consecutive levels by slice method)   Modified Bloom Richardson Score: 5 of 9      Tubule formation: 2      Nuclear pleomorphism: 2      Mitotic score: 1  - In situ carcinoma (DCIS): present, 2.1 cm in greatest diameter (3/9 consecutive levels by slice method), clinging and micropapillary type with focal central necrosis  - Lumpectomy surgical margins: negative for invasive or in situ carcinoma (see B-G for final margin status)   Invasive ductal carcinoma:     Lateral margin: 7 mm    Inferior margin: 7 mm   Ductal Carcinoma in situ:     Lateral margin: 4 mm    Inferior margin: 2 mm  - Lymph/vascular invasion: not identified  - Microcalcifications: not identified  - Pathologic staging: p2 (sn)N0(i-) See synoptic report for case details  -  Breast biomarkers: See Immunohistochemistry report below                                                                                                                                                                                                                                   B.  LEFT BREAST, ANTERIOR MARGIN (EXCISION):  - Fibroglandular breast tissue  - Negative for invasive or in situ malignancy    C.  LEFT BREAST, INFERIOR MARGIN (EXCISION):  - Fibroglandular breast tissue  - Negative for invasive or in situ malignancy    D.  LEFT BREAST, LATERAL MARGIN (EXCISION):  - Fibroglandular breast tissue  - Negative for invasive or in situ malignancy    E.  LEFT BREAST, SUPERIOR MARGIN (EXCISION):  - Fibroglandular and fatty breast tissue  - Negative for invasive or in situ malignancy    F.  LEFT BREAST, MEDIAL MARGIN (EXCISION):  - Fibroglandular breast tissue  - Negative for invasive or in situ malignancy    G.  LEFT BREAST, DEEP MARGIN (EXCISION):  - Fibroadipose tissue  - Negative for invasive or in situ malignancy    H.  SENTINEL LYMPH NODES, LEFT AXILLA (LYMPHADENECTOMY):  - Two lymph nodes, negative for malignancy (0/2)        IHC REPORT   Date Value Ref Range Status   02/29/2016   Final    Breast Biomarkers: Block A6    RESULT ER/PR   ESTROGEN RECEPTORS PROGESTERONE RECEPTORS   Antibody Clone SP1 Clone 636   %Tumor Staining 90% 0%   Intensity (1+ to 3+) 2-3+ NA   Leica Bond III with Refine Polymer Detection System, using Heat retrieval for 20 minutes with pH6 buffer. Clone SP1 Diluted to 1/50 and PR636 to 1/200.    ESTROGEN/PROGESTERONE IMMUNOHISTOCHEMICAL REPORT  Using appropriate positive and negative controls, the test for the presence of these hormone receptor proteins is performed by the immunoperoxidase method, and reported according to the 2009 CAP-ASCO Guidelines for Hormone Receptor testing. Tissue is fixed from 6-72 hours in 10% neutral buffered formalin. A positive ER or PR tumor shows greater than or equal to 1 percent of cells staining, and results are semi-quantitated as indicated above.      RESULT: HER-2/neu IHC assay (utilizing FDA-approved DAKO Hercep Test): J. Clin. Oncol 2013; 1-18, and Arch Pathol Lab Med 1610:9-60.     Test Score:   1+    HER2/neu:  No overexpression  0 no staining observed or membrane staining that is incomplete and is faint/barely perceptible and within ? 10% of tumor cells No overexpression   1+ Incomplete membrane staining that is faint/barely perceptible and within > 10% of tumor cells No overexpression   2+ circumferential membrane staining that is incomplete, and/or weak/moderate within > 10% of tumor cells or complete and circumferential membrane staining that is intense and within ? 10% of tumor cells   Equivocal   3+ circumferential membrane staining that is complete, intense, and within > 10% of tumor cells Overexpression     Note: FISH gene amplification testing. Please see separate addendum report.    Ki-67: <1%    Note: The immunoperoxidase stains reported above for ER, PR, and Ki-67 were developed and their performance characteristics determined by Department of Pathology & Laboratory Medicine, [facility], [place]. They have not been cleared or approved by the U.S. Food and Drug Administration, although such approval is not required for analyte-specific reagents of this type.    GENERAL IMMUNOHISTOCHEMISTRY REPORT    BLOCK:  H1/H2  FIXATIVE:  Formalin    ANTIBODY/PROBE: RESULT/COMMENT  CK7   Negative  PanKer   Negative     INTERPRETATION:  Negative for isolated tumor cells    Note: The immunoperoxidase stain reported above was developed and its performance characteristics determined by [facility].  It has not been cleared or approved by the U.S. Food and Drug Administration, although such approval is not required for analyte-specific reagents of this type.  Appropriate positive and negative controls are included for each case.         FISH REPORT   Date Value Ref Range Status   02/29/2016   Final    FINAL DIAGNOSIS: ERBB2 (HER2) FISH:  Negative for HER2 gene amplification     HER2/CEP17 ratio: 1.0   Average HER2 copy number per cell: 2.0     KARYOTYPE:    nuc ish(D17Z1,ERBB2)x2[20]      INTERPRETATION:  FISH analysis with the dual color probes specific for the centromere of chromosome 17 and the ERBB2 (HER2) gene (17q11.2) (multiplex1) was performed and examined by two independent technologists.  These studies showed no evidence of HER2 amplification in the 20 nuclei analyzed, per the FDA-approved criteria (package insert).    Average HER2 copy number: 2.0 signals/cell    D17Z1: Green signals 40  HER-2: Red signals  40  Ratio:  R/G   1.0      AMPLIFIED:  Dual-probe HER2/CEP17 ratio ? 2.0 with an average HER2 copy number ? 4.0 signals per cell   Dual-probe HER2/CEP17 ratio ? 2.0 with an average HER2 copy number < 4.0 signals/cell  Dual-probe HER2/CEP17 ratio < 2.0 with an average HER2 copy number ? 6.0 signals/cell    NOT AMPLIFIED: Dual-probe HER2/CEP17 ratio < 2.0 with an average HER2 copy number < 4.0 signals/cell    EQUIVOCAL: Dual-probe HER2/CEP17 ratio < 2.0 with an average HER2 copy number ? 4.0 and < 6.0 signals/cell    INDETERMINATE: NO FISH results due to technical issues (Inadequate specimen handling; Artifacts; Analytic testing failure); Recommended to send another specimen for testing to determine HER2 status.    References: Veronia Beets al, ASCO/CAP HER2 Testing Guideline Update ??? 2013 College of American Pathologists    Note: The PathVysion HER2 DNA Probe Kit (PathVysion Kit) which is FDA approved is designed to detect amplification of the HER-2/neu gene via fluorescence in situ hybridization (FISH) in formalin-fixed, paraffin-embedded human  breast cancer tissue specimens. The nuclei counted in these studies were within the region selected by a pathologist.  Specimens were fixed in 10% neutral buffered formalin for 6-72 hours. The FISH test was developed and its performance characteristics determined by [facility].       01/22/16 core biopsy [[place]]  BREAST, LEFT, 12:00???(NEEDLE CORE BIOPSY):  - Invasive ductal carcinoma, Grade 1???(50% of biopsy, longest involved segment spans 0.5 cm)  ?????????????????????Modified Bloom???and???Richardson Score: 5 of 9  ???????????????????????????????????????Tubule formation:????????????????????????????????????????????????2  ???????????????????????????????????????Nuclear pleomorphism:?????????????????????2  ???????????????????????????????????????Mitotic score:?????????????????????????????????????????????????????????????????????1 (1/10 HPF, 0.55 mm field diameter)  - Ductal carcinoma in situ (DCIS), micropapillary and cribriform, without central necrosis, low nuclear grade  - Lymph/vascular invasion: Not identified  - Microcalcifications: Not identified   - Breast biomarkers: See report below  - HER2/neu FISH:???Pending???and will be reported separately  RESULT ER/PR  ??? ESTROGEN RECEPTORS PROGESTERONE RECEPTORS   Antibody                Clone SP1               Clone 636   %Tumor Staining 95% 40%   Intensity (1+ to 3+) 3+ 1-2+   HER-2/neu IHC assay (utilizing FDA-approved DAKO Hercep Test): J. Clin. Oncol 2013; 1-18, and Arch Pathol Lab Med 1610:9-60.   ???  Test Score:   1+  ???  HER2/neu: No overexpression  Ki-67: 1-2%    Imaging   I independently reviewed imaging.     12/02/19 bilateral breast tomo and U/S, screening  MAMMOGRAM FINDINGS:     The breast tissue is extremely dense, which lowers the sensitivity of mammography (density D).     Finding 1:  There are post-surgical changes seen in both breasts.     No significant interval change is identified.     ULTRASOUND FINDINGS:     Finding 2:  Whole breast and axillary sonography was performed in both breasts. There is no evidence  of any suspicious solid mass or abnormal cystic elements.     No axillary lymphadenopathy.     IMPRESSION:     ???  Finding 1:  There is no mammographic evidence of malignancy.     Finding 2:  There is no sonographic evidence of malignancy.     Routine screening in 1 year is recommended.     The patient has a history of breast cancer. The Tyrer-Cuzick risk model does not apply.     BI-RADS Category 2:  Benign Finding(s)       12/01/2018 bilateral breast tomo + U/S screening     MAMMOGRAM FINDINGS:     The breast tissue is extremely dense, which lowers the sensitivity of mammography (density D).     There are post-surgical scars seen in both breasts.     No suspicious masses, calcifications or other abnormalities are seen in either breast.     ULTRASOUND FINDINGS:     Whole breast and axillary sonography was performed in both breasts. Ultrasound demonstrates post  surgical scars seen in both breasts.     There is no evidence of any suspicious solid mass or abnormal cystic elements.     IMPRESSION:  Post surgical scars in both breasts are benign.  Routine screening mammogram in 1 year is recommended.     BI-RADS Category 2:  Benign Finding(s)    12/11/16 bilateral breast tomo + U/S screening  MAMMOGRAM FINDINGS:     The breast tissue is extremely dense, which lowers the sensitivity of mammography (density D).  There are post-surgical changes seen in the left breast at 12 o'clock, upper outer quadrant.     No suspicious masses, calcifications or other abnormalities are seen in either breast.     No significant interval change is identified.     ULTRASOUND FINDINGS:     Whole breast and axillary sonography was performed in both breasts.     Ultrasound demonstrates a scar seen in the left breast at 12 o'clock, upper outer quadrant.     There is no evidence of any suspicious solid mass or abnormal cystic elements.     In the right breast, there is no evidence of any suspicious solid mass or other abnormality.     No axillary lymphadenopathy.     IMPRESSION:     ???  There is no mammographic or sonographic evidence of malignancy.     Routine screening mammogram in 1 year is recommended.     BI-RADS Category 2:        Benign Finding(s)    01/15/2016 Bilateral diagnostic breast ultrasound  RIGHT DIAGNOSTIC BREAST ULTRASOUND AND LEFT DIAGNOSTIC BREAST ULTRASOUND   ULTRASOUND FINDINGS:    Finding 1:  Targeted breast and axillary sonography was performed in the left breast in the area of the pain in the upper outer quadrant. There is an irregular not parallel vascular irregular mass with indistinct margins measuring 5 x 6 x 4 mm seen in the left breast at 12 o'clock located 6 centimeters from the nipple. Internal echotexture is hypoechoic. There are decreased posterior echoes; edge shadows are excluded. This mass is seen along the medial border of the left breast scar.    Finding 2:  Targeted breast and axillary sonography was performed in the right breast in the area of the pain. There is no sonographic correlate in the area of the pain in the lower outer quadrant of the right breast.  No axillary lymphadenopathy.    IMPRESSION:    Finding 1:  Irregular mass in the upper outer quadrant of the left breast is suspicious. An ultrasound guided biopsy is recommended. Although this structure may represent post-surgical change/scar, tissue diagnosis is suggested.  Findings and recommendations were discussed with the patient following the procedure, and she is amenable to biopsy. The recommendation for biopsy was also communicated to ordering provider Anselm Jungling via email.  Finding 2:  There is no evidence of malignancy.                                                                                 11/03/11 DEXA [[facility]]  PA Spine:       The average bone mineral density from L1-4 is 0.787 g/cm2, 2.4 standard deviations below the mean for normal young adults (*T-scores) or 75% of normal. Compared to age and sex-matched controls (**Z-scores), this density is 0.9 standard deviations   below the mean.  ???  Total Hip:       The bone mineral density in the left total hip is 0.706 g/cm2, 1.9 standard deviations below the mean for normal young adults (*T-scores) or 75% of normal.  Compared to age and sex-matched controls (**Z-scores), this density is 1.0 standard   deviations below  the mean.  ???  Femoral Neck:       The bone mineral density in the left femoral neck is 0.531 g/cm2, 2.9 standard deviations below the mean for normal young adults (*T-scores) or 63% of normal.  Compared to age and sex-matched controls (**Z-scores), this density is 1.6 standard   deviations below the mean.  ???  *T-score compares the BMD of an individual to peak bone mass and expresses the difference as a standard deviation score,**Z-score compares the BMD of an individual with age-matched, gender-matched, and race-matched controls and expresses the difference   as a standard deviation score.  ???  IMPRESSION:  1.  This is a baseline study at the [facility].  2.  The patient has osteoporosis.    Assessment & Recommendations   Lianne Carreto was seen in Multidisciplinary Breast Clinic with NP Samuel Jester, Surgical Oncologist Dr William Hamburger, Radiation Oncologist Dr Hilaria Ota, and Simms-[facility].    Madina Galati is a 69 y.o. post-menopausal female with ER+PR+HER2- (IHC) invasive ductal carcinoma of the left breast s/p lumpectomy with sentinel lymph node excision 02/29/16, s/p adjuvant XRT 03/27/16-04/25/16, now on tamoxifen.      ICD-10-CM    1. Invasive ductal carcinoma of left breast, ER+PR+HER2-  C50.912    2. Post-menopausal  Z78.0    3. Care related to current tamoxifen use  Z79.810      1. ER+PR+HER2- invasive ductal carcinoma in a menopausal woman. Stage IIA (pT2N0) based on pathological staging. I discussed an aromatase inhibitor x5 years vs tamoxifen x5-10 years. Side effects of aromatase inhibitors include loss of bone density, menopausal symptoms such as hot flashes, and joint stiffness/muscle aches. The side effects of tamoxifen include increased risk of blood clots, a low risk of endometrial cancer, and interference with medications cleared through the cytochrome P450 pathway in the liver (including the first generation SSRIs and coumadin).   - s/p Lumpectomy with sentinel lymph node biopsy with Dr Francella Solian 02/29/16.  - s/p radiation therapy 03/27/16-04/25/16.   - Robin Spencer and I discussed starting tamoxifen x5-10 years instead of an aromatase inhibitor for the following reasons: 1) she's on low-dose HRT (DHEA, which is converted to estrogen) for vaginal atrophy and 2) she has osteoporosis. We discussed the risk of uterine cancer (04/998) in particular because Robin Spencer's mother had uterine cancer. Based on family history, I don't have a suspicion for Lynch syndrome. She is tolerating tamoxifen well and continues to use vaginal estrogens, so will keep her on tamoxifen.     2. Osteoporosis.  DEXA 04/17/16 showed osteoporosis of the lumbar spine.  - Baseline DEXA scan prior to aromatase inhibitor, then q2 years. Currently she's on tamoxifen, so her bone density should increase.  - I recommend VitD 2000 U/day  - recommend calcium 1200mg /day in divided doses  - weight bearing exercise    3. Vaginal atrophy.   - As long as she's on tamoxifen, vaginal estrogen supplementation is safe. Often gyn uses higher dose estrogen initially to build up the lining of the vagina, then tapers off to a maintenance dose.  - provided list of moisturizers and lubricants to try.     4. Genetic counseling and testing. Does not meet criteria for genetic testing.     Return to Clinic: 6 months, sooner prn    I have personally spent 30 minutes counseling and coordinating care for the items checked below on the day of service.   [x]  Preparing to see the patient (  e.g., review of tests)  []  Obtaining and or reviewing separately obtained history   [x]  Performing a medically appropriate examination and/or evaluation   [x]  Counseling and educating the patient/family/caregiver   [x]  Ordering medications, tests, or procedures  []  Referring and communicating with other healthcare professionals (when not separately reported)  [x]  Documenting clinical information in the EHR  [x]  Independently interpreting results and communicating results to patient/ family/caregiver    Evonnie Dawes, MD, PhD  [facility]   [facility] Hematology/Oncology  [email or web address]  Pager [phone]    [facility]ology[facility] [place]  [address] [place], [place] [address]  Phone [phone], Fax [phone]  Appts: Wednesday 1-5PM, Friday 8-5PM    [facility] Hematology/Oncology   [address]  [place], [place], [address]  Phone [phone], Fax [phone]  Appts: Thursday 8-5PM

## 2019-12-06 NOTE — OR Nursing
Patient returned Bravo recorder with diary today via mail by [employer]. Data uploaded and Dr. Gluckman was emailed.

## 2019-12-07 DIAGNOSIS — Z780 Asymptomatic menopausal state: Secondary | ICD-10-CM

## 2019-12-07 DIAGNOSIS — Z79810 Long term (current) use of selective estrogen receptor modulators (SERMs): Secondary | ICD-10-CM

## 2019-12-07 MED ORDER — YUVAFEM 10 MCG VA TABS
ORAL_TABLET | 1 refills
Start: 2019-12-07 — End: ?

## 2019-12-08 MED ORDER — ESTRADIOL 10 MCG VA TABS
10 ug | ORAL_TABLET | VAGINAL | 1 refills | Status: AC
Start: 2019-12-08 — End: ?

## 2019-12-09 ENCOUNTER — Ambulatory Visit: Payer: MEDICARE

## 2019-12-09 NOTE — Procedures
Bravo 96-hour Intraesophageal pH Study  Endoscopic Placement    GI Motility Unit    Indications/Symptoms:  Gastroesophageal reflux disease  Medications: (Not in an outpatient encounter)    Referring physician: Orest Dikes., MD  [address]  [address]  [place],  [place] [address]     Nurse/Technician Carmon Ginsberg RN    Date of Procedure:  11/30/2019  Date recorder returned:  12/06/2019    Procedure:  The Bravo wireless pH sensor was placed at the time of endoscopy.  The patient was asked to keep a 96-hour diary indicating the times eating started and stopped, when symptoms occurred, when medications were taken and when upright or supine.  The patient was instructed to go about life as usual.    The study was done off acid suppressing medications for 48-hours and on acid suppressing medications for the next 48-hours.        Results:  Day 1: Off medications   Total Upright Supine   Duration (hh.mm) 01:02 00:06 00:56   # of refluxes 9 5 4    % time pH <4.0 4.9 (nl < 5.3) 0.8 (nl < 6.9) 10.1 (nl < 6.7)   DeMeester Score 22.6 (Normal <14.72)    Day 2: Off medications   Total Upright Supine   Duration (hh.mm) 00:04 00:04 00:00   # of refluxes 4 4 0   % time pH <4.0 0.3 (nl < 5.3) 0.5 (nl < 6.9) 0.0 (nl < 6.7)   DeMeester Score 1.2 (Normal <14.72)    Day 3: On Medications   Total Upright Supine   Duration (hh.mm) 00:03 00:03 00:00   # of refluxes 7 7 0   % time pH <4.0 02 (nl < 5.3) 0.3 (nl < 6.9) 0.0 (nl < 6.7)   DeMeester Score 1.3 (Normal <14.72)      Day4: On medications   Total Upright Supine   Duration (hh.mm) 00:00 00:00 00:00   # of refluxes 1 1 0   % time pH <4.0 0.0 (nl < 5.3) 0.0 (nl < 6.9) 0.0 (nl < 6.7)   DeMeester Score 0.4  (Normal <14.72)  (Pandolfino JE, Am J Gastroenterol. 2003; 98: [id number])         Diagnosis:   - this 96 hour pH Bravo study is not suggestive of gastroesophageal reflux disease  - the acid exposure was physiological throughout except on day 1, with mild supine esophageal acid exposure  - poor concordance and symptom association with belching and measured acid    Discussion/Recommendations:   Negative study for gastroesophageal reflux disease.  Consider workup of supragastric belching if clinically indicated.  Follow-up with referring provider    Jorene Guest MD  12/09/2019  10:14 AM

## 2019-12-13 ENCOUNTER — Telehealth: Payer: BLUE CROSS/BLUE SHIELD

## 2019-12-13 DIAGNOSIS — R0789 Other chest pain: Secondary | ICD-10-CM

## 2019-12-13 NOTE — Progress Notes
Gastroenterology Consult   ATTENDING: Lorin Picket A. Jeri Rawlins   PATIENT: Robin Spencer  MRN: [id number]  DOB: March 13, 1951  DATE OF SERVICE: 12/13/2019  REFERRING PROVIDER: No ref. provider found  PRIMARY CARE PROVIDER: Pregler, Weyman Croon., MD    REASON FOR REFERRAL: atypical chest pain    Subjective:     Chief Complaint:  [name] is a 69 y.o. female with OSA, PACs, HSV-2, osteoporosis, fibromyalgia, hx of breast CA on tamoxifen who initially presented August 2021 for evaluation of chest pains. Initially patient thought her pain was due to her heart and saw a cardiologist. She underwent heart monitor, TTE and pulse oximetry with only PACs being found. She was then told it might be related to acid reflux. She has been taking omeprazole for the past month which she feels has helped signifcantly.  Patient states she feels like she gets epigastric and chest pain that feels like a hunger pain or a fluttering in her chest. She also endorses that she changed her diet (oatmeal, potatoes, sweet potatoes) during this time which she also thinks may have helped.     Review of her most recent colonoscopy shows x2 hyperplastic polyps were removed in 2017.     S: Patient is s/p EGD w/ Bravo/ Her EGD was essentially normal. Her Bravo showed pathological reflux on day 1 but not day 2 and days 3 and 4 when she was on meds was normal as well. She clarifies that she is not experiencing chest pain but like a fluttering sensation in her esophagus. She feels like sometimes it feels like things get stuck. She notes if she drinks anything, even water she has to belch. She states since her procedure she is feeling better. She is no longer on omeprazole and does not feel any different since coming off of it. She feels like while she was in [place] she was back to herself and that she is more symptomatic when she is at home where she is at an elevation level of 6065ft above sea level, so she thinks the elevation may be related to her symptoms. Past Medical & Surgical History:  Patient Active Problem List   Diagnosis   ??? Osteoporosis   ??? Eye allergies   ??? Genital HSV   ??? PPD positive   ??? Palpitations   ??? OA (osteoarthritis)   ??? Visit for preventive health examination   ??? Hearing loss, right   ??? Seborrheic dermatitis   ??? Menopause   ??? BPPV (benign paroxysmal positional vertigo)   ??? Fibroid   ??? Cataract, nuclear sclerotic, both eyes   ??? Status post LASIK surgery of both eyes   ??? Breast pain   ??? Invasive ductal carcinoma of left breast, ER+PR+HER2-   ??? Immune to measles   ??? Syncope and collapse   ??? Increased PTH level   ??? Vitreous floater, bilateral   ??? Asymptomatic varicose veins   ??? Rash, Intermittent, noted after colds   ??? Fibromyalgia   ??? Mucous cyst of digit of left hand   ??? Other fracture of right great toe, initial encounter for closed fracture   ??? OSA (obstructive sleep apnea)   ??? Dyspepsia   ??? Schatzki's ring      Past Medical History:   Diagnosis Date   ??? Anxiety    ??? Breast cancer (HCC/RAF)    ??? Eczema    ??? Fibroids    ??? GERD (gastroesophageal reflux disease)     esophagitis   ???  History of migraine headaches    ??? History of radiation therapy 04/2016    L breast   ??? OSA (obstructive sleep apnea) 11/07/2019   ??? Otosclerosis    ??? Post-operative nausea and vomiting    ??? Rectal fissure    ??? Vitreous detachment      Past Surgical History:   Procedure Laterality Date   ??? BREAST BIOPSY  1986    Fibroadenoma, ductal hyperplasia   ??? Laparoscopy for subfertility     ??? Left finger cyst removal x 2     ??? Stapedectomy, right         Relevant Family History:  (yes if checked)  []   Family history of colorectal cancer    []   Family history of GI cancer (not CRC)   []   Family history of IBD   []   Family history of celiac disease    Relevant Social History:  EtOH ; Tobacco ; Drugs      MEDICATIONS:     No outpatient medications have been marked as taking for the 12/13/19 encounter (Telemedicine) with Alvina Filbert A., MD.         Allergies :    is allergic to penicillins; menadiol sodium diphosphate; and other.    PHYSICAL EXAM    There were no vitals filed for this visit.   There is no height or weight on file to calculate BMI.    System Check if examined and normal Positive or additional negative findings   Constit  [x]  General appearance - normal     Eyes  [x]  Conjuctivae clear       HENMT  [x]  Normal Lips/teeth/gums, oropharynx, head     Neck  [x]  Inspection/palpation; thyroid normal      Resp  [x]  Effort good. Clear to auscultation bilaterally.     CV  []  Normal Rhythm/rate without murmur     Abdomen  []  Abdomen soft and NT, no rebound/guarding   []  Active BS 4 quadrants   []  No appreciable flank or shifting dullness   []  No hepatosplenomegaly   []  No umbilical hernia    Rectal   []  No external hemorrhoids   []  No masses palpated on DRE   []  Thelen stool in vault   []   Appropriate resting and squeeze pressure   []   Appropriate bear down maneuver      MSK  [x]  Grossly normal strength, ROM     Neuro   [x]  Grossly normal      Psychiatric  [x]   Oriented to time, place and person   [x]   Appropriate insight/judgement & mood/affect                Lab Review:  Lab Results   Component Value Date    WBC 5.37 05/12/2019    HGB 13.5 05/12/2019    HCT 40.2 05/12/2019    MCV 93.7 05/12/2019    PLT 228 05/12/2019     Lab Results   Component Value Date    CREAT 0.64 05/12/2019    BUN 24 (H) 05/12/2019    NA 139 05/12/2019    K 4.6 05/12/2019    CL 101 05/12/2019    CO2 27 05/12/2019    ALT 26 05/12/2019    AST 23 05/12/2019    ALKPHOS 56 05/12/2019    BILITOT 0.3 05/12/2019    ALBUMIN 4.7 05/12/2019     Lab Results   Component Value Date    SRWEST 6 09/23/2016  CRP <0.3 09/23/2016     Lab Results   Component Value Date    INR 1.0 12/28/2015    VITAMINB12 1,123 (H) 09/23/2016    FOLATE 20.6 06/26/2016     No results found for: HELPYLAB, HELPYLAGSTL      Imaging:       GI Studies:  EGD 11/30/19- 1. Congenital inlet patches of the esophagus.  2. Hiatal hernia and Schatzki ring.  3. Bravo pH probe placed.  Bravo- Diagnosis:   - this 96 hour pH Bravo study is not suggestive of gastroesophageal reflux disease  - the acid exposure was physiological throughout except on day 1, with mild supine esophageal acid exposure  - poor concordance and symptom association with belching and measured acid  ???  Discussion/Recommendations:   Negative study for gastroesophageal reflux disease.  Consider workup of supragastric belching if clinically indicated.    ESOPHAGUS, PROXIMAL, INLET PATCH (BIOPSY):  - Gastric oxyntic heteropia, consistent with inlet patch   - No intestinal metaplasia     Colonoscopy 10/08/15- The quality of the preparation was good.  ??? A 4-5 mm flat polyp was biopsy removed from 25 cm.  ??? A 3-4 mm flat polyp and a 2 mm flat polyp was biopsy removed from 15 cm.  ??? No other polyps, masses, diverticula or vascular ectasia was seen on careful withdrawal of the instrument.  ??? Turnaround examination in the rectum revealed internal hemorrhoids.   A. LARGE INTESTINE, POLYP AT 30 CM (BIOPSY):   - Fragments of hyperplastic polyp   - Deeper sections examined  ???  B. LARGE INTESTINE, TWO POLYPS AT 15 CM (BIOPSY):   - Fragments of hyperplastic polyp     Colonoscopy 05/28/04- normal    Medical Decision Making addressed on 12/13/2019:      Number and Complexity of Problems Addressed at the Encounter:   []   1 or more chronic illness with exacerbation, progression, or side effects of treatment  []   2 or more stable chronic illness  [x]   1 undiagnosed new problem with uncertain diagnosis  []   1 acute illness with systemic symptoms  []   1 acute complicated injury     Review of Data: I have  [x]  Reviewed/ordered ? 3 unique laboratory, radiology, and/or diagnostic tests noted    []  Reviewed prior external notes and incorporated into patient assessment as noted  [x]  I have independently interpreted test performed by other physician(s) as noted   []  Discussed management or test interpretation with external provider(s) as noted       Risk of Complication and or Morbidity or Mortality of Patient Management including Social Determinants of Health:   [x]   I deem the above diagnoses to have a risk of complication, morbidity or mortality of Moderate   []  The diagnosis or treatment of said conditions is significantly limited by social determinants of health as noted.     Visit Diagnoses  / Problems addressed on 12/13/2019:     Encounter Diagnosis   Name Primary?   ??? Atypical chest pain Yes          Assessment & Plan :    Lucy Boardman is a 69 y.o. year old female with a past medical history significant for OSA, PAC, HSV-2, osteoporosis, fibromyalgia, hx of breast CA on tamoxifen who presents for follow up after EGD w/ Bravo to r/o GERD as cause of her atypical chest pain. Her chest pain does not appear to be related to acid reflux but could  be related to esophageal dysmolitity.    1. Atypical chest pain/esophageal dysmotility- improved  -Bravo study 11/30/19 not consistent with GERD as cause of patient's chest pain.   -advised patient if her symptoms worsen the next step would be to pursue esophageal manometry to r/o motility disorders. Patient currently is feeling better and prefers to hold off on this at this time and will call back if her symptoms worsen.    2.Colon cancer screening  -repeat in 2027     The above recommendation were discussed with the patient. The patient has all questions answered satisfactorily and is in agreement with this recommended plan of care.    Princess Karnes A. Patrina Andreas, MD   12/13/2019 10:22 AM      Level 4 billing based on time:  30 min were spent on this patient visit included physician face-to-face time, preparing to see the patient, counseling and educating patient/family/caregiver, ordering medications/tests/procedures, referring and communicating with other healthcare professionals, documenting clinical information in the EHR and independently interpreting results and communicating results to patient/family/caregiver.      New patient: [id number] 45-59 min  Established patient: [id number] 30-39 min

## 2020-01-05 ENCOUNTER — Ambulatory Visit: Payer: PRIVATE HEALTH INSURANCE

## 2020-01-08 ENCOUNTER — Ambulatory Visit: Payer: BLUE CROSS/BLUE SHIELD

## 2020-01-10 ENCOUNTER — Ambulatory Visit: Payer: BC Managed Care – POS

## 2020-01-10 ENCOUNTER — Ambulatory Visit: Payer: BLUE CROSS/BLUE SHIELD

## 2020-01-10 DIAGNOSIS — R1013 Epigastric pain: Secondary | ICD-10-CM

## 2020-01-24 ENCOUNTER — Ambulatory Visit: Payer: MEDICARE

## 2020-02-02 ENCOUNTER — Ambulatory Visit: Payer: BC Managed Care – POS

## 2020-02-02 ENCOUNTER — Ambulatory Visit: Payer: MEDICARE

## 2020-02-02 ENCOUNTER — Ambulatory Visit: Payer: BLUE CROSS/BLUE SHIELD

## 2020-02-16 ENCOUNTER — Ambulatory Visit: Payer: BC Managed Care – POS

## 2020-04-06 ENCOUNTER — Telehealth: Payer: BLUE CROSS/BLUE SHIELD

## 2020-04-06 NOTE — Telephone Encounter
Appointment Accommodation Request      Appointment Type: return    Reason for sooner request: Pt stated that she will be in town the week of Feb 28- March 4. Pt is trying to come in to see doctor. [facility] [phone]    Date/Time Requested (If any): Feb 28- March 4. Anytime works, but pt has an appointment on March 3 at 10:30 already scheduled.     Last seen by MD: 10/07/2018    Any Symptoms:  []  Yes  [x]  No       If yes, what symptoms are you experiencing:   o Duration of symptoms (how long):     Patient or caller was offered an appointment but declined.    Patient or caller was advised to seek emergency services if conditions are urgent or emergent.    Patient has been notified of the 24-48 hour turnaround time.

## 2020-04-06 NOTE — Telephone Encounter
Please try to accommodate in that interval. Thank you!

## 2020-04-07 MED ORDER — ACYCLOVIR 400 MG PO TABS
400 mg | ORAL_TABLET | Freq: Two times a day (BID) | ORAL | 1 refills
Start: 2020-04-07 — End: ?

## 2020-04-09 MED ORDER — ACYCLOVIR 400 MG PO TABS
400 mg | ORAL_TABLET | Freq: Two times a day (BID) | ORAL | 1 refills | Status: AC
Start: 2020-04-09 — End: ?

## 2020-04-15 MED ORDER — TAMOXIFEN CITRATE 20 MG PO TABS
ORAL_TABLET | ORAL | 2 refills | 60.00000 days
Start: 2020-04-15 — End: ?

## 2020-04-16 MED ORDER — TAMOXIFEN CITRATE 20 MG PO TABS
ORAL_TABLET | 1 refills | Status: AC
Start: 2020-04-16 — End: ?

## 2020-04-22 ENCOUNTER — Ambulatory Visit: Payer: BC Managed Care – POS

## 2020-04-23 DIAGNOSIS — E349 Endocrine disorder, unspecified: Secondary | ICD-10-CM

## 2020-04-23 DIAGNOSIS — M810 Age-related osteoporosis without current pathological fracture: Secondary | ICD-10-CM

## 2020-05-01 NOTE — Telephone Encounter
Scheduled and confirmed for 3/3

## 2020-05-10 ENCOUNTER — Ambulatory Visit: Payer: BC Managed Care – POS

## 2020-05-11 DIAGNOSIS — H9191 Unspecified hearing loss, right ear: Secondary | ICD-10-CM

## 2020-05-11 DIAGNOSIS — M810 Age-related osteoporosis without current pathological fracture: Secondary | ICD-10-CM

## 2020-05-11 DIAGNOSIS — Z23 Encounter for immunization: Secondary | ICD-10-CM

## 2020-05-12 NOTE — Patient Instructions
Please send a copy of your Durable Power of Attorney/[employer] Planning Form to our office so we can put it in our computer system.  This will make it available to all your physicians, the Emergency Department, and our hospitals, if needed.     Have an eye exam with Dr. Kyra Leyland.     Consider ''Prevnar 13''  Pneumonia shot.

## 2020-05-15 NOTE — Progress Notes
Pt is a 70 y.o. female who presents today for:  No chief complaint on file.      Subjective:      Last Visit 10/07/2018:    TELEMEDICINE APPOINTMENT:    Due to the nature of this encounter, standard vitals and physical examination may not have been conducted unless otherwise noted in their respective portions of this progress note.     Patient Consent to Telehealth Questionnaire   [facility] TELEHEALTH PRECHECKIN QUESTIONS 12/13/2019   By clicking ''I Agree'', I consent to the below:  I Agree     - I agree  to be treated via a video visit and acknowledge that I may be liable for any relevant copays or coinsurance depending on my insurance plan.  - I understand that this video visit is offered for my convenience and I am able to cancel and reschedule for an in-person appointment if I desire.  - I also acknowledge that sensitive medical information may be discussed during this video visit appointment and that it is my responsibility to locate myself in a location that ensures privacy to my own level of comfort.  - I also acknowledge that I should not be participating in a video visit in a way that could cause danger to myself or to those around me (such as driving or walking).  If my provider is concerned about my safety, I understand that they have the right to terminate the visit.     The patient's visit was conducted through a HIPAA compliant telemedicine platform using synchronous audio, video, and access to the patient's medical chart and any prior imaging. The encounter needed to be a telemedicine encounter given the current social distancing recommendations during the COVID-19 pandemic. The option of a telemedicine encounter was offered and the patient gave their consent and elected to initiate the telemedicine visit.     Patient says she has been doing ''really well.''    She has lost 10lbs since last visit by cutting out processed foods, increasing her protein intake, and focusing on fruits and vegetables.    She is s/p finger surgery that she has with Dr. Augusto Garbe in 07/2018 and his is happy with the results.     Patient has great benefits from low dose flexeril 2.5-5 mg QHS for myofascial neck pain.     Interval History 05/17/2020:    Patient is doing ''even better'' than last visit.    In summer 2021, patient was experiencing palpitations. During work up patient was found to have low oxygen at night and OSA. Uses CPAP machine and palpitations resolved. She feels refreshed when she wakes up.     Patient had COVID-19 in December 2021. Felt like minor cold.      Patient had 8-hour cosmetic surgery on 05/01/2020. Has noticed soreness around the elbows.      Patient's brother and father have similar osteoarthritis in the hands. Endorses some discomfort related to osteoarthritis of hands. Does not take Tylenol or Motrin.     ---------------------------------------------------------------------  ---------------------------------------------------------------------  ---------------------------------------------------------------------    The following records were reviewed and/or discussed with patient below:     Component      Latest Ref Rng & Units 05/17/2020 05/17/2020           8:14 AM  8:14 AM   Sodium      135 - 146 mmol/L  144   Potassium      3.6 - 5.3 mmol/L  4.0   Chloride  96 - 106 mmol/L  105   Total CO2      20 - 30 mmol/L  27   Anion Gap      8 - 19 mmol/L  12   Glucose      65 - 99 mg/dL  92   Creatinine      2.54 - 1.30 mg/dL  2.70   GFR Est.for African American      See GFR Additional Information mL/min/1.27m2  >89   GFR Est.for Non-African Americ      See GFR Additional Information mL/min/1.65m2  88   GFR Additional Information        See Comment   Urea Nitrogen      7 - 22 mg/dL  15   Calcium      8.6 - 10.4 mg/dL 9.3 9.3   TOTAL PROTEIN      6.1 - 8.2 g/dL  6.7   Albumin      3.9 - 5.0 g/dL  4.5   Bilirubin,Total      0.1 - 1.2 mg/dL  0.5   Alkaline Phosphatase      37 - 113 U/L  52   AST (SGOT)      13 - 62 U/L  21 ALT (SGPT)      8 - 70 U/L  16   Cholesterol, HDL      >50 mg/dL 78    Cholesterol      See Comment mg/dL 623    Chol,LDL,Quant      <100 mg/dL 91    TSH      0.3 - 4.7 mcIU/mL 1.6    Vitamin D,25-Hydroxy      20 - 50 ng/mL 69 (H)    PTH, Intact      11 - 51 pg/mL 64 (H)      ---------------------------------------------------------------------  ---------------------------------------------------------------------  ---------------------------------------------------------------------    ROS No Yes   No Yes    No Yes  Fever [x]   []  Vision loss [x]   []  Unexpected bleeding [x]   []       Medical Decision Making:      - Fibromyalgia.   05/17/2020: Stable on present regimen. Improvement.  [x]  stable/at goal   []  not stable/at goal   []  exacerbated   []  severe exacerbation     - History of L finger mucoid cyst, referred to orthopedics.  05/17/2020: S/p removal with Dr. Augusto Garbe.  [x]  stable/at goal   []  not stable/at goal   []  exacerbated   []  severe exacerbation      - Osteoarthritis hands, doing home exercises with no significant pain.  05/17/2020: Active.   []  stable/at goal   [x]  not stable/at goal   []  exacerbated   []  severe exacerbation     - Neck pain, likely combination of myofascial pain and spondylosis, 09/2016 xray cervical spine shows DDD cervical spine.    05/17/2020: Active.   []  stable/at goal   [x]  not stable/at goal   []  exacerbated   []  severe exacerbation     - Osteoporosis, under the care of Dr. Lorenza Chick.  []  stable/at goal   []  not stable/at goal   []  exacerbated   []  severe exacerbation     - OSA. Uses CPAP machine. Insomnia, improvement with OTC magnesium supplementation.   [x]  stable/at goal   []  not stable/at goal   []  exacerbated   []  severe exacerbation     - History of palpitations. Resolved with CPAP machine.  [  x] stable/at goal   []  not stable/at goal   []  exacerbated   []  severe exacerbation   - H/o migraine headaches before menopause.   []  stable/at goal   []  not stable/at goal   []  exacerbated   []  severe exacerbation     - History of COVID-19 in December 2021.   [x]  stable/at goal   []  not stable/at goal   []  exacerbated   []  severe exacerbation     Plan:   ???  Follow Up: as needed.    - Referral to occupational therapy for osteoarthritis of the hands.     - Consider traumeel cream OTC or prescription.     - Consider paraffin bath for hands osteoarthritis.     - Continue OTC magnesium citrate 400 mg daily.  ???  - Continue turmeric, ginger, and fish oil supplementation.     - Continue beneficial lifestyle modifications.    - Follow with Dr. Augusto Garbe recommendations.     - Follow with Dr. Josiah Lobo recommendations.    - Follow with Dr. Lorenza Chick for vitamin D3 and calcium supplementation recommendations.  ??????  - 2018 EULAR recommendations for hands osteoarthritis:   Chondroitin sulfate may be used in patients with hand OA for pain relief and improvement in functioning.     Reference:   Ayesha Rumpf, et al. 2018 update of the EULAR recommendations for the management of hand osteoarthritis [published online November 11, 2016]. Ann Rheum Dis. doi:10.1136/annrheumdis-2018-213826     - Advised patient of the following:    -- You should always schedule an appointment to discuss your results.  Alternatively, always contact me for  your results and I can e-mail them via mychart or mail them.  You should always hear from me about your test results.   -- If not already done, please set up your online charting account to receive emails and check your labs. I recommend setting up mychart for online access to your test results.  Mychart messages should be reserved for non-urgent and BRIEF questions.  For urgent messages, please call and/or schedule an appointment.  These messages are only received during business hours.   -- Also if you would like to get a list of things ordered prior to getting them done, please ask the front desk for the after visit summary (AVS) before getting labs or imaging done.  -- Please check with your insurance about your financial responsibility for services rendered (consultation, laboratories and radiological studies).  -- If you get labs done at an outside facility please call within 24 hours to notify us to ensure we receive those results.        No orders of the defined types were placed in this encounter.    I discussed with patient at length need of regular exercise, weight management and methods of coping with psychosocial stressors.   I reviewed the plan of care in detail with patient. Patient had the opportunity to ask questions and expressed satisfaction with the plan of care.  The benefits and risks of medications were also explained in detail.    []  If box is checked, patient advised to monitor CBC (white blood cell count, hemoglobin, platelet count), liver enzyme levels (AST, ALT), and kidney function (creatinine) every 3 months for possible __ side effects.    []  If box is checked, patient is taking plaquenil (hydroxychloroquine), and advised to schedule retinal exam/eye exam by ophthomology once a year.  Billing By Complexity:       As noted above, I have:    [x]  Reviewed, analyzed, and/or ordered []  1 []  2 [x]  ? 3 unique laboratory, radiology, and/or diagnostic tests    []  Reviewed  []  1 []  2 []  ? 3 prior external notes and incorporated into patient assessment    [x]  Independently interpreted studies    []  Discussed management or test interpretation with external provider(s)    [x]  Risk status: []  Minimal []  Low [x]  Moderate []  High    Billing By Time:        [x]  Total of  _30_  minutes spent on the visit time including: preparation, obtaining/reviewing previously obtained history, physical examination, counseling the patient and/or their representative(s), documentation, any independent interpretation of results, and care coordination as documented in this encounter.     []  Total of  _30_  minutes spent on the telemedicine/telephone visit including: preparation, obtaining/reviewing previously obtained history, physical examination, counseling the patient and/or their representative(s), documentation, any independent interpretation of results, and care coordination as documented in this encounter.     Procedures:         Past Medical History:   No specialty comments available.    Patient Active Problem List    Diagnosis Date Noted   ??? Schatzki's ring 12/01/2019     On EGD 2021 By Dr. Cleda Clarks, Bravo placed.  No Barrett's esophagus.      ??? Dyspepsia      2021 with sx, saw Dr. Donnamarie Poag, plan: PPI, will plan for EGD w/ Bravo to confirm or disprove reflux given atypical presentation of symptoms. EGD 2021 Dr. Cleda Clarks:  ESOPHAGUS, PROXIMAL, INLET PATCH (BIOPSY):  - Gastric oxyntic heteropia, consistent with inlet patch- No intestinal metaplasia, BRAVO placed. BRAVO:  Negative study for gastroesophageal reflux disease.  Consider workup of supragastric belching if clinically indicated..  Plan follow up Dr. Donnamarie Poag.     ??? OSA (obstructive sleep apnea) 11/07/2019   ??? Other fracture of right great toe, initial encounter for closed fracture 05/06/2019     2021:  Patient presents with right big toe pain, swelling, and tenderness after trauma last night. X-ray completed during this visit confirms fracture of big toe.  - Reviewed x-ray findings and images with patient  - STAT referral to patient's podiatrist for ongoing management  - Provided post-op shoe with firm sole and crutches to patient, discussed importance of avoiding any bending of the toe  - Reviewed symptomatic care with Tylenol/Ibuprofen PRN, elevation, ice  ???  ???      Orders Placed This Encounter   ??? Referral to Podiatry   ???       ??? Mucous cyst of digit of left hand 06/02/2018   ??? Fibromyalgia 05/10/2018     Better sleep with flexeril       ??? Rash, Intermittent, noted after colds 04/29/2018   ??? Asymptomatic varicose veins 03/01/2018   ??? Vitreous floater, bilateral 06/11/2017     No retinal detachment, retinal tear, or vitreous hemorrhage on dilated fundus exam. Signs and symptoms reviewed.     ??? Increased PTH level 05/08/2017     Sees Dr. Selena Batten in endocrinology.      ??? Syncope and collapse 06/25/2016     2018, ECG/Labs/MRI w/o gad unrevealing, saw neurology Dr. Lorina Rabon, plan; EEG, followup      ??? Immune to measles 05/09/2016     (+) Ab 2018     ??? Invasive ductal carcinoma of left breast,  ER+PR+HER2- 01/29/2016     2017, Bilateral tomosynthesis screening mammogram on 12/11/2015. There were no suspicious findings seen. She had postsurgical changes in both breasts. Patient then developed bilateral breast pain, left greater than right. She was sent for bilateral diagnostic ultrasounds on 01/15/2016. The right breast was negative. However, in the left breast, there was an irregular mass seen with indistinct margins, measuring 5 x 6 x 4 mm, at 12 o'clock, 6 cm from the nipple. Her left axilla was negative. Genetic testing: NO MUTATION DETECTED/This panel tests three Founder Mutations in the BRCA1/2 genes (BRCA1 187delAG, BRCA1 5385insC, BRCA2 6174delT; lumpectomy: sentinal node negative x 2.  TAM recommended by oncology, annual endometrial surveillance given history by GYN.      ??? Breast pain 01/09/2016     [facility] plan per NP, imaging, lifestyle changes     ??? Cataract, nuclear sclerotic, both eyes 11/29/2015     Not yet visually significant. Continue observation.      ??? Status post LASIK surgery of both eyes 11/29/2015     Performed by Dr. Ellin Goodie (2000 ?). Originally set with left eye for distance and low myopia for right.     ??? Fibroid 04/09/2015     2017 on ultrasound.      ??? BPPV (benign paroxysmal positional vertigo) 07/19/2013   ??? Menopause 04/14/2013   ??? Seborrheic dermatitis 05/11/2012   ??? 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 PPD positive 04/07/2012   ??? Palpitations 04/07/2012     2009 ER PACs, PVCs, sinus arrythmia, ECHO, Stress ECHO WNL 2009     ??? OA (osteoarthritis) 04/07/2012     Hands 2011, anti CCP neg, no evidence inflammation     ??? Visit for preventive health examination 04/07/2012     Colo noscopy= hyperplastic polyps 2017     ??? Hearing loss, right 04/07/2012     Otosclerosis, s/p stapedectomy     ??? Eye allergies 01/08/2012     Not having significant symptoms at this time, but could use over the counter anti-histamine eye drops, like ketotifen or artificial tears as needed if symptoms recur.     ??? Osteoporosis 11/10/2011     DEXA 2013, spine-2.4, femoral neck -2.9. On HT for menopause sx and as osteoporosis rx.; PA lumbar spine T-score of -2.7, will begin TAM per oncology.          Past Medical History relevant to rheumatology:         Current Medications Relevant to Rheumatology:     Rheum Meds       Disp Refills Start End    omeprazole 40 mg DR capsule   11/12/2019     Class: Historical Med    TAMOXIFEN 20 mg tablet 90 tablet 1 04/16/2020     Sig: TAKE 1 TABLET BY MOUTH EVERY DAY        Rheum Meds Other     Oil Soluble Vitamins       Cholecalciferol (VITAMIN D3) 5000 units CAPS    Take 1 capsule by mouth daily.    Calcium       CALCIUM CITRATE PO    Take 630 mg by mouth daily.           Allergies   Allergen Reactions   ??? Penicillins Other (See Comments) and Rash     Ampicillin, flesh rash in 1980s   ??? Menadiol Sodium Diphosphate Rash   ??? Other      [place] coloring  on meds such as advil- not allergic to actual med- just the coloring       Physical Exam:   LMP  (LMP Unknown)        LMP  (LMP Unknown)   Checked Box= body part examined.  Abnormalities noted are listed next to the body part.   NL AbNL  Comment  NL AbNL Comment   General [x]  []   ENT      Eyes       Oropharynx [x]   []         Conjunct/Sclera [x]   []       Nasal mucosa []   []         Lids/Periorbital [x]   []       Ears []   []         EOM [x]   []    Neck         Pupils [x]   []       Inspect/palp [x]   []      Psychiatric       Thyroid []   []         Oriented (p,p,t) [x]   []       Neck ROM/spple []   []         Affect [x]   []    Lymphatic         Judgemnt/insite [x]  []       Neck [x]   []      Neurological       Supraclavicular []   []         CN I-XII [x]   []    Respiratory         Sensation [x]   []       Auscultation [x]   []      Skin         Chest expansn [x]   []         Inspect [x]   []    Cardiovascular         Palpate [x]   []       Auscultation [x]   []         Periungal cap bed []   []       Depnt edema []   []      Musculoskeletal    GI          Motor [x]   []       Abdomen []   []        Gait [x]   []       Liver/Spleen []   []        Nailbeds []   []    Other findings:      [x]  28 Joints, or  []  64 joints were examined.    Either exam includes all 4 extremites. Except where noted, the joints examined have FROM, no tenderness or swelling; normal stability, and normal strength.  Abnormalities are coded on homonculus or detailed below:  - [place] and Bouchard nodes.  - Hypersensitivity to light touch.       RIGHT         LEFT   TMJ [] []   ~ [] []  TMJ   Shoulder [] []  ~ [] []  Shoulder                Left box: Tender   Elbow [] []  ~ [] []  Elbow               Right box: Swollen   Wrist [] []  ~ [] []  Wrist   CMC1 [] []  ~ [] []  CMC1       MCP5 MCP4 MCP3 MCP2 MCP1         ~ MCP1 MCP2 MCP3 MCP4 MCP5       [] []  [] []  [] []  [] []  [] []   [] []  [] []  [] []  [] []  [] []   PIP5 PIP4 PIP3 PIP2 IP1 ~ IP1 PIP2 PIP3 PIP4 PIP5       [] []  [] []  [] []  [] []  [] []   [] []  [] []  [] []  [] []  [] []        DIP5 DIP4 DIP3 DIP2  ~  DIP2 DIP3 DIP4 DIP5       [] []  [] []  [] []  [] []     [] []  [] []  [] []  [] []       Sacroiliac []  ~             []  Sacroiliac                Hip  [] []  ~        [] []  Hip                  Knee  [] []  ~        [] []  Knee        Ankle    [] []  ~        [] []  Ankle           MTP5 MTP4 MTP3 MTP2 MTP1 ~ MTP1 MTP2 MTP3 MTP4 MTP5       [] []  [] []  [] []  [] []  [] []   [] []  [] []  [] []  [] []  [] []        Toe5 Toe4 Toe3 Toe2 Toe1 ~ Toe1 Toe2 Toe3 Toe4 Toe5        [] []  [] []  [] []  [] []  [] []   [] []  [] []  [] []  [] []  [] []     Fibromyalgia Tender Points:         Recent Labs and Test Results:     Lab Results   Component Value Date    CRP <0.3 09/23/2016    CRP <0.5 05/19/2007    SRWEST 6 09/23/2016    SRWEST 11 05/19/2007     Lab Results   Component Value Date    WBC 5.37 05/12/2019    HGB 13.5 05/12/2019    HCT 40.2 05/12/2019    PLT 228 05/12/2019    NEUTABS 3.56 05/12/2019    LYMPHABS 1.24 (L) 05/12/2019     Lab Results   Component Value Date    GLUCOSE 106 (H) 05/12/2019    CREAT 0.64 05/12/2019    CALCIUM 9.6 11/28/2019    TOTPRO 7.1 05/12/2019    ALBUMIN 4.7 05/12/2019    AST 23 05/12/2019    ALT 26 05/12/2019    ALKPHOS 56 05/12/2019     No results found for: C3, C4, DSDNAAB, NDNAABIFA  Lab Results   Component Value Date    PROTCLUR Negative 02/14/2016    BLDUR 1+ (A) 02/14/2016    LEUKESTUR Negative 02/14/2016    RBCSUR 11 02/14/2016    WBCSUR 2 02/14/2016     Lab Results   Component Value Date    VITD25OH 47 11/28/2019    VITD25OH 48 05/12/2019     Lab Results   Component Value Date    CKTOT 64 06/02/2016       No visits with results within 1 Month(s) from this visit.   Latest known visit with results is:   Admission on 11/30/2019, Discharged on 11/30/2019   Component Date Value   ??? CASE REPORT 11/30/2019                      Value:Surgical Pathology Report                         Case: [id number]  Authorizing Provider:  Crista Elliot., MD     Collected:           11/30/2019 0939              Ordering Location:     [facility]                 Received:            11/30/2019 1128              Pathologist:           Gus Rankin., MD                                                           Specimen:    Esophagus, proximal esophagus inlet patch bx                                              ??? CLINICAL INFORMATION 11/30/2019                      Value:This result contains rich text formatting which cannot be displayed here.   ??? FINAL DIAGNOSIS 11/30/2019                      Value:This result contains rich text formatting which cannot be displayed here.   ??? MICROSCOPIC DESCRIPTION 11/30/2019                      Value:This result contains rich text formatting which cannot be displayed here.   ??? GROSS DESCRIPTION 11/30/2019                      Value:This result contains rich text formatting which cannot be displayed here.   ??? Signatures 11/30/2019                      Value:This result contains rich text formatting which cannot be displayed here.       Attestation:  Orthoptist:  I, Luberta Robertson, have scribed for Dr. Linus Mako with the documentation for Robin Spencer on 05/17/2020 at 6:28 PM.    Physician Signature:  Dr. Linus Mako 05/14/2020 6:28 PM     I have reviewed this note, scribed by Luberta Robertson, and attest that it is an accurate representation of my H & P and other events of the outpatient visit except if otherwise noted.

## 2020-05-16 DIAGNOSIS — Z780 Asymptomatic menopausal state: Secondary | ICD-10-CM

## 2020-05-16 DIAGNOSIS — N952 Postmenopausal atrophic vaginitis: Secondary | ICD-10-CM

## 2020-05-17 ENCOUNTER — Ambulatory Visit: Payer: BC Managed Care – POS | Attending: Rheumatology

## 2020-05-17 ENCOUNTER — Ambulatory Visit: Payer: BLUE CROSS/BLUE SHIELD

## 2020-05-17 DIAGNOSIS — M542 Cervicalgia: Secondary | ICD-10-CM

## 2020-05-17 DIAGNOSIS — K224 Dyskinesia of esophagus: Secondary | ICD-10-CM

## 2020-05-17 DIAGNOSIS — M19042 Primary osteoarthritis, left hand: Secondary | ICD-10-CM

## 2020-05-17 DIAGNOSIS — G4733 Obstructive sleep apnea (adult) (pediatric): Secondary | ICD-10-CM

## 2020-05-17 DIAGNOSIS — Z8616 History of 2019 novel coronavirus disease (COVID-19): Secondary | ICD-10-CM

## 2020-05-17 DIAGNOSIS — E213 Hyperparathyroidism, unspecified: Secondary | ICD-10-CM

## 2020-05-17 DIAGNOSIS — Z87898 Personal history of other specified conditions: Secondary | ICD-10-CM

## 2020-05-17 DIAGNOSIS — M19041 Primary osteoarthritis, right hand: Secondary | ICD-10-CM

## 2020-05-17 NOTE — Patient Instructions
???    Follow Up: as needed.    - Referral to occupational therapy for osteoarthritis of the hands.     - Consider traumeel cream OTC or prescription.     - Consider paraffin bath for hands osteoarthritis.     - Continue low dose flexeril 2.5-5 mg QHS for myofascial neck pain.     - Continue magnesium citrate 400 mg daily and calcium citrate 500 mg in combination with 250 mg daily.  ???  - Continue turmeric, ginger, and fish oil supplementation.     - Continue beneficial lifestyle modifications.    - Follow with Dr. Augusto Garbe recommendations.     - Follow with Dr. Josiah Lobo recommendations.    - Follow with Dr. Lorenza Chick for vitamin D supplementation recommendations.  ??????  - Advised patient of the following:    -- You should always schedule an appointment to discuss your results.  Alternatively, always contact me for  your results and I can e-mail them via mychart or mail them.  You should always hear from me about your test results.   -- If not already done, please set up your online charting account to receive emails and check your labs. I recommend setting up mychart for online access to your test results.  Mychart messages should be reserved for non-urgent and BRIEF questions.  For urgent messages, please call and/or schedule an appointment.  These messages are only received during business hours.   -- Also if you would like to get a list of things ordered prior to getting them done, please ask the front desk for the after visit summary (AVS) before getting labs or imaging done.  -- Please check with your insurance about your financial responsibility for services rendered (consultation, laboratories and radiological studies).  -- If you get labs done at an outside facility please call within 24 hours to notify us to ensure we receive those results.

## 2020-05-17 NOTE — H&P
PATIENT:  Robin Spencer  MRN:  [id number]  DOB:  02-28-1951  DATE OF SERVICE:  05/17/2020    PRIMARY CARE PROVIDER: Mackena Plummer, Weyman Croon., MD    Cc: Welcome to [employer] Initial Preventive Physical Examination OR Follow up Annual Wellness Visit Follow up and Follow up of Chronic Conditions  Subjective:     Robin Spencer is a a 70 y.o. female who presents for Welcome to Medicare Initial Preventive Physical Examination or Annual Wellness Visit Follow up  and discussion and follow up of chronic conditions as listed below.    Note: This is a one time examination performed within 12 months of the first effective date of Medicare Part B coverage.  [id number]; or as a follow up 12 months after the last Annual Wellness Exam [phone]    1. SNHL:  Hearing aids good.     2.  OSA:  Using CPAP on nose, states ''wonderful''-- feels more rested.     3.  Osteoporosis:  On TAM.     4.  Invasive ductal carcinoma of left breast:  Saw Dr. Maye Hides, continuing.     5.  Increased PTH level; PTH pending with bone markers.     6. Esophageal dysmotility:  Improved.  Bravo study not consistent with GERD.     7.  Vaginal atrophy: On Uvafem, feels well.      Other MDs seen in continuity:  Dr. Marton Redwood (primary- [place], [place]), Dr. Ladona Ridgel (rheumatology), Dr. Lois Huxley (ophthalmology- [place], [place]), Dr. Maye Hides (oncology), Dr. Hetty Ely (endocrinology, osteoporosis, parathyroid).  Dr. Alvina Filbert (GI), Dr. Hollice Gong (dermatology).     Patient Active Problem List    Diagnosis Date Noted   ??? Esophageal motility disorder 05/17/2020     2022:  Esophageal dysmotility:  Improved.  Bravo study not consistent with GERD.     ??? History of COVID-19 05/17/2020     02/2020, dx'd in [place]     ??? Vaginal atrophy 05/16/2020   ??? Schatzki's ring 12/01/2019     On EGD 2021 By Dr. Cleda Clarks, Bravo placed.  No Barrett's esophagus.      ??? Dyspepsia      2021 with sx, saw Dr. Donnamarie Poag, plan: PPI, will plan for EGD w/ Bravo to confirm or disprove reflux given atypical presentation of symptoms. EGD 2021 Dr. Cleda Clarks:  ESOPHAGUS, PROXIMAL, INLET PATCH (BIOPSY):  - Gastric oxyntic heteropia, consistent with inlet patch- No intestinal metaplasia, BRAVO placed. BRAVO:  Negative study for gastroesophageal reflux disease.  Consider workup of supragastric belching if clinically indicated..  Plan follow up Dr. Donnamarie Poag.     ??? OSA (obstructive sleep apnea) 11/07/2019   ??? Other fracture of right great toe, initial encounter for closed fracture 05/06/2019     2021:  Patient presents with right big toe pain, swelling, and tenderness after trauma last night. X-ray completed during this visit confirms fracture of big toe.  - Reviewed x-ray findings and images with patient  - STAT referral to patient's podiatrist for ongoing management  - Provided post-op shoe with firm sole and crutches to patient, discussed importance of avoiding any bending of the toe  - Reviewed symptomatic care with Tylenol/Ibuprofen PRN, elevation, ice  ???  ???      Orders Placed This Encounter   ??? Referral to Podiatry   ???       ??? Mucous cyst of digit of left hand 06/02/2018   ??? Fibromyalgia 05/10/2018     Better sleep with flexeril       ???  Rash, Intermittent, noted after colds 04/29/2018   ??? Asymptomatic varicose veins 03/01/2018   ??? Vitreous floater, bilateral 06/11/2017     No retinal detachment, retinal tear, or vitreous hemorrhage on dilated fundus exam. Signs and symptoms reviewed.     ??? Increased PTH level 05/08/2017     Sees Dr. Selena Batten in endocrinology.      ??? Syncope and collapse 06/25/2016     2018, ECG/Labs/MRI w/o gad unrevealing, saw neurology Dr. Lorina Rabon, plan; EEG, followup      ??? Immune to measles 05/09/2016     (+) Ab 2018     ??? Invasive ductal carcinoma of left breast, ER+PR+HER2- 01/29/2016     2017, Bilateral tomosynthesis screening mammogram on 12/11/2015. There were no suspicious findings seen. She had postsurgical changes in both breasts. Patient then developed bilateral breast pain, left greater than right. She was sent for bilateral diagnostic ultrasounds on 01/15/2016. The right breast was negative. However, in the left breast, there was an irregular mass seen with indistinct margins, measuring 5 x 6 x 4 mm, at 12 o'clock, 6 cm from the nipple. Her left axilla was negative. Genetic testing: NO MUTATION DETECTED/This panel tests three Founder Mutations in the BRCA1/2 genes (BRCA1 187delAG, BRCA1 5385insC, BRCA2 6174delT; lumpectomy: sentinal node negative x 2.  TAM recommended by oncology, annual endometrial surveillance given history by GYN.      ??? Breast pain 01/09/2016     [facility] plan per NP, imaging, lifestyle changes     ??? Cataract, nuclear sclerotic, both eyes 11/29/2015     Not yet visually significant. Continue observation.      ??? Status post LASIK surgery of both eyes 11/29/2015     Performed by Dr. Ellin Goodie (2000 ?). Originally set with left eye for distance and low myopia for right.     ??? Fibroid 04/09/2015     2017 on ultrasound.      ??? BPPV (benign paroxysmal positional vertigo) 07/19/2013   ??? Menopause 04/14/2013   ??? Seborrheic dermatitis 05/11/2012   ??? 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 PPD positive 04/07/2012   ??? Palpitations 04/07/2012     2009 ER PACs, PVCs, sinus arrythmia, ECHO, Stress ECHO WNL 2009     ??? OA (osteoarthritis) 04/07/2012     Hands 2011, anti CCP neg, no evidence inflammation     ??? Visit for preventive health examination 04/07/2012     Colo noscopy= hyperplastic polyps 2017     ??? Hearing loss, right 04/07/2012     Otosclerosis, s/p stapedectomy     ??? Eye allergies 01/08/2012     Not having significant symptoms at this time, but could use over the counter anti-histamine eye drops, like ketotifen or artificial tears as needed if symptoms recur.     ??? Osteoporosis 11/10/2011     DEXA 2013, spine-2.4, femoral neck -2.9. On HT for menopause sx and as osteoporosis rx.; PA lumbar spine T-score of -2.7, will begin TAM per oncology.            Allergies   Allergen Reactions   ??? Penicillins Other (See Comments) and Rash     Ampicillin, flesh rash in 1980s   ??? Menadiol Sodium Diphosphate Rash   ??? Other      [place] coloring on meds such as advil- not allergic to actual med- just the coloring   ??? Penicillin G        Current Outpatient Medications (GI Meds)  Medication Sig Dispense Refill   ??? MAGNESIUM CITRATE PO* Take 150 mg by mouth two (2) times daily.       Current Outpatient Medications (Nutrition)   Medication Sig Dispense Refill   ??? BIOTIN PO Take 1 tablet by mouth daily .       ??? calcium carbonate (SUPER CALCIUM) 1500 (600 Ca) mg tablet Take 600 mg by mouth daily.     ??? CALCIUM CITRATE PO Take 630 mg by mouth daily.     ??? Cholecalciferol (VITAMIN D3) 5000 units CAPS Take 1 capsule by mouth daily.     ??? Magnesium 250 MG TABS Take 250 mg by mouth daily.     ??? MAGNESIUM CITRATE PO* Take 150 mg by mouth two (2) times daily.     ??? Omega-3 Fatty Acids (FISH OIL PO) Take 1,400 mg by mouth two (2) times daily.       Current Outpatient Medications (GU Meds)   Medication Sig Dispense Refill   ??? estradiol (YUVAFEM) 10 mcg vaginal tablet Place 1 tablet (10 mcg total) vaginally two (2) times a week. 24 tablet 1     Current Outpatient Medications (Heme Meds)   Medication Sig Dispense Refill   ??? Cyanocobalamin (VITAMIN B12) 1000 MCG TBCR Take 1,000 mcg by mouth daily.     ??? cyanocobalamin 500 mcg tablet Take 500 mcg by mouth daily.       Current Outpatient Medications (Chemotherapy)   Medication Sig Dispense Refill   ??? TAMOXIFEN 20 mg tablet TAKE 1 TABLET BY MOUTH EVERY DAY 90 tablet 1   ??? tamoxifen 20 mg tablet Take 20 mg by mouth daily.       Current Outpatient Medications (Antibiotics)   Medication Sig Dispense Refill   ??? ACYCLOVIR 400 mg tablet TAKE 1 TABLET (400 MG TOTAL) BY MOUTH TWO (2) TIMES DAILY. 180 tablet 1   ??? acyclovir 400 mg tablet Take 400 mg by mouth daily.       Current Outpatient Medications (Derm Meds)   Medication Sig Dispense Refill   ??? triamcinolone 0.5% cream Apply topically as needed for. (Patient not taking: Reported on 05/17/2020.) 1 tube 1     Current Outpatient Medications (Miscellaneous)   Medication Sig Dispense Refill   ??? Homeopathic Products (TRAUMEEL) OINT Apply 4 g topically daily. 50 g 3   ??? Moringa 500 MG CAPS Take 500 mg by mouth daily.       Current Outpatient Medications (Other)   Medication Sig Dispense Refill   ??? UNABLE TO FIND Med Name: [place] supplement- Epimedium, Tumeric, Japanese, Fleeceflower, EPA/DHA .       * These medications belong to multiple therapeutic classes and are listed under each applicable group.         Past Medical History:   Diagnosis Date   ??? Anxiety    ??? Breast cancer (HCC/RAF)    ??? Eczema    ??? Fibroids    ??? GERD (gastroesophageal reflux disease)     esophagitis   ??? History of migraine headaches    ??? History of radiation therapy 04/2016    L breast   ??? OSA (obstructive sleep apnea) 11/07/2019   ??? Otosclerosis    ??? Post-operative nausea and vomiting    ??? Rectal fissure    ??? Vitreous detachment        Past Surgical History:   Procedure Laterality Date   ??? BREAST BIOPSY  1986    Fibroadenoma, ductal hyperplasia   ???  Laparoscopy for subfertility     ??? Left finger cyst removal x 2     ??? Stapedectomy, right         Family History   Problem Relation Age of Onset   ??? Hypertension Father    ??? Stroke Father    ??? Hyperlipidemia Father    ??? Depression Father    ??? Prostate cancer Father 67   ??? Hypertension Mother    ??? Asthma Mother    ??? Endometrial cancer Mother 28   ??? Hyperthyroidism Mother    ??? Prostate cancer Brother 32   ??? Breast cancer Neg Hx    ??? Ovarian cancer Neg Hx    ??? Malignant hypertension Neg Hx        Shirline Kendle  reports that she has never smoked. She has never used smokeless tobacco. She reports that she does not drink alcohol and does not use drugs. Lives with her husband in [place]. Ballroom dancing several times a week, hiking.  One son in [place] area, getting married.  Other son living in [place].     Opioid use: None.     Diet: Lots of fruits, vegetable, proteins, some carbs.     Physical Activities, including limitations: Ballroom dancing several times a week, hiking.     Self assessment of health:  Excellent, better than ever.     Social support: Husband, lots of friends.     Activities of Daily Living (note any not independent):   bathing: personal hygiene and grooming; dressing: dressing and undressing; transferring: movement and mobility; toileting: continence-related tasks including control and hygiene; eating: preparing food and feeding    Instrumental Activities of Daily Living (note any not independent): cooking, cleaning, finances, shopping, healthcare and medication, using telephones and technology, transportation, and caring for other individuals and pets.    Fall risk:      (1) Falls in the past year? No  (2) Injury from fall in the past year? No  (3) Feels unsteady when standing or walking? No  (4) Worried about falling?  No    Home Safety:      (1) Are you worried about clutter in your home? No  (2) Do you have poor lighting by day or night in your home? No  (3) Are there slippery surfaces in your home? No- railing, carpeted tile stairs.   (4) Are you worried about falling when using your toilet, bath or shower facilities? No.         Goals of Care/DPA:  Has not yet done form, information/form given.  Would want husband to make decisions if she could not.       Immunization History   Administered Date(s) Administered   ??? COVID-19 [place] SARS-CoV-2 vaccine, unspecified 04/06/2019   ??? COVID-19, mRNA, (Moderna) 100 mcg/0.5 mL 04/06/2019, 05/04/2019, 01/09/2020   ??? Hepatitis A, Adult 12/10/2006, 04/24/2016   ??? Hepatitis A, unspecified formulation 12/10/2006   ??? Influenza vaccine IM quadrivalent (Afluria Quad) (PF) SYR (65 years of age and older) 12/22/2018   ??? Tdap 04/26/2010, 01/24/2017, 12/22/2018   ??? Typhoid Inactivated, ViCPs 04/24/2016   ??? influenza vaccine IM quadrivalent (Fluzone Quad) (PF) SYR/SDV (89 months of age and older) 02/14/2014   ??? influenza vaccine IM quadrivalent adjuvanted (FluAD Quad) (PF) SYR (64 years of age and older) 12/20/2019   ??? influenza vaccine IM quadrivalent high dose (Fluzone High Dose Quad) (PF) SYR (62 years of age and older) 12/08/2018   ??? influenza vaccine  IM trivalent adjuvanted (FluAD) (PF) SYR (93 years of age and older) 01/12/2018   ??? influenza vaccine IM trivalent high dose (Fluzone High Dose) (PF) SYR (32 years of age and older) 01/16/2017   ??? influenza, unspecified formulation 02/01/1999, 01/22/2000, 01/28/2002, 02/14/2014, 02/01/2016, 01/12/2018   ??? pneumococcal polysaccharide vaccine 23-valent (Pneumovax) 05/18/2018   ??? zoster live vaccine (Zostavax) 04/15/2012   ??? zoster vac recomb adjuvanted (Shingrix) 06/26/2016, 06/02/2017         Health Maintenance   Topic Date Due   ??? Advance Directive  Never done   ??? Pneumococcal Vaccine (2 of 2 - PCV13) 05/18/2019   ??? Cervical Ca Screening: HPV Testing  04/08/2020   ??? Cervical Ca Screening: PAP Smear  04/08/2020   ??? Breast Ca Screening: MAMMOGRAM  12/01/2020   ??? Annual Preventive Wellness Visit  05/17/2021   ??? Colorectal Cancer Screening  10/07/2025   ??? Tdap/Td Vaccine (4 - Td or Tdap) 12/21/2028   ??? Influenza Vaccine  Completed   ??? Shingles (Zostavax) Vaccine  Completed   ??? Osteoporosis Early Detection DEXA Scan  Completed   ??? Hepatitis C Screening  Completed   ??? Shingles (Shingrix) Vaccine  Completed   ??? COVID-19 Vaccine  Completed         Review of Systems:     Depression screen:    (1) During the past month, have you been bothered by feeling down, depressed or helpless? No  (2) During the past month, have you often been bothered by little interest or pleasure in doing things? No    Hearing: Declines hearing aid evaluation, working fine now.     Vision:  Will see eye MD in [place].     Const:  No significant weight gain or loss  Cor:  No exertional chest pain.  Pulm:  No exertional dyspnea.  Endo:  Postmenopausal GU:  Sexually active with female partner.   Skin: No irregular or growing skin lesions- had skin cancer check.           Objective:      BP 114/78 (BP Location: Left arm, Patient Position: Sitting, Cuff Size: Regular)  ~ Pulse 81  ~ Temp 36.4 ???C (97.5 ???F) (Forehead)  ~ Resp 18  ~ Ht 5' 3'' (1.6 m)  ~ Wt 123 lb 4.8 oz (55.9 kg)  ~ LMP  (LMP Unknown)  ~ SpO2 99%  ~ BMI 21.84 kg/m???   Body mass index is 21.84 kg/m???.        System Check if normal Positive or additional negative findings   Constit  [x]  General appearance     Eyes  []  Conj/Lids []  Pupils  []  Fundi     HENMT  []  External ears/nose []  Otoscopy   []  Gross Hearing []  Nasal mucosa   []  Lips/teeth/gums []  Oropharynx    []  mucus membranes []  Head     Neck  [x]  Inspection/palpation [x]  Thyroid     Resp  [x]  Effort []  Wheezing    [x]  Auscultation  []  Crackles     CV  [x]  Rhythm/rate   [x]  Murmurs   []  LEE   []  JVP non-elevated    Normal pulses:   []  Radial []  Femoral  []  Pedal     Breast  [x]  Inspection [x]  Palpation     GI  [x]  abd masses    [x]  tenderness   []  rebound/guarding   []  Liver/spleen []  Rectal     GU  M: []  Scrotum []  Penis []   Prostate   F:  []  External []  vaginal wall        []  Cervix  []  mucus        []  Uterus    []  Adnexa      Lymph  [x]  Neck [x]  Axillae []  Groin     MSK Specify site examined:    []  Inspect/palp []  ROM   []  Stability []  Strength/tone         Skin  []  Inspection []  Palpation Sun damage changes, SK.    Neuro  []  CN2-12 intact grossly   []  Alert and oriented   []  DTR      []  Muscle strength      []  Sensation   []  Gait/balance     Psych  [x]  Insight/judgement     [x]  Mood/affect    [x]  Gross cognition        Patient informed of chaperone program: Patient Declined        LABS:  Lab Results   Component Value Date    WBC 5.37 05/12/2019    HGB 13.5 05/12/2019    HCT 40.2 05/12/2019    MCV 93.7 05/12/2019    PLT 228 05/12/2019     Lab Results   Component Value Date    CREAT 0.71 05/17/2020    BUN 15 05/17/2020    NA 144 05/17/2020    K 4.0 05/17/2020    CL 105 05/17/2020    CO2 27 05/17/2020     Lab Results   Component Value Date    ALT 16 05/17/2020    AST 21 05/17/2020    ALKPHOS 52 05/17/2020    BILITOT 0.5 05/17/2020     Lab Results   Component Value Date    CHOL 188 05/17/2020    CHOLDLCAL 144 (H) 04/20/2014    TRIGLY 99 04/20/2014     Lab Results   Component Value Date    TSH 0.94 05/12/2019     No results found for: HGBA1C        [place] left breast, screening    Result Date: 12/05/2019  Finding 1:  There is no mammographic evidence of malignancy.  Finding 2:  There is no sonographic evidence of malignancy.  Routine screening in 1 year is recommended.  The patient has a history of breast cancer. The Tyrer-Cuzick risk model does not apply.  BI-RADS Category 2: Benign Finding(s)     Report electronically signed by: Judeth Cornfield Lee-Felker   I, Sherre Scarlet, M.D., have reviewed and edited this report and I concur with the final assessment.  Report electronically signed by: Judeth Cornfield Lee-Felker  12/05/2019 at 09:42:24 AM  Technologist: Hettie Holstein                                                       [place] right breast, screening    Result Date: 12/05/2019  Finding 1:  There is no mammographic evidence of malignancy.  Finding 2:  There is no sonographic evidence of malignancy.  Routine screening in 1 year is recommended.  The patient has a history of breast cancer. The Tyrer-Cuzick risk model does not apply.  BI-RADS Category 2: Benign Finding(s)     Report electronically signed by: Judeth Cornfield Lee-Felker   I, Sherre Scarlet, M.D., have reviewed and edited this report and I concur with  the final assessment.  Report electronically signed by: Judeth Cornfield Lee-Felker  12/05/2019 at 09:42:24 AM  Technologist: Hettie Holstein                                                       Mammo tomosynthesis, screening, bilat breast    Result Date: 12/05/2019  Finding 1:  There is no mammographic evidence of malignancy.  Finding 2:  There is no sonographic evidence of malignancy. Routine screening in 1 year is recommended.  The patient has a history of breast cancer. The Tyrer-Cuzick risk model does not apply.  BI-RADS Category 2: Benign Finding(s)     Report electronically signed by: Judeth Cornfield Lee-Felker   I, Sherre Scarlet, M.D., have reviewed and edited this report and I concur with the final assessment.  Report electronically signed by: Judeth Cornfield Lee-Felker  12/05/2019 at 09:42:24 AM  Technologist: Hettie Holstein                                                             Assessment & Plan:     Assessment:   1. SNHL:  Hearing aids good.     2.  OSA:  Using CPAP on nose, states ''wonderful''-- feels more rested.     3.  Osteoporosis:  On TAM.     4.  Invasive ductal carcinoma of left breast:  Saw Dr. Maye Hides, continuing.     5.  Increased PTH level; PTH pending with bone markers.     6. Esophageal dysmotility:  Improved.  Bravo study not consistent with GERD.     7.  Vaginal atrophy: On Uvafem, feels well.        Annual Preventive Exam  Patient Active Problem List   Diagnosis   ??? Osteoporosis   ??? Eye allergies   ??? Genital HSV   ??? PPD positive   ??? Palpitations   ??? OA (osteoarthritis)   ??? Visit for preventive health examination   ??? Hearing loss, right   ??? Seborrheic dermatitis   ??? Menopause   ??? BPPV (benign paroxysmal positional vertigo)   ??? Fibroid   ??? Cataract, nuclear sclerotic, both eyes   ??? Status post LASIK surgery of both eyes   ??? Breast pain   ??? Invasive ductal carcinoma of left breast, ER+PR+HER2-   ??? Immune to measles   ??? Syncope and collapse   ??? Increased PTH level   ??? Vitreous floater, bilateral   ??? Asymptomatic varicose veins   ??? Rash, Intermittent, noted after colds   ??? Fibromyalgia   ??? Mucous cyst of digit of left hand   ??? Other fracture of right great toe, initial encounter for closed fracture   ??? OSA (obstructive sleep apnea)   ??? Dyspepsia   ??? Schatzki's ring   ??? Vaginal atrophy   ??? Esophageal motility disorder   ??? History of COVID-19     Health Maintenance Topic Date Due   ??? Advance Directive  Never done   ??? Pneumococcal Vaccine (2 of 2 - PCV13) 05/18/2019   ??? Cervical Ca Screening: HPV Testing  04/08/2020   ??? Cervical Ca Screening: PAP  Smear  04/08/2020   ??? Breast Ca Screening: MAMMOGRAM  12/01/2020   ??? Annual Preventive Wellness Visit  05/17/2021   ??? Colorectal Cancer Screening  10/07/2025   ??? Tdap/Td Vaccine (4 - Td or Tdap) 12/21/2028   ??? Influenza Vaccine  Completed   ??? Shingles (Zostavax) Vaccine  Completed   ??? Osteoporosis Early Detection DEXA Scan  Completed   ??? Hepatitis C Screening  Completed   ??? Shingles (Shingrix) Vaccine  Completed   ??? COVID-19 Vaccine  Completed         Personalized Prevention Plan Services and  Management of Medical Conditions:     Health Maintenance   Topic Date Due   ??? Advance Directive  Never done   ??? Pneumococcal Vaccine (2 of 2 - PCV13) 05/18/2019   ??? Cervical Ca Screening: HPV Testing  04/08/2020   ??? Cervical Ca Screening: PAP Smear  04/08/2020   ??? Breast Ca Screening: MAMMOGRAM  12/01/2020   ??? Annual Preventive Wellness Visit  05/17/2021   ??? Colorectal Cancer Screening  10/07/2025   ??? Tdap/Td Vaccine (4 - Td or Tdap) 12/21/2028   ??? Influenza Vaccine  Completed   ??? Shingles (Zostavax) Vaccine  Completed   ??? Osteoporosis Early Detection DEXA Scan  Completed   ??? Hepatitis C Screening  Completed   ??? Shingles (Shingrix) Vaccine  Completed   ??? COVID-19 Vaccine  Completed     Orders Placed This Encounter   ??? DXA lumbar spine+hip   ??? Comprehensive Metabolic Panel   ??? TSH   ??? Chol,LDL,Quant   ??? Cholesterol   ??? Cholesterol, HDL   ??? calcium carbonate (SUPER CALCIUM) 1500 (600 Ca) mg tablet   ??? Magnesium 250 MG TABS   ??? Moringa 500 MG CAPS   ??? acyclovir 400 mg tablet   ??? Cyanocobalamin (VITAMIN B12) 1000 MCG TBCR   ??? tamoxifen 20 mg tablet     Personalized Health Advice (if applicable, includes referrals to health education or preventive counseling services or programs):      Patient Instructions   Please send a copy of your Durable Power of Attorney/Advanced Heathcare Planning Form to our office so we can put it in our computer system.  This will make it available to all your physicians, the Emergency Department, and our hospitals, if needed.     Have an eye exam with Dr. Kyra Leyland.     Consider ''Prevnar 13''  Pneumonia shot.     Return in about 1 year (around 05/17/2021) for Annual Wellness- 30 minutes, Please give Advance Care Planning Form.        The above plan of care, diagnosis, order, and follow-up were discussed with the patient. Questions related to this recommended plan of care were answered.    Verbal or written information was provided to the patient about end of life planning/advanced care planning.     The services and diagnoses listed above in addition to the Medicare Annual Visit constituted separate and distinct services and diagnoses that were addressed and discussed with the patient today.  Author:  Weyman Croon. Roniel Halloran 05/17/2020 11:23 AM

## 2020-05-23 ENCOUNTER — Telehealth: Payer: MEDICARE | Attending: "Endocrinology

## 2020-05-23 DIAGNOSIS — E7800 Pure hypercholesterolemia, unspecified: Secondary | ICD-10-CM

## 2020-05-23 DIAGNOSIS — E349 Endocrine disorder, unspecified: Secondary | ICD-10-CM

## 2020-05-23 DIAGNOSIS — M810 Age-related osteoporosis without current pathological fracture: Secondary | ICD-10-CM

## 2020-05-23 NOTE — Patient Instructions
1.  Please schedule your bone density scan at [facility] in [place] by calling [phone] ([address], [address], [place], [place] [address]).  2.  Get your labs at [facility] next month.  3.  Someone from my office will call you to schedule a follow up appointment in 1 year

## 2020-05-23 NOTE — Progress Notes
OUTPATIENT ENDOCRINOLOGY CONSULTATION    Date of Service: 05/23/2020  Referring Provider: Lorenza Chick., MD  PMD: Pregler, Weyman Croon., MD    Chief Complaint:   Osteoporosis     History of Present Illness:   Robin Spencer is a 70 y.o. female who has a past medical history of Anxiety, Breast cancer (HCC/RAF), Eczema, Fibroids, GERD (gastroesophageal reflux disease), History of migraine headaches, History of radiation therapy (04/2016), OSA (obstructive sleep apnea) (11/07/2019), Otosclerosis, Post-operative nausea and vomiting, Rectal fissure, and Vitreous detachment. and presents to Endocrinology Clinic for Osteoporosis.    Osteoporosis  The patient was first found to have low bone density in 2013.    Fibromyalgia diagnosed by rheumatologist Dr. Ladona Ridgel, but pt does not feel like her pain is debilitating.    Prev on bioidentical hormones for HRT - stopped in 01/2016 or so upon dx of breast cancer.  Now on tamoxifen.    01/2017 thyroid US showed no nodules   05/2018:    Interval Events:  Last seen 05/2019:    Video visit today, consent obtained.     Face lift 3 weeks ago. Still recovering.    Two hiatal hernias. Not having significant GI symptoms now.    Living in [place], [place]. [place] elevation.  Has a local PCP.    No falls or fractures    Remains on tamoxifen    She had two dental implants in the last 6 months - 1 is still waiting for the crown.    She thinks she had less dairy for the couple weeks preceding the labs.    Vitamin D3 5000 IU daily   Calcium 600 mg daily  Also taking Mg 150 BID.  Dietary calcium: 1 cup yogurt, lots of kale/spinach daily in smoothie  Having less string cheese and almond milk         Treatment History   Dates Comments    [x]  No treatment to date            Risk Factors for Accelerated Bone Loss        History of fracture  []  Yes  [x]  No    History of falls  []  Yes  [x]  No    History of malabsorption  []  Yes  [x]  No    History of nephrolithiasis  []  Yes  [x]  No    History of rheumatoid arthritis  []  Yes  [x]  No    History of hyperthyroidism  []  Yes  [x]  No    History of Cushing's  []  Yes  [x]  No    Family history of osteoporosis  [x]  Yes  []  No Osteopenia in mother   Prior/current glucocorticoid use  []  Yes  [x]  No    Antiepilepsy drugs  []  Yes  [x]  No    PPI  []  Yes  []  No Prilosec in the past for weeks only   Aromatase inhibitors  []  Yes  [x]  No    Androgen deprivation therapy  []  Yes  [x]  No    EtOH  []  Yes  []  No Rare alcohol   Tobacco  []  Yes  [x]  No      Past Medical History:     Past Medical History:   Diagnosis Date   ??? Anxiety    ??? Breast cancer (HCC/RAF)    ??? Eczema    ??? Fibroids    ??? GERD (gastroesophageal reflux disease)     esophagitis   ??? History of migraine headaches    ???  History of radiation therapy 04/2016    L breast   ??? OSA (obstructive sleep apnea) 11/07/2019   ??? Otosclerosis    ??? Post-operative nausea and vomiting    ??? Rectal fissure    ??? Vitreous detachment        Past Surgical History:     Past Surgical History:   Procedure Laterality Date   ??? BREAST BIOPSY  1986    Fibroadenoma, ductal hyperplasia   ??? Laparoscopy for subfertility     ??? Left finger cyst removal x 2     ??? Stapedectomy, right       Medications:     Current Outpatient Medications   Medication Sig   ??? ACYCLOVIR 400 mg tablet TAKE 1 TABLET (400 MG TOTAL) BY MOUTH TWO (2) TIMES DAILY.   ??? acyclovir 400 mg tablet Take 400 mg by mouth daily.   ??? BIOTIN PO Take 1 tablet by mouth daily .     ??? calcium carbonate (SUPER CALCIUM) 1500 (600 Ca) mg tablet Take 600 mg by mouth daily.   ??? CALCIUM CITRATE PO Take 630 mg by mouth daily.   ??? Cholecalciferol (VITAMIN D3) 5000 units CAPS Take 1 capsule by mouth daily.   ??? Cyanocobalamin (VITAMIN B12) 1000 MCG TBCR Take 1,000 mcg by mouth daily.   ??? cyanocobalamin 500 mcg tablet Take 500 mcg by mouth daily.   ??? estradiol (YUVAFEM) 10 mcg vaginal tablet Place 1 tablet (10 mcg total) vaginally two (2) times a week.   ??? Homeopathic Products (TRAUMEEL) OINT Apply 4 g topically daily. ??? Magnesium 250 MG TABS Take 250 mg by mouth daily.   ??? MAGNESIUM CITRATE PO Take 150 mg by mouth two (2) times daily.   ??? M[place]a 500 MG CAPS Take 500 mg by mouth daily.   ??? Omega-3 Fatty Acids (FISH OIL PO) Take 1,400 mg by mouth two (2) times daily.   ??? TAMOXIFEN 20 mg tablet TAKE 1 TABLET BY MOUTH EVERY DAY   ??? tamoxifen 20 mg tablet Take 20 mg by mouth daily.   ??? triamcinolone 0.5% cream Apply topically as needed for. (Patient not taking: Reported on 05/17/2020.)   ??? UNABLE TO FIND Med Name: [place] supplement- Epimedium, Tumeric, Japanese, Fleeceflower, EPA/DHA .     No current facility-administered medications for this visit.     Allergies:     Allergies   Allergen Reactions   ??? Penicillins Other (See Comments) and Rash     Ampicillin, flesh rash in 1980s   ??? Menadiol Sodium Diphosphate Rash   ??? Other      [place] coloring on meds such as advil- not allergic to actual med- just the coloring   ??? Penicillin G      Social History:    reports that she has never smoked. She has never used smokeless tobacco. She reports that she does not drink alcohol and does not use drugs.   Social History     Social History Narrative    Patient is married, and has two children. She is of Ashkenazi Agricultural engineer.        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 Hypertension Father    ???  Stroke Father    ??? Hyperlipidemia Father    ??? Depression Father    ??? Prostate cancer Father 38   ??? Hypertension Mother    ??? Asthma Mother    ??? Endometrial cancer Mother 29   ??? Hyperthyroidism Mother    ??? Prostate cancer Brother 25   ??? Breast cancer Neg Hx    ??? Ovarian cancer Neg Hx    ??? Malignant hypertension Neg Hx      Review of Systems:   14 system ROS negative except as described above and in the HPI.  Physical Examination:   Vital Signs: LMP  (LMP Unknown)  There is no height or weight on file to calculate BMI.   Wt Readings from Last 3 Encounters:   05/17/20 123 lb (55.8 kg)   05/17/20 123 lb 4.8 oz (55.9 kg)   12/02/19 127 lb 6.4 oz (57.8 kg)      Gen: WD,WN, conversant  Psych: normal affect    Laboratory Results:   I have   [] reviewed radiology,  [] reviewed labs,  [] reviewed & summarized old records, [] requested outside medical records.    Lab Results   Component Value Date    CALCIUM 9.3 05/17/2020    CALCIUM 9.3 05/17/2020    ICALCOR 1.10 05/12/2019    CREAT 0.71 05/17/2020    ALBUMIN 4.5 05/17/2020    CREATRANUR 9 (L) 05/17/2020    PTHINT 64 (H) 05/17/2020    VITD25OH 69 (H) 05/17/2020    TSH 1.6 05/17/2020    T4AUTO 1.3 01/22/2017    NTXRANUR 19 05/17/2020    OSTEOCALCIN 15 01/22/2017    ALKPHOS 52 05/17/2020       Lab Results   Component Value Date    WBC 5.37 05/12/2019    HGB 13.5 05/12/2019    HCT 40.2 05/12/2019    MCV 93.7 05/12/2019    PLT 228 05/12/2019       Bone Turnover Markers  Date UNTx BSAP Serum OC                Studies:   DXA 05/13/18 Lenell Antu)   Assessment:  1. The left hip T-score of -2.1 meets the criteria for osteopenia and reflects a change of +1.3% from the baseline exam of 04/2016.  The femoral neck T-score of -2.2 is consistent with osteopenia and reflects a change of +1.6%.  2. The right hip and femoral neck T-score's of -2.0 and -2.1 respectively, meet the criteria for osteopenia and represent the right femur baseline.  3. The PA lumbar spine T-score of -2.6 meets the criteria for osteoporosis according to the [facility] classification and reflects a change of +1.1%.    4. The *lateral lumbar spine T-score = n/a  *The lateral lumbar spine view provides a sensitive measure of trabecular bone and is primarily to be used to evaluate response to treatment. A low T-score in the lateral lumbar view alone does NOT fulfill the diagnostic criteria for osteopenia or osteoporosis. (ISCD Practice Recommendations 2006)   Z-scores used in reporting BMD for children/adolescents 5-19, pre-menopausal females and males < 50  ???    DXA 04/17/16 Lenell Antu)  Assessment:  1. The left hip and femoral neck T-score's of -2.2 and -2.3 respectively, meet the criteria for osteopenia.  2. The PA lumbar spine T-score of -2.7 meets the criteria for osteoporosis according to the [facility] classification.    3. The *lateral lumbar spine T-score = n/a    DXA 11/03/11 ([facility])  BASELINE  DUAL ENERGY X-RAY ABSORPTIOMETRY (DXA) STUDY  Bone densitometry was performed using a Hologic Discovery A bone densitometer.  ???  PA Spine:       The average bone mineral density from L1-4 is 0.787 g/cm2, 2.4 standard deviations below the mean for normal young adults (*T-scores) or 75% of normal. Compared to age and sex-matched controls (**Z-scores), this density is 0.9 standard deviations   below the mean.  ???  Total Hip:       The bone mineral density in the left total hip is 0.706 g/cm2, 1.9 standard deviations below the mean for normal young adults (*T-scores) or 75% of normal.  Compared to age and sex-matched controls (**Z-scores), this density is 1.0 standard   deviations below the mean.  ???  Femoral Neck:       The bone mineral density in the left femoral neck is 0.531 g/cm2, 2.9 standard deviations below the mean for normal young adults (*T-scores) or 63% of normal.  Compared to age and sex-matched controls (**Z-scores), this density is 1.6 standard   deviations below the mean.  ???  *T-score compares the BMD of an individual to peak bone mass and expresses the difference as a standard deviation score,**Z-score compares the BMD of an individual with age-matched, gender-matched, and race-matched controls and expresses the difference   as a standard deviation score.  ???  IMPRESSION:  1.  This is a baseline study at the [facility].  2.  The patient has osteoporosis.    Results for orders placed or performed during the hospital encounter of 02/11/17   US thyroid-parathyroid    Narrative    US THYROID PARATHYROID     CLINICAL HISTORY: thyroid nodule.    COMPARISON: None.    TECHNIQUE: Utilizing high resolution ultrasound transducers, real time 2D sonography and limited color Doppler imaging of the neck was performed.    FINDINGS:      The right thyroid lobe measures 4.7 x 1.1 x 1.1 cm and the left thyroid lobe measures 3.5 x 1.0 x 1.3 cm. The isthmus measures 2 mm in AP dimension.      The background thyroid parenchyma is homogeneous. Blood flow in the gland is normal on color Doppler.    No nodules are seen.    No parathyroid nodule is identified.    No morphologically abnormal or enlarged cervical chain lymph nodes.      Impression    IMPRESSION:    Normal ultrasound of the thyroid.      *From the American Thyroid Association 2015 Management Guidelines for Adult Patients with Thyroid Nodules:  High suspicion nodules: Recommend FNA if > 1 cm (Strong recommendation, Moderate-quality evidence);  Intermediate suspicion nodules: Recommend FNA if > 1 cm (Strong recommendation, Low-quality evidence);  Low suspicion nodules: Recommend FNA if > 1.5 cm (Weak recommendation, Low-quality evidence);  Very low suspicion nodules: Consider FNA if > 2 cm versus observation (Weak recommendation, Moderate-quality evidence);  Benign nodules: No biopsy (Strong recommendation, Moderate-quality evidence).    Dictated by: Mohammed Suhail   02/11/2017 12:02 PM    Signed by: Roanna Raider RAGAVENDRA   02/11/2017 12:56 PM             DEXA  Date LS T-score FN T-score TH T-score Rad T-score % Change BMD                Assessment:   Robin Spencer is a 70 y.o. female seen in the Endocrine clinic for evaluation and management  of osteoporosis.    Plan:   1. Osteoporosis, unspecified osteoporosis type, unspecified pathological fracture presence: diagnosed in 2013 on DXA, about stable on DXA in 2018 but on different machine.  DXA 2020 shows stability in BMD as well. Risk factors for accelerated bone loss: +fam hx, post-menopausal woman, Caucasian, thin.  - pt is hesitant to start medication and agree to hold off on now given stability of BMD.  - bone turnover markers 05/2020 look stable.  - PTH,Intact & Calcium; Future  - Vitamin D,25-Hydroxy; Future  - Encouraged weight bearing exercise  - Goal calcium intake is 1200 mg of elemental calcium per day, ideally more in diet than in supplement.  - Goal vitamin D intake is 800 IU of vitamin D3 per day. Goal vit D 25 OH level is >30.  - Next DXA is due - will get in [place] next month.    2. Hyperparathyroidism: Elevated PTH and normal calcium.  Differential diagnoses include secondary hyperparathyroidism (vitamin D deficiency, hypercalciuria, decreased renal function, low calcium intake) vs ''normocalcemic'' primary hyperparathyroidism. 24 hr urine calcium on the low side at 88 in 2018  - calcium 600 mg daily  - dietary calcium intake discussed  - continue D3 5000 IU daily  - repeat PTH, [place] next month with more regular dietary calcium intake    3. Invasive ductal carcinoma of left breast, ER+PR+HER2-  - on Tamoxifen    4. LDL high: borderline at 100  - no indication for statin  - work on diet      Patient Instructions   1.  Please schedule your bone density scan at [facility] in [place] by calling [phone] ([address], [address], [place], [place] [address]).  2.  Get your labs at [facility] next month.      Return in about 1 year (around 05/23/2021).     20 minutes spent preparing for this visit, obtaining history, evaluating/examining/counseling the patient, and documenting into the electronic health record.     Author:  Lorenza Chick, MD 05/23/2020 10:20 AM

## 2020-05-24 NOTE — Progress Notes
SCHDLD W/PT OTP FOR 05/24/21-VIDEO. PER PT IT IS A COVERED BENEFIT. VL 05/24/20

## 2020-06-24 MED ORDER — YUVAFEM 10 MCG VA TABS
ORAL_TABLET | 1 refills
Start: 2020-06-24 — End: ?

## 2020-06-25 MED ORDER — YUVAFEM 10 MCG VA TABS
ORAL_TABLET | 1 refills | Status: AC
Start: 2020-06-25 — End: ?

## 2020-07-10 ENCOUNTER — Ambulatory Visit: Payer: BC Managed Care – POS

## 2020-07-11 ENCOUNTER — Ambulatory Visit: Payer: BLUE CROSS/BLUE SHIELD

## 2020-07-11 DIAGNOSIS — Z780 Asymptomatic menopausal state: Secondary | ICD-10-CM

## 2020-07-11 DIAGNOSIS — E349 Endocrine disorder, unspecified: Secondary | ICD-10-CM

## 2020-07-12 ENCOUNTER — Ambulatory Visit: Payer: BLUE CROSS/BLUE SHIELD

## 2020-07-12 ENCOUNTER — Ambulatory Visit: Payer: PRIVATE HEALTH INSURANCE

## 2020-07-12 NOTE — Patient Instructions
Artificial Tears:  Over-the-counter drops that can soothe the surface of the eye. Look for products that say ''tears'' or ''lubricant eye drops''. In general, avoid ''redness relievers''. If you are using the drops more than 6 times a day consider using a non-preserved or preservative free version. In general, you can start using the drops as needed or 3-4 times a day.  Warm Compresses:  A wash cloth soaked in warm to hot tap water, held over the eyes for 3-4 minutes at least once a day, can help to improve the quality of the tears by melting the oils produced in the eyelids and moving them into the tears.  20/20/20: Because we don't blink as much when we are visually concentrating, like we do when we are on a computer or reading a book, taking frequent breaks during visual activities can minimize irritation. Try taking a 20 second break every 20 minutes, or so, and look at something 20 feet or further away. Let the eyes blink a bit and then resume your activity. This is also a great time to think about adding an artificial tear drop.

## 2020-07-12 NOTE — Progress Notes
Assessment and Plan     Problem List        Eye / Vision Problems    1. Cataract, nuclear sclerotic, both eyes     Overview      Not yet visually significant. Continue observation.          2. Status post LASIK surgery of both eyes     Overview      Performed by Dr. Corinda Gubler (2000 ?). Originally set with left eye for   distance and low myopia for right.                      Orders:  No orders of the defined types were placed in this encounter.      Follow ups:  Return in about 2 years (around 07/13/2022) for Dilated fundus exam.         I discussed the above assessment and plan with the patient. She had the opportunity to ask questions, and her questions and concerns were addressed. She was reminded to call if there is any significant change or worsening in vision, or to get an evaluation, urgently if appropriate.    Author:   Trudee Kuster, MD  This note details the assessment and plan of the encounter for this date. For a complete note and record of the encounter, please see the Encounter Summary.

## 2020-07-20 ENCOUNTER — Telehealth: Payer: PRIVATE HEALTH INSURANCE

## 2020-07-20 NOTE — Telephone Encounter
Patient advised that her PCP recommended that Dr. Maudie Mercury review the results with the patient.     Results Request - The patient would like to discuss the results of their recent tests.     1) What type of test(s)? Dexa Scan    2) When was it performed? 07/11/2020    3) Where was it performed? [place]     If [facility], are results available in [facility]? yes  If outside facility, what is their phone number?     Patient or caller has been notified of the 24-48 hour turnaround time.

## 2020-07-24 NOTE — Telephone Encounter
Reply by: Kelby Fam  Please let her know I would like her to schedule a f/u appointment with me so that we can discuss her scan - next availability is July.    Thanks,  Deatra Ina

## 2020-07-26 ENCOUNTER — Telehealth: Payer: BC Managed Care – POS

## 2020-07-26 ENCOUNTER — Ambulatory Visit: Payer: BC Managed Care – POS

## 2020-07-26 NOTE — Telephone Encounter
Called pt but got vm. LDM: reaching out to sch f/u appt w/Dr. Maudie Mercury to go over scans, c/b to sch appt. Currently sching in July. Sent mychart msg.

## 2020-07-26 NOTE — Telephone Encounter
Called pt but got vm. LDM: Dr. Maudie Mercury req f/u appt to go over scan, currently sching in July, c/b to sch appt. Sent mychart msg. JF 07/26/20

## 2020-07-27 ENCOUNTER — Telehealth: Payer: MEDICARE | Attending: "Endocrinology

## 2020-07-27 DIAGNOSIS — E7800 Pure hypercholesterolemia, unspecified: Secondary | ICD-10-CM

## 2020-07-27 DIAGNOSIS — M810 Age-related osteoporosis without current pathological fracture: Secondary | ICD-10-CM

## 2020-07-27 DIAGNOSIS — E349 Endocrine disorder, unspecified: Secondary | ICD-10-CM

## 2020-07-27 MED ORDER — ALENDRONATE SODIUM 70 MG PO TABS
70 mg | ORAL_TABLET | ORAL | 3 refills | Status: AC
Start: 2020-07-27 — End: ?

## 2020-07-27 NOTE — Progress Notes
OUTPATIENT ENDOCRINOLOGY CONSULTATION    Date of Service: 07/31/2020  Referring Provider: Lorenza Chick., MD  PMD: Pregler, Weyman Croon., MD    Chief Complaint:   Osteoporosis     History of Present Illness:   Robin Spencer is a 70 y.o. female who has a past medical history of Anxiety, Breast cancer (HCC/RAF), Cataract, Eczema, Fibroids, GERD (gastroesophageal reflux disease), History of migraine headaches, History of radiation therapy (04/2016), OSA (obstructive sleep apnea) (11/07/2019), Otosclerosis, Post-operative nausea and vomiting, Rectal fissure, and Vitreous detachment. and presents to Endocrinology Clinic for Osteoporosis.    Osteoporosis  The patient was first found to have low bone density in 2013.    Fibromyalgia diagnosed by rheumatologist Dr. Ladona Ridgel, but pt does not feel like her pain is debilitating.    Prev on bioidentical hormones for HRT - stopped in 01/2016 or so upon dx of breast cancer.  Now on tamoxifen.    01/2017 thyroid US showed no nodules    Interval Events:  Last seen 05/2020: pt is hesitant to start osteoporosis medication and agree to hold off on now given stability of BMD.    Video visit today, consent obtained.     DXA 07/11/20 showed worsening BMD, with T-score of -3.2 in the lumbar spine.  Citracal 630 mg     She was having cream of wheat or cream of rice to help with her calcium intake, but no longer.    Living in [place], [place]. [place]000 [place] elevation.  Has a local PCP.    No falls or fractures    Remains on tamoxifen    She has had four dental implants in the past. Has crown that will be placed and a filling as well, to be done in the next few weeks.    She thinks she had less dairy for the couple weeks preceding the labs.    Vitamin D3 5000 IU daily   Calcium 600 mg daily  Also taking Mg 150 BID.  Dietary calcium: 1 cup yogurt, lots of kale/spinach daily in smoothie  Having less string cheese and almond milk         Treatment History   Dates Comments    [x]  No treatment to date Risk Factors for Accelerated Bone Loss        History of fracture  []  Yes  [x]  No    History of falls  []  Yes  [x]  No    History of malabsorption  []  Yes  [x]  No    History of nephrolithiasis  []  Yes  [x]  No    History of rheumatoid arthritis  []  Yes  [x]  No    History of hyperthyroidism  []  Yes  [x]  No    History of Cushing's  []  Yes  [x]  No    Family history of osteoporosis  [x]  Yes  []  No Osteopenia in mother   Prior/current glucocorticoid use  []  Yes  [x]  No    Antiepilepsy drugs  []  Yes  [x]  No    PPI  []  Yes  []  No Prilosec in the past for weeks only   Aromatase inhibitors  []  Yes  [x]  No    Androgen deprivation therapy  []  Yes  [x]  No    EtOH  []  Yes  []  No Rare alcohol   Tobacco  []  Yes  [x]  No      Past Medical History:     Past Medical History:   Diagnosis Date   ? Anxiety    ?  Breast cancer (HCC/RAF)    ? Cataract    ? Eczema    ? Fibroids    ? GERD (gastroesophageal reflux disease)     esophagitis   ? History of migraine headaches    ? History of radiation therapy 04/2016    L breast   ? OSA (obstructive sleep apnea) 11/07/2019   ? Otosclerosis    ? Post-operative nausea and vomiting    ? Rectal fissure    ? Vitreous detachment        Past Surgical History:     Past Surgical History:   Procedure Laterality Date   ? BREAST BIOPSY  1986    Fibroadenoma, ductal hyperplasia   ? Laparoscopy for subfertility     ? Left finger cyst removal x 2     ? Stapedectomy, right       Medications:     Current Outpatient Medications   Medication Sig   ? ACYCLOVIR 400 mg tablet TAKE 1 TABLET (400 MG TOTAL) BY MOUTH TWO (2) TIMES DAILY.   ? acyclovir 400 mg tablet Take 400 mg by mouth daily.   ? alendronate (FOSAMAX) 70 mg tablet Take 1 tablet (70 mg total) by mouth every seven (7) days With full glass of water on an empty stomach and do not lie down for 30 min..   ? BIOTIN PO Take 1 tablet by mouth daily .     ? calcium carbonate (SUPER CALCIUM) 1500 (600 Ca) mg tablet Take 600 mg by mouth daily.   ? CALCIUM CITRATE PO Take 630 mg by mouth daily.   ??? Cholecalciferol (VITAMIN D3) 5000 units CAPS Take 1 capsule by mouth daily.   ??? Cyanocobalamin (VITAMIN B12) 1000 MCG TBCR Take 1,000 mcg by mouth daily.   ??? cyanocobalamin 500 mcg tablet Take 500 mcg by mouth daily.   ??? Homeopathic Products (TRAUMEEL) OINT Apply 4 g topically daily.   ??? Magnesium 250 MG TABS Take 250 mg by mouth daily.   ??? MAGNESIUM CITRATE PO Take 150 mg by mouth two (2) times daily.   ??? Moringa 500 MG CAPS Take 500 mg by mouth daily.   ??? Omega-3 Fatty Acids (FISH OIL PO) Take 1,400 mg by mouth two (2) times daily.   ??? TAMOXIFEN 20 mg tablet TAKE 1 TABLET BY MOUTH EVERY DAY   ??? tamoxifen 20 mg tablet Take 20 mg by mouth daily.   ??? triamcinolone 0.5% cream Apply topically as needed for. (Patient not taking: Reported on 05/17/2020.)   ??? UNABLE TO FIND Med Name: [place] supplement- Epimedium, Tumeric, Japanese, Fleeceflower, EPA/DHA .   ??? YUVAFEM 10 MCG vaginal tablet PLACE 1 TABLET (10 MCG TOTAL) VAGINALLY TWO (2) TIMES A WEEK.     No current facility-administered medications for this visit.     Allergies:     Allergies   Allergen Reactions   ??? Penicillins Other (See Comments) and Rash     Ampicillin, flesh rash in 1980s   ??? Menadiol Sodium Diphosphate Rash   ??? Other      Red Sula coloring on meds such as advil- not allergic to actual med- just the coloring   ??? Penicillin G      Social History:    reports that she has never smoked. She has never used smokeless tobacco. She reports that she does not drink alcohol and does not use drugs.   Social History     Social History Narrative    Patient is married,  and has two children. She is of Ashkenazi Agricultural engineer.        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 Hypertension Father    ??? Stroke Father    ??? Hyperlipidemia Father    ??? Depression Father    ??? Prostate cancer Father 29   ??? Hypertension Mother    ??? Asthma Mother    ??? Endometrial cancer Mother 3   ??? Hyperthyroidism Mother    ??? Prostate cancer Brother 34   ??? Breast cancer Neg Hx    ??? Ovarian cancer Neg Hx    ??? Malignant hypertension Neg Hx      Review of Systems:   14 system ROS negative except as described above and in the HPI.  Physical Examination:   Vital Signs: LMP  (LMP Unknown)  There is no height or weight on file to calculate BMI.   Wt Readings from Last 3 Encounters:   05/17/20 123 lb (55.8 kg)   05/17/20 123 lb 4.8 oz (55.9 kg)   12/02/19 127 lb 6.4 oz (57.8 kg)      Gen: WD,WN, conversant  Psych: normal affect    Laboratory Results:   I have   [] reviewed radiology,  [] reviewed labs,  [] reviewed & summarized old records, [] requested outside medical records.    Lab Results   Component Value Date    CALCIUM 9.1 07/11/2020    ICALCOR 1.10 05/12/2019    CREAT 0.71 05/17/2020    ALBUMIN 4.5 05/17/2020    CREATRANUR 9 (L) 05/17/2020    PTHINT 48 07/11/2020    VITD25OH 69 (H) 05/17/2020    TSH 1.6 05/17/2020    T4AUTO 1.3 01/22/2017    NTXRANUR 19 05/17/2020    OSTEOCALCIN 15 01/22/2017    ALKPHOS 52 05/17/2020       Lab Results   Component Value Date    WBC 5.37 05/12/2019    HGB 13.5 05/12/2019    HCT 40.2 05/12/2019    MCV 93.7 05/12/2019    PLT 228 05/12/2019       Bone Turnover Markers  Date UNTx BSAP Serum OC                Studies:   DXA 07/11/20 Lenell Antu)  Bone Density:   -----------------------------------------------------------------   Region ??? ??? ??? ??? ??? ??? ??? ??? ??? BMD ??? ???T-score ???Z-score ??? Classification   -----------------------------------------------------------------   AP Spine(L1-L4) ??? ??? ??? ??? ???0.696 ??? -3.2 ??? ??? -1.2 ??? ??? ??? Osteoporosis   Femoral Neck (Left) ??? ??? ???0.580 ??? -2.4 ??? ??? -0.7 ??? ??? ??? Osteopenia   Total Hip (Left) ??? ??? ??? ??? 0.646 ??? -2.4 ??? ??? -1.0 ??? ??? ??? Osteopenia   -----------------------------------------------------------------     World Health Organization criteria for BMD impression   classify patients as: Normal (T-score at or above -1.0),   Osteopenia (T-score between -1.0 and -2.5), or   Osteoporosis (T-score at or below -2.5).     10-year Fracture Risk:   -----------------------------------------------------------------   FRAX not reported because:   ??????Some T-score for Spine Total or Hip Total or Femoral Neck at   or below -2.5   -----------------------------------------------------------------  Previous Exams:   -----------------------------------------------------------------   Region ???Exam ??? ??? Age ???BMD ??? T-score ???BMD Change ??? ??? BMD Change   ?????? ??? ??? ???Date ??? ??? ??? ??? ???g/cm2 ??? ??? ??? ??? ???vs Baseline ??? ???vs Previous   -----------------------------------------------------------------   AP Spine(L1-L4)   ?????? ??? 07/11/2020 ???69 ???0.696 ??? ???-3.2 ???-0.053(-7.1%)* -0.062(-8.1%)*   ?????? ??? 05/13/2018 ???67 ???0.758 ??? ???-2.6 ??? ???0.008(1.1%) ??? ???0.008(1.1%)   ?????? ??? 04/17/2016 ???65 ???0.749 ??? ???-2.7 ??? ??? ??? ??? ??? ??? ??? ??? ??? ??? ??? ??? ??? ??? ???     Total Hip(Left)   ?????? ??? 07/11/2020 ???69 ???0.646 ??? ???-2.4 ???-0.032(-4.8%)* -0.041(-6.0%)*   ?????? ??? 05/13/2018 ???67 ???0.687 ??? ???-2.1 ??? ???0.009(1.3%) ??? ???0.009(1.3%)   ?????? ??? 04/17/2016 ???65 ???0.678 ??? ???-2.2 ??? ??? ??? ??? ??? ??? ??? ??? ??? ??? ??? ??? ??? ??? ???     Femoral Neck(Left)   ?????? ??? 07/11/2020 ???69 ???0.580 ??? ???-2.4 ???-0.018(-2.9%) ???-0.027(-4.5%)   ?????? ??? 05/13/2018 ???67 ???0.607 ??? ???-2.2 ??? ???0.010(1.6%) ??? ???0.010(1.6%)   ?????? ??? 04/17/2016 ???65 ???0.597 ??? ???-2.3 ??? ??? ??? ??? ??? ??? ??? ??? ??? ??? ??? ??? ??? ??? ???   -----------------------------------------------------------------   *Denotes significance at 95% confidence level, LSC for AP Spine   = 0.022 g/cm2, ???LSC for Total Hip = 0.027 g/cm2 ??? ???     Impression: The patient has osteoporosis, based on the Total   Spine T-score. The BMD for the AP Spine(L1-L4) decreased,   changing by -8.1% since the last DXA exam. The BMD for the   Total Hip(Left) decreased, changing by -6.0% since the last DXA   exam.     DXA 05/13/18 Lenell Antu)   Assessment:  1. The left hip T-score of -2.1 meets the criteria for osteopenia and reflects a change of +1.3% from the baseline exam of 04/2016.  The femoral neck T-score of -2.2 is consistent with osteopenia and reflects a change of +1.6%.  2. The right hip and femoral neck T-score's of -2.0 and -2.1 respectively, meet the criteria for osteopenia and represent the right femur baseline.  3. The PA lumbar spine T-score of -2.6 meets the criteria for osteoporosis according to the [facility] classification and reflects a change of +1.1%.    4. The *lateral lumbar spine T-score = n/a  *The lateral lumbar spine view provides a sensitive measure of trabecular bone and is primarily to be used to evaluate response to treatment. A low T-score in the lateral lumbar view alone does NOT fulfill the diagnostic criteria for osteopenia or osteoporosis. (ISCD Practice Recommendations 2006)   Z-scores used in reporting BMD for children/adolescents 5-19, pre-menopausal females and males < 50  ?    DXA 04/17/16 Lenell Antu)  Assessment:  1. The left hip and femoral neck T-score's of -2.2 and -2.3 respectively, meet the criteria for osteopenia.  2. The PA lumbar spine T-score of -2.7 meets the criteria for osteoporosis according to the [facility] classification.    3. The *lateral lumbar spine T-score = n/a    DXA 11/03/11 ([facility])  BASELINE DUAL ENERGY X-RAY ABSORPTIOMETRY (DXA) STUDY  Bone densitometry was performed using a Hologic Discovery A bone densitometer.  ?  PA Spine:       The average bone mineral density from L1-4 is 0.787 g/cm2, 2.4 standard deviations below the mean for normal young adults (*T-scores) or 75% of normal. Compared to age and sex-matched controls (**Z-scores), this density is 0.9 standard deviations   below  the mean.  ?  Total Hip:       The bone mineral density in the left total hip is 0.706 g/cm2, 1.9 standard deviations below the mean for normal young adults (*T-scores) or 75% of normal.  Compared to age and sex-matched controls (**Z-scores), this density is 1.0 standard   deviations below the mean.  ?  Femoral Neck:       The bone mineral density in the left femoral neck is 0.531 g/cm2, 2.9 standard deviations below the mean for normal young adults (*T-scores) or 63% of normal.  Compared to age and sex-matched controls (**Z-scores), this density is 1.6 standard   deviations below the mean.  ?  *T-score compares the BMD of an individual to peak bone mass and expresses the difference as a standard deviation score,**Z-score compares the BMD of an individual with age-matched, gender-matched, and race-matched controls and expresses the difference   as a standard deviation score.  ?  IMPRESSION:  1.  This is a baseline study at the [facility].  2.  The patient has osteoporosis.    Results for orders placed or performed during the hospital encounter of 02/11/17   US thyroid-parathyroid    Narrative    US THYROID PARATHYROID     CLINICAL HISTORY: thyroid nodule.    COMPARISON: None.    TECHNIQUE: Utilizing high resolution ultrasound transducers, real time 2D sonography and limited color Doppler imaging of the neck was performed.    FINDINGS:      The right thyroid lobe measures 4.7 x 1.1 x 1.1 cm and the left thyroid lobe measures 3.5 x 1.0 x 1.3 cm. The isthmus measures 2 mm in AP dimension.      The background thyroid parenchyma is homogeneous. Blood flow in the gland is normal on color Doppler.    No nodules are seen.    No parathyroid nodule is identified.    No morphologically abnormal or enlarged cervical chain lymph nodes.      Impression    IMPRESSION:    Normal ultrasound of the thyroid.      *From the American Thyroid Association 2015 Management Guidelines for Adult Patients with Thyroid Nodules:  High suspicion nodules: Recommend FNA if > 1 cm (Strong recommendation, Moderate-quality evidence);  Intermediate suspicion nodules: Recommend FNA if > 1 cm (Strong recommendation, Low-quality evidence);  Low suspicion nodules: Recommend FNA if > 1.5 cm (Weak recommendation, Low-quality evidence);  Very low suspicion nodules: Consider FNA if > 2 cm versus observation (Weak recommendation, Moderate-quality evidence);  Benign nodules: No biopsy (Strong recommendation, Moderate-quality evidence).    Dictated by: Mohammed Suhail   02/11/2017 12:02 PM    Signed by: Roanna Raider RAGAVENDRA   02/11/2017 12:56 PM             DEXA  Date LS T-score FN T-score TH T-score Rad T-score % Change BMD                Assessment:   Aarti Mankowski is a 70 y.o. female seen in the Endocrine clinic for evaluation and management of osteoporosis.    Plan:   1. Osteoporosis, unspecified osteoporosis type, unspecified pathological fracture presence: diagnosed in 2013 on DXA, about stable on DXA in 2018 but on different machine.  DXA 2020 shows stability in BMD as well. Risk factors for accelerated bone loss: +fam hx, post-menopausal woman, Caucasian, thin. DXA 06/2020 showed worsening BMD in the lumbar spine with T-score -3.2.  - Discussed treatment options  including bisphoshonates (PO, IV), Prolia, Tymlos/Forteo, and Evenity. The patient has decided to take alendronate.  - Start alendronate 70 mg once a week.  Take it first thing in the morning with 8 oz of water.  Do not lie down for 30 minutes after.  If you develop esophageal irritation/pain, let me know.  - bone turnover markers 05/2020 look stable.  - repeat bone turnover markers in 3 months to ensure adequate absorption.  - discussed avoiding twisting under force and lifting items from the floor without bending her knees.  - Encouraged weight bearing exercise  - Goal calcium intake is 1200 mg of elemental calcium per day, ideally more in diet than in supplement.  - Goal vitamin D intake is 800 IU of vitamin D3 per day. Goal vit D 25 OH level is >30.  - Next DXA 06/2022    2. Hyperparathyroidism: Elevated PTH and normal calcium.  Differential diagnoses include secondary hyperparathyroidism (vitamin D deficiency, hypercalciuria, decreased renal function, low calcium intake) vs ''normocalcemic'' primary hyperparathyroidism. 24 hr urine calcium on the low side at 88 in 2018. PTH normalized with increased calcium intake.  - calcium 600 mg daily  - dietary calcium intake discussed  - continue D3 5000 IU daily  - repeat PTH, [place] in 4 months    3. Invasive ductal carcinoma of left breast, ER+PR+HER2-  - on Tamoxifen    4. LDL high: borderline at 100  - no indication for statin  - work on diet      Patient Instructions   1.  Start alendronate 70 mg once a week.  Take it first thing in the morning with 8 oz of water.  Do not lie down for 30 minutes after.  If you develop esophageal irritation/pain, let me know.  2.  Please get your labs at least a week prior to your next appointment at [facility].  3.  Schedule a follow up with me in 4 months on the MyChart app.          Return in about 4 months (around 11/27/2020).     30 minutes spent preparing for this visit, obtaining history, evaluating/examining/counseling the patient, and documenting into the electronic health record.     Author:  Lorenza Chick, MD 07/31/2020 2:02 PM

## 2020-07-27 NOTE — Patient Instructions
1.  Start alendronate 70 mg once a week.  Take it first thing in the morning with 8 oz of water.  Do not lie down for 30 minutes after.  If you develop esophageal irritation/pain, let me know.  2.  Please get your labs at least a week prior to [facility]  3.  Schedule a follow up with me in 4 months on the MyChart app.

## 2020-07-29 ENCOUNTER — Ambulatory Visit: Payer: BC Managed Care – POS

## 2020-07-29 ENCOUNTER — Ambulatory Visit: Payer: BLUE CROSS/BLUE SHIELD

## 2020-09-20 ENCOUNTER — Ambulatory Visit: Payer: BLUE CROSS/BLUE SHIELD

## 2020-09-24 DIAGNOSIS — E673 Hypervitaminosis D: Secondary | ICD-10-CM

## 2020-09-28 DIAGNOSIS — E673 Hypervitaminosis D: Secondary | ICD-10-CM

## 2020-09-28 DIAGNOSIS — M810 Age-related osteoporosis without current pathological fracture: Secondary | ICD-10-CM

## 2020-09-29 NOTE — Patient Instructions
(  1) Add an upper body workout to your walking 14 miles per week and pilates.     (2) Take no Vitamin D for 3 months, then recheck.  Once Vitamin D level is below 50, OK to get on supplement containing Vitamin D.     (3) Tamoxifen supports your bone health.  Consider extending it with your oncologist, as you know, for many people using for 10 years is an option.       (4) Would do a 4th COVID shot now. now.  We are in a big surge, and, unlike the last one, we are seeing people with normal immune systems who did not yet get the 4th shot  hospitalized .    Here's the most up to date opinion, based on our ID specialist's recommendations after looking at the data.  Importantly, although it's true that ''total protection against COVID'' appears to last only a few weeks after the 4th shot, protection against hospitalization and death lasts much longer:    On March 29th, the Food and Drug Administration authorized a fourth Pfizer or Moderna dose for people age 25 and older.The Centers for Disease Control and Prevention quickly backed the decision, allowing the shots to be administered.    I recommend you get the shot at your local pharmacy.  The [facility] on the 1st floor of the [address] building has these shots as well.  We will be able to order through [facility] soon, but the wait will be long (a month or more).     Some experts recommend ''mixing'' the shots,   but it's not clear this is better, you can do either [employer] or Moderna.     You should get the shot at least 4 months after your last booster.     According to the head of the FDA, ''This fourth booster dose is something that evidence that we have now from [place] suggests that by getting this, one can reduce the risk of hospitalization and death in this population of older individuals''

## 2020-10-01 ENCOUNTER — Telehealth: Payer: BLUE CROSS/BLUE SHIELD

## 2020-10-01 NOTE — Progress Notes
PATIENT: Robin Spencer  MRN: [id number]  DOB: 1950-12-06  DATE OF SERVICE: 10/01/2020    CHIEF COMPLAINT:   Chief Complaint   Patient presents with   ? Osteoporosis        Patient Consent to Telehealth Questionnaire   [facility] TELEHEALTH PRECHECKIN QUESTIONS 09/30/2020   By clicking ''I Agree'', I consent to the below:  I Agree     - I agree  to be treated via a video visit and acknowledge that I may be liable for any relevant copays or coinsurance depending on my insurance plan.  - I understand that this video visit is offered for my convenience and I am able to cancel and reschedule for an in-person appointment if I desire.  - I also acknowledge that sensitive medical information may be discussed during this video visit appointment and that it is my responsibility to locate myself in a location that ensures privacy to my own level of comfort.  - I also acknowledge that I should not be participating in a video visit in a way that could cause danger to myself or to those around me (such as driving or walking).  If my provider is concerned about my safety, I understand that they have the right to terminate the visit.    HPI   Robin Spencer is a 70 y.o. female presents for   Chief Complaint   Patient presents with   ? Osteoporosis       The patient saw Dr. Selena Batten, does not want to take alendronate as states had 2 relatives with side effects (teeth fell out, jaw judged weak by dentist).  Doing pilates, hiking for exercise, planning to start supplement ''Algaecal'' (red algae plus Vitamin D).        Hypervitaminosis D discussed, recommended avoiding all Vitamin D for 3 months and recheck.     Completing TAM, discussed effect on bone health.     MEDS     Medications that the patient states to be currently taking   Medication Sig   ? ACYCLOVIR 400 mg tablet TAKE 1 TABLET (400 MG TOTAL) BY MOUTH TWO (2) TIMES DAILY.   ? acyclovir 400 mg tablet Take 400 mg by mouth daily.   ? BIOTIN PO Take 1 tablet by mouth daily .     ? calcium carbonate 1500 (600 Ca) mg tablet Take 600 mg by mouth daily.   ? CALCIUM CITRATE PO Take 630 mg by mouth daily.   ? Cyanocobalamin (VITAMIN B12) 1000 MCG TBCR Take 1,000 mcg by mouth daily.   ? cyanocobalamin 500 mcg tablet Take 500 mcg by mouth daily.   ? Homeopathic Products (TRAUMEEL) OINT Apply 4 g topically daily.   ? Magnesium 250 MG TABS Take 250 mg by mouth daily.   ? MAGNESIUM CITRATE PO Take 150 mg by mouth two (2) times daily.   ? Moringa 500 MG CAPS Take 500 mg by mouth daily.   ? tamoxifen 20 mg tablet Take 20 mg by mouth daily.   ? triamcinolone 0.5% cream Apply topically as needed for.   ? UNABLE TO FIND Med Name: Promedley supplement- Epimedium, Tumeric, Japanese, Fleeceflower, EPA/DHA .   ? YUVAFEM 10 MCG vaginal tablet PLACE 1 TABLET (10 MCG TOTAL) VAGINALLY TWO (2) TIMES A WEEK.       Patient Active Problem List   Diagnosis   ? Osteoporosis   ? Eye allergies   ? Genital HSV   ? PPD positive   ?  Palpitations   ? OA (osteoarthritis)   ? Visit for preventive health examination   ? Hearing loss, right   ? Seborrheic dermatitis   ? Menopause   ? BPPV (benign paroxysmal positional vertigo)   ? Fibroid   ? Cataract, nuclear sclerotic, both eyes   ? Status post LASIK surgery of both eyes   ? Breast pain   ? Invasive ductal carcinoma of left breast, ER+PR+HER2-   ? Immune to measles   ? Syncope and collapse   ? Increased PTH level   ? Vitreous floater, bilateral   ? Asymptomatic varicose veins   ? Rash, Intermittent, noted after colds   ? Fibromyalgia   ? Mucous cyst of digit of left hand   ? Other fracture of right great toe, initial encounter for closed fracture   ? OSA (obstructive sleep apnea)   ? Dyspepsia   ? Schatzki's ring   ? Vaginal atrophy   ? Esophageal motility disorder   ? History of COVID-19   ? Hypervitaminosis D       PHYSICAL EXAM    There were no vitals filed for this visit.  There is no height or weight on file to calculate BMI.    System Check if normal Positive or additional negative findings   Constit  []  General appearance     Eyes  []  Conj/Lids []  Pupils  []  Fundi     HENMT  []  External ears/nose []  Otoscopy   []  Gross Hearing []  Nasal mucosa   []  Lips/teeth/gums []  Oropharynx    []  mucus membranes []  Head     Neck  []  Inspection/palpation []  Thyroid     Resp  []  Effort []  Wheezing    []  Auscultation  []  Crackles     CV  []  Rhythm/rate   []  Murmurs   []  LEE   []  JVP non-elevated    Normal pulses:   []  Radial []  Femoral  []  Pedal     Breast  []  Inspection []  Palpation     GI  []  abd masses    []  tenderness   []  rebound/guarding   []  Liver/spleen []  Rectal     GU  M: []  Scrotum []  Penis []  Prostate   F:  []  External []  vaginal wall        []  Cervix  []  mucus        []  Uterus    []  Adnexa      Lymph  []  Neck []  Axillae []  Groin     MSK Specify site examined:    []  Inspect/palp []  ROM   []  Stability []  Strength/tone         Skin  []  Inspection []  Palpation     Neuro  []  CN2-12 intact grossly   []  Alert and oriented   []  DTR      []  Muscle strength      []  Sensation   []  Gait/balance     Psych  []  Insight/judgement     []  Mood/affect    []  Gross cognition        LABS/STUDIES   I have:   [x]  Reviewed/ordered []  1 [x]  2 []  ? 3 unique laboratory, radiology, and/or diagnostic tests noted below - Vitamin D, DEXA results discussed with the patient.    []  Reviewed []  1 []  2 []  ? 3 prior external notes and incorporated into patient assessment    []  Discussed management or test interpretation with external provider(s) as noted  Lab Studies:    Lab Results   Component Value Date    WBC 5.37 05/12/2019    HGB 13.5 05/12/2019    HCT 40.2 05/12/2019    MCV 93.7 05/12/2019    PLT 228 05/12/2019     Lab Results   Component Value Date    CREAT 0.71 05/17/2020    BUN 15 05/17/2020    NA 144 05/17/2020    K 4.0 05/17/2020    CL 105 05/17/2020    CO2 27 05/17/2020     Lab Results   Component Value Date    ALT 16 05/17/2020    AST 21 05/17/2020    ALKPHOS 52 05/17/2020    BILITOT 0.5 05/17/2020     Lab Results Component Value Date    CHOL 188 05/17/2020    CHOLDLCAL 144 (H) 04/20/2014    TRIGLY 99 04/20/2014     Lab Results   Component Value Date    TSH 1.6 05/17/2020     05/17/20 Vitamin D = 69        Imaging Studies:     =================================================================   ?? ? ? ? ? ? ? ? ? ? ? Bone Density Report ? ? ? ? ? ? ? ? ? ? ?   =================================================================     Name: ? ? ? ? ?Robin Spencer, Robin Spencer   Patient ID: ? ?[id number]   Age: ? ? ? ? ? 4   Sex: ? ? ? ? ? Female   Ethnicity: ? ? White   Date of Birth: 31-Jan-1951     -----------------------------------------------------------------     Indication: Postmenopausal osteoporosis;     Referring Provider: Modesta Sammons P.     Study: Bone densitometry was performed.     Exam Date: July 11, 2020     Accession number: [id number]       Bone Density:   -----------------------------------------------------------------   Region ? ? ? ? ? ? ? ? ? BMD ? ?T-score ?Z-score ? Classification   -----------------------------------------------------------------   AP Spine(L1-L4) ? ? ? ? ?0.696 ? -3.2 ? ? -1.2 ? ? ? Osteoporosis   Femoral Neck (Left) ? ? ?0.580 ? -2.4 ? ? -0.7 ? ? ? Osteopenia   Total Hip (Left) ? ? ? ? 0.646 ? -2.4 ? ? -1.0 ? ? ? Osteopenia   -----------------------------------------------------------------     World Health Organization criteria for BMD impression   classify patients as:   Normal (T-score at or above -1.0),   Osteopenia (T-score between -1.0 and -2.5), or   Osteoporosis (T-score at or below -2.5).     10-year Fracture Risk:   -----------------------------------------------------------------   FRAX not reported because:   ??Some T-score for Spine Total or Hip Total or Femoral Neck at   or below -2.5         A&P   Robin Spencer is a a 70 y.o. female presenting for   Chief Complaint   Patient presents with   ? Osteoporosis         PROBLEM & ORDERS    ICD-10-CM    1. Age-related osteoporosis without current pathological fracture  M81.0    2. Hypervitaminosis D  E67.3        ASSESSMENT    The patient saw Dr. Selena Batten, does not want to take alendronate as states had 2 relatives with side effects (teeth fell out, jaw judged weak by dentist).  Doing pilates, hiking for exercise, planning to start supplement ''Algaecal'' (  red algae plus Vitamin D).  Doe snot want other rx.       Hypervitaminosis D discussed, recommended avoiding all Vitamin D for 3 months and recheck.     Completing TAM, discussed effect on bone health, she will consider extending to 10 year rx with oncologist.      Declines 4th COVID shot, gave info below if reconsiders.   .           Patient Instructions   (1) Add an upper body workout to your walking 14 miles per week and pilates.     (2) Take no Vitamin D for 3 months, then recheck.  Once Vitamin D level is below 50, OK to get on supplement containing Vitamin D.     (3) Tamoxifen supports your bone health.  Consider extending it with your oncologist, as you know, for many people using for 10 years is an option.       (4) Would do a 4th COVID shot now. now.  We are in a big surge, and, unlike the last one, we are seeing people with normal immune systems who did not yet get the 4th shot  hospitalized .    Here's the most up to date opinion, based on our ID specialist's recommendations after looking at the data.  Importantly, although it's true that ''total protection against COVID'' appears to last only a few weeks after the 4th shot, protection against hospitalization and death lasts much longer:    On March 29th, the Food and Drug Administration authorized a fourth Pfizer or Moderna dose for people age 28 and older.The Centers for Disease Control and Prevention quickly backed the decision, allowing the shots to be administered.    ? I recommend you get the shot at your local pharmacy.  The [facility] on the 1st floor of the [address] building has these shots as well.  We will be able to order through [facility] soon, but the wait will be long (a month or more).     ? Some experts recommend ''mixing'' the shots,   but it's not clear this is better, you can do either [employer] or Moderna.     ? You should get the shot at least 4 months after your last booster.     According to the head of the FDA, ''This fourth booster dose is something that evidence that we have now from [place] suggests that by getting this, one can reduce the risk of hospitalization and death in this population of older individuals''      The above recommendation were discussed with the patient.  The patient has all questions answered satisfactorily and is in agreement with this recommended plan of care.    Return in about 8 months (around 06/01/2021) for Annual Wellness- 30 minutes.     Author:  Weyman Croon. Maicey Barrientez 10/01/2020 8:29 AM     20 minutes were spent personally by me today on this encounter which include today's pre-visit review of the chart, obtaining appropriate history, performing an evaluation, documentation and discussion of management with details supported within the note for today's visit. The time documented was exclusive of any time spent on the separately billed procedure.

## 2020-10-06 MED ORDER — ACYCLOVIR 400 MG PO TABS
ORAL_TABLET | 1 refills | Status: AC
Start: 2020-10-06 — End: ?

## 2020-10-24 ENCOUNTER — Non-Acute Institutional Stay: Payer: BLUE CROSS/BLUE SHIELD

## 2020-11-21 ENCOUNTER — Ambulatory Visit: Payer: BLUE CROSS/BLUE SHIELD

## 2020-11-26 ENCOUNTER — Ambulatory Visit: Payer: BC Managed Care – POS

## 2020-11-27 ENCOUNTER — Ambulatory Visit: Payer: BC Managed Care – POS

## 2020-12-02 ENCOUNTER — Ambulatory Visit: Payer: BLUE CROSS/BLUE SHIELD

## 2020-12-02 ENCOUNTER — Ambulatory Visit: Payer: BC Managed Care – POS

## 2020-12-03 ENCOUNTER — Non-Acute Institutional Stay: Payer: BLUE CROSS/BLUE SHIELD

## 2020-12-03 ENCOUNTER — Ambulatory Visit: Payer: BLUE CROSS/BLUE SHIELD

## 2020-12-03 MED ORDER — TAMOXIFEN CITRATE 20 MG PO TABS
20 mg | ORAL_TABLET | Freq: Every day | ORAL | 3 refills | 60.00000 days | Status: AC
Start: 2020-12-03 — End: ?

## 2020-12-03 NOTE — Telephone Encounter
Dr. Lysbeth Penner refilled it today.  Please disregard, thank you.

## 2020-12-05 ENCOUNTER — Non-Acute Institutional Stay: Payer: BLUE CROSS/BLUE SHIELD | Attending: "Endocrinology

## 2020-12-10 ENCOUNTER — Ambulatory Visit: Payer: BLUE CROSS/BLUE SHIELD

## 2020-12-17 MED ORDER — YUVAFEM 10 MCG VA TABS
ORAL_TABLET | 1 refills
Start: 2020-12-17 — End: ?

## 2020-12-18 MED ORDER — YUVAFEM 10 MCG VA TABS
ORAL_TABLET | 1 refills | Status: AC
Start: 2020-12-18 — End: ?

## 2020-12-19 ENCOUNTER — Non-Acute Institutional Stay: Payer: BLUE CROSS/BLUE SHIELD | Attending: Radiation Oncology

## 2020-12-26 ENCOUNTER — Telehealth: Payer: BC Managed Care – POS

## 2020-12-26 ENCOUNTER — Ambulatory Visit: Payer: BLUE CROSS/BLUE SHIELD

## 2020-12-28 ENCOUNTER — Ambulatory Visit: Payer: BC Managed Care – POS

## 2021-01-05 ENCOUNTER — Ambulatory Visit: Payer: BLUE CROSS/BLUE SHIELD

## 2021-01-07 ENCOUNTER — Non-Acute Institutional Stay: Payer: BC Managed Care – POS

## 2021-01-07 ENCOUNTER — Telehealth: Payer: BC Managed Care – POS

## 2021-01-07 DIAGNOSIS — Z23 Encounter for immunization: Secondary | ICD-10-CM

## 2021-01-07 NOTE — Telephone Encounter
Appointment Accommodation Request      Appointment Type:  Return  Reason for sooner request:  Patient requesting appt for skin check because she will be in town only during 12/5-12/7  Date/Time Requested (If any):   Any day between 12/5 & 12/7  Last seen by MD:    06/01/2019  Any Symptoms:  []  Yes  [x]  No       If yes, what symptoms are you experiencing:   o Duration of symptoms (how long):     Patient or caller was offered an appointment but declined.  Yes   Patient or caller was advised to seek emergency services if conditions are urgent or emergent.    Patient or caller has been notified of the turnaround time of 1-2 business (days).

## 2021-01-09 ENCOUNTER — Ambulatory Visit: Payer: BLUE CROSS/BLUE SHIELD

## 2021-01-09 NOTE — Telephone Encounter
Patient scheduled during her requested dates with Dr Melanee Spry.

## 2021-01-12 ENCOUNTER — Ambulatory Visit: Payer: BC Managed Care – POS

## 2021-01-20 ENCOUNTER — Ambulatory Visit: Payer: BC Managed Care – POS

## 2021-02-05 ENCOUNTER — Non-Acute Institutional Stay: Payer: BC Managed Care – POS | Attending: Radiation Oncology

## 2021-02-13 ENCOUNTER — Ambulatory Visit: Payer: BC Managed Care – POS

## 2021-02-13 ENCOUNTER — Telehealth: Payer: BLUE CROSS/BLUE SHIELD

## 2021-02-13 NOTE — Telephone Encounter
Message to Practice/Provider      Message: Patient declined to reschedule at the moment.    Return call is not being requested by the patient or caller.    Patient or caller has been notified of the turnaround time of 1-2 business day(s).

## 2021-02-13 NOTE — Telephone Encounter
ldm and sent mychart message. appt cx due to dr leaving on maternity, please reschedule with any other provider in the community or in [place] if they are willing to wait. Volente okay to asisst-CP

## 2021-02-14 ENCOUNTER — Ambulatory Visit: Payer: BC Managed Care – POS

## 2021-02-14 DIAGNOSIS — H9191 Unspecified hearing loss, right ear: Secondary | ICD-10-CM

## 2021-02-14 DIAGNOSIS — M810 Age-related osteoporosis without current pathological fracture: Secondary | ICD-10-CM

## 2021-02-14 DIAGNOSIS — Z1231 Encounter for screening mammogram for malignant neoplasm of breast: Secondary | ICD-10-CM

## 2021-02-14 DIAGNOSIS — Z23 Encounter for immunization: Secondary | ICD-10-CM

## 2021-02-15 ENCOUNTER — Ambulatory Visit: Payer: BC Managed Care – POS

## 2021-02-15 NOTE — Patient Instructions
Please send a copy of your Durable Power of Attorney/Advanced Heathcare Planning Form to our office so we can put it in our computer system.  This will make it available to all your physicians, the Emergency Department, and our hospitals, if needed.      Consider the new ''bivalent'' COVID booster shot which became available in September 2023.  This can prevent needing care in a hospital or death from [place]. You can take it at the same time as the flu shot, or by itself.     [place] will have a limited supply of COVID vaccine, I suggest you get it at a local pharmacy.     If you have recently had COVID, wait 3 months for the booster.

## 2021-02-15 NOTE — Telephone Encounter
Patient has been notified to be fasting 9-12 hours to do the blood work .this encounter will be closed

## 2021-02-18 ENCOUNTER — Non-Acute Institutional Stay: Payer: BC Managed Care – POS | Attending: Student in an Organized Health Care Education/Training Program

## 2021-02-18 NOTE — Progress Notes
BREAST FOLLOW-UP OFFICE VISIT NOTE    ?PATIENT: Robin Spencer  ?MRN: [id number]  ?DOB: 11-Mar-1951    ?DATE OF SERVICE: 02/19/2021    ?REFERRING PRACTITIONER: Cyril Loosen., MD  ?PRIMARY CARE PROVIDER: Pregler, Weyman Croon., MD  ?RESIDENT PHYSICIAN: Kennedy Bucker, MD  ?ATTENDING PHYSICIAN: Kennedy Bucker, MD     ?CHIEF COMPLAINT: almost 5 yr f/u    ?IDENTIFYING DATA: Robin Spencer is a 70 y.o. female with a pT2N0(i-), Grade 1, ER+, PR-, Her2- IDC of the left breast s/p BCS+SNB on 02/29/16. She completed RT 04/24/16, on tamoxifen.    ?RADIATION HISTORY: She received 42.56 Gy in 16 fractions to the breast followed by a boost of 10 Gy in 4 fractions to the lumpectomy cavity. Radiation was completed on 04/24/2016.     ?DATA OF INTERVAL HISTORY:  ? Breast-FU Data Fields ~ Data ~ Comments    ? Most recent imaging date (m/d/yyyy) ~ 02/18/21 bilat mm and [place]  12/30/17 breast MRI    ~     ? Local Failure (LF) ~  No Local Failure ~     ? If LF, date (m/d/yyyy) ~  ~     ? Regional failure (RF) ~  No Regional Failure ~     ? If RF, date (m/d/yyyy) ~  ~     ? Distant Mets (DM) ~  No Distant Metastases ~     ? If DM, date (m/d/yyyy) ~  ~     ? Any failure within RT field? ~  No ~     ? RT related complications ~  No ~     ? Complications ~  ~     ? Highest complication grade  (CTCAE) ~  ---- ~  ?     Subjective:       Interval History:   Robin Spencer returns for routine posttreatment follow up since completing radiation approximately 5 yrs ago.    Today, she is feeling well. Regarding the treated left breast, she reports no changes since last f/u and denies any new symptoms or concerns.  She is happy w cosmesis but reports that the left nipple areola complex continues to look paler on the left and the treated left  breast remains slightly fuller compared to the right breast. The left NAC is less sensitive over time. She reports painful areas in the left breast during recent US though otherwise denies breast pain. She reports bilateral breast tenderness, L>R. She does endorse left axillary TTP. No skin textural change. She denies cough, shortness of breath, swelling in the arm, headaches, bone pain. She moved to [facility] and continues to be very happy there.  She recently was diagnosed with sleep apnea for which she uses a CPAP and loves it as she is feeling much better since starting to use it.    On tamoxifen and following with Dr Maye Hides, has f/u 02/27/21. She plans to continue it beyond 5 yr as she likes that it helps w bone health. She reports getting 1 minute long hot flashes every hour.    12/19/20 bilat mm and [place]: report not available at time of visit.    MRI breast 12/30/17:  FINDINGS:   ?  GENERAL:  -Breast density: Extreme fibroglandular tissue  -Background parenchymal enhancement: Minimal  ?  RIGHT BREAST:  -Small enhancing oval mass with circumscribed margins in the right lateral breast (post-contrast series, image 121) is stable in comparison to 02/05/2016 and benign  in appearance  -No suspicious masses, lymph nodes, or areas of abnormal enhancement  ?  LEFT BREAST:  -Postsurgical changes at 12:00 without abnormal enhancement  -No suspicious masses, lymph nodes, or areas of abnormal enhancement  ?  ADDITIONAL:  -None.  ?  ?  IMPRESSION:   ?  RIGHT BREAST: CATEGORY 1 - NEGATIVE.    ?  LEFT BREAST: No abnormal enhancement in the region of the post-surgical scar. CATEGORY 2 - BENIGN FINDINGS.  ?  OVERALL ASSESSMENT: CATEGORY 2 - BENIGN FINDINGS. Recommend routine annual mammography.                PERTINENT PAST MEDICAL HISTORY:   Past Medical History:   Diagnosis Date   ? Anxiety    ? Breast cancer (HCC/RAF)    ? Cataract    ? Eczema    ? Fibroids    ? History of migraine headaches    ? History of radiation therapy 04/2016    L breast   ? OSA (obstructive sleep apnea) 11/07/2019   ? Otosclerosis    ? Post-operative nausea and vomiting    ? Rectal fissure    ? Vitreous detachment      Past Surgical History:   Procedure Laterality Date   ? BREAST BIOPSY  1986    Fibroadenoma, ductal hyperplasia   ? Laparoscopy for subfertility     ? Left finger cyst removal x 2     ? Stapedectomy, right         GYN HISTORY:   OB History   Obstetric Comments   Menses started: 32   Gravida: 0   Para:  0   Age of first live birth: n/a    Breast feed: n/a    Contraceptives/Type:  Y, 18-30    Menopause reached at: 46   Hormone replacement/Type: Y, 16-64    Fertility Tx: Y        ALLERGIES:   Allergies as of 02/19/2021 - Review Complete 07/12/2020   Allergen Reaction Noted   ? Penicillins Other (See Comments) and Rash 01/08/2012   ? Menadiol sodium diphosphate Rash 03/19/2016   ? Other  02/29/2016   ? Penicillin g  10/05/2019     MEDICATIONS:   Current Outpatient Medications   Medication Sig   ? acyclovir 400 mg tablet Take 400 mg by mouth daily.   ? BIOTIN PO Take 1 tablet by mouth daily .     ? calcium carbonate 1500 (600 Ca) mg tablet Take 600 mg by mouth daily.   ? cyanocobalamin 500 mcg tablet Take 500 mcg by mouth daily.   ? Homeopathic Products (TRAUMEEL) OINT Apply 4 g topically daily.   ? Magnesium 250 MG TABS Take 250 mg by mouth daily.   ? MAGNESIUM CITRATE PO Take 150 mg by mouth two (2) times daily.   ? Omega-3 Fatty Acids (FISH OIL PO) Take 1,400 mg by mouth two (2) times daily.   ? tamoxifen 20 mg tablet Take 1 tablet (20 mg total) by mouth daily.   ? triamcinolone 0.5% cream Apply topically as needed for.   ? UNABLE TO FIND Med Name: Promedley supplement- Epimedium, Tumeric, Japanese, Fleeceflower, EPA/DHA .   ? YUVAFEM 10 MCG vaginal tablet PLACE 1 TABLET VAGINALLY TWO TIMES A WEEK.     No current facility-administered medications for this visit.       SOCIAL HX:   Social History     Socioeconomic History   ? Marital status:  Married   Tobacco Use   ? Smoking status: Never   ? Smokeless tobacco: Never   Substance and Sexual Activity   ? Alcohol use: No     Alcohol/week: 0.0 oz   ? Drug use: No   ? Sexual activity: Yes     Partners: Male Birth control/protection: None   Social History Narrative    Patient is married, and has two children. She is of Ashkenazi Agricultural engineer.        Work: Retired from [employer] relations        Exercise:        Diet:        Transfusion:        Religion:       FAMILY HX:    Family History   Problem Relation Age of Onset   ? Hypertension Father    ? Stroke Father    ? Hyperlipidemia Father    ? Depression Father    ? Prostate cancer Father 51   ? Hypertension Mother    ? Asthma Mother    ? Endometrial cancer Mother 76   ? Hyperthyroidism Mother    ? Prostate cancer Brother 61   ? Breast cancer Neg Hx    ? Ovarian cancer Neg Hx    ? Malignant hypertension Neg Hx       Objective:      Physical Exam: VIDEO VISIT  There were no vitals taken for this visit.   GENERAL: The patient is a well-developed, well-nourished, female in no acute distress.  HEENT: Normocephalic and atraumatic. Anicteric sclerae. Mucous membranes are moist. Hearing is intact bilaterally.   NECK: nl ROM  BREASTS: deferred, video visit  LYMPHATICS: deferred  CHEST: Non labored breathing.   MUSCULOSKELETAL SYSTEM: No upper extremity lymphedema. Shoulder abduction and external rotation fully intact.   NEUROLOGIC EXAM: Patient is alert and oriented. Patient ambulates with a normal gait.    Lab Review / Pathology / Radiology:     See HPI     Assessment:      Robin Spencer is a 70 y.o. female with a pT2N0(i-), Grade 1, ER+, PR-, Her2- IDC of the left breast s/p BCS+SNB on 02/29/16 followed by radiation, on tamoxifen. NED     Plan/ Recommendation:      - On tamoxifen, following w Dr. Maye Hides, seeing her 02/27/21   - Bilateral mammogram and ultrasound done 02/18/21 and results pending; supplemental U/S due to extremely dense breast tissue and mammo occult cancer  - Will provide patient mmg and [place] 02/18/21 report when available.  - I explained that I would be happy to follow up with her at any time in the future in person, by phone, or by email should she have any questions or concerns. Can f/u as desired now 5 yrs post RT.    Thank you kindly for allowing me to participate in the care of this lovely patient.  Low level of Medical Decision Making: (at least 2 of below)  [x]  Low complexity of problems addressed. Cancer of overlapping sites of left female beast. This is a stable chronic illness.  [x]  Limited amount/complexity of data personally reviewed and analyzed by me (at least 1 of below)   ? [x]  Category 1 (tests, documents): review of prior external notes [providers/dates: Dr Maye Hides, review of test results (mammogram [dates: 02/18/21], ultrasound [dates: 02/18/21])  ? []  Category 2: assessment requiring an independent historian  []  Low (but more than minimal) risk  of complications/morbidity of additional testing or treatment        Total time: I spent 25 minutes on the day of service, which included:  [x]  Face-to-face and non-face-to-face time spent with the patient  [x]  Preparing to see the patient (e.g. review of tests, imaging)  [x]  Obtaining and/or reviewing separately obtained history  [x]  Performing a medically appropriate exam and evaluation  [x]  Counseling and education with the patient and family/caregiver  []  Ordering medications, tests, procedures  []  Referring or communicating with other healthcare professionals  [x]  Documenting clinical information in the EHR  []  Independently interpreting results and communicating results to patient/family/caregiver         cc Pregler, Weyman Croon., MD  Cyril Loosen., MD    Author: Kennedy Bucker 02/17/2021 4:15 PM

## 2021-02-19 ENCOUNTER — Inpatient Hospital Stay: Payer: BLUE CROSS/BLUE SHIELD | Attending: Radiation Oncology

## 2021-02-19 DIAGNOSIS — C50812 Malignant neoplasm of overlapping sites of left female breast: Secondary | ICD-10-CM

## 2021-02-19 DIAGNOSIS — Z170 Estrogen receptor positive status [ER+]: Secondary | ICD-10-CM

## 2021-02-20 ENCOUNTER — Non-Acute Institutional Stay: Payer: BC Managed Care – POS

## 2021-02-20 ENCOUNTER — Ambulatory Visit: Payer: BC Managed Care – POS

## 2021-02-20 DIAGNOSIS — D1801 Hemangioma of skin and subcutaneous tissue: Secondary | ICD-10-CM

## 2021-02-20 DIAGNOSIS — L821 Other seborrheic keratosis: Secondary | ICD-10-CM

## 2021-02-20 DIAGNOSIS — L720 Epidermal cyst: Secondary | ICD-10-CM

## 2021-02-20 DIAGNOSIS — L814 Other melanin hyperpigmentation: Secondary | ICD-10-CM

## 2021-02-20 DIAGNOSIS — D225 Melanocytic nevi of trunk: Secondary | ICD-10-CM

## 2021-02-20 DIAGNOSIS — L0591 Pilonidal cyst without abscess: Secondary | ICD-10-CM

## 2021-02-20 DIAGNOSIS — L918 Other hypertrophic disorders of the skin: Secondary | ICD-10-CM

## 2021-02-20 NOTE — Progress Notes
PATIENT: Robin Spencer  MRN: [id number]  DOB: December 22, 1950  DATE OF SERVICE: 02/20/2021    REFERRING PRACTITIONER: Tye Savoy, MD  PRIMARY CARE PROVIDER: Pregler, Weyman Croon., MD  CHIEF COMPLAINT:   Chief Complaint   Patient presents with   ? General Skin Check     Under breast, back, lower back, and face       Subjective:      Robin Spencer is a 70 y.o. year old female with no h/o skin cancer here for FBSE. LV 05/2019. Patient denies any new or changing lesions.      Past Medical History:   Diagnosis Date   ? Anxiety    ? Breast cancer (HCC/RAF)    ? Cataract    ? Eczema    ? Fibroids    ? History of migraine headaches    ? History of radiation therapy 04/2016    L breast   ? OSA (obstructive sleep apnea) 11/07/2019   ? Otosclerosis    ? Post-operative nausea and vomiting    ? Rectal fissure    ? Vitreous detachment      Past Surgical History:   Procedure Laterality Date   ? BREAST BIOPSY  1986    Fibroadenoma, ductal hyperplasia   ? Laparoscopy for subfertility     ? Left finger cyst removal x 2     ? Stapedectomy, right       Family History   Problem Relation Age of Onset   ? Hypertension Father    ? Stroke Father    ? Hyperlipidemia Father    ? Depression Father    ? Prostate cancer Father 11   ? Hypertension Mother    ? Asthma Mother    ? Endometrial cancer Mother 65   ? Hyperthyroidism Mother    ? Prostate cancer Brother 5   ? Breast cancer Neg Hx    ? Ovarian cancer Neg Hx    ? Malignant hypertension Neg Hx      Current Outpatient Medications   Medication Sig   ? acyclovir 400 mg tablet Take 400 mg by mouth daily.   ? BIOTIN PO Take 1 tablet by mouth daily .     ? calcium carbonate 1500 (600 Ca) mg tablet Take 600 mg by mouth daily.   ? cyanocobalamin 500 mcg tablet Take 500 mcg by mouth daily.   ? Homeopathic Products (TRAUMEEL) OINT Apply 4 g topically daily.   ? Magnesium 250 MG TABS Take 250 mg by mouth daily.   ? MAGNESIUM CITRATE PO Take 150 mg by mouth two (2) times daily.   ? Omega-3 Fatty Acids (FISH OIL PO) Take 1,400 mg by mouth two (2) times daily.   ? tamoxifen 20 mg tablet Take 1 tablet (20 mg total) by mouth daily.   ? triamcinolone 0.5% cream Apply topically as needed for.   ? UNABLE TO FIND Med Name: [place] supplement- Epimedium, Tumeric, Japanese, Fleeceflower, EPA/DHA .   ? YUVAFEM 10 MCG vaginal tablet PLACE 1 TABLET VAGINALLY TWO TIMES A WEEK.   ? [DISCONTINUED] ACYCLOVIR 400 mg tablet TAKE 1 TABLET BY MOUTH TWO TIMES DAILY.   ? [DISCONTINUED] CALCIUM CITRATE PO Take 630 mg by mouth daily.   ? [DISCONTINUED] Cholecalciferol (VITAMIN D3) 5000 units CAPS Take 1 capsule by mouth daily.   ? [DISCONTINUED] Cyanocobalamin (VITAMIN B12) 1000 MCG TBCR Take 1,000 mcg by mouth daily.   ? [DISCONTINUED] Moringa 500 MG CAPS Take 500 mg by  mouth daily.     No current facility-administered medications for this visit.     Allergies   Allergen Reactions   ? Penicillins Other (See Comments) and Rash     Ampicillin, flesh rash in 1980s   ? Menadiol Sodium Diphosphate Rash   ? Other      [place] coloring on meds such as advil- not allergic to actual med- just the coloring   ? Penicillin G        Review of Systems:  Constit: feels well, no complaints  ENMT: not assessed  Endo: not assessed  MS: not assessed  Skin: otherwise negative       Objective:      Gen: pleasant, NAD, appears as stated age  Eyes (lids/conjunctiva): examined  Head: examined  Scalp: examined  Lips: examined  Neck: examined  Chest/breasts/axillae: examined  Back: examined  Abdomen: examined  Buttocks: examined  L upper extremity: examined  R upper extremity: examined  L lower extremity: examined  R lower extremity: examined  Digits/nails: examined  All areas examined were within normal limits with the following exceptions:  - L cheek with two 1mm white cystic papules  - pedunculated papules on axilla and under the breasts  - Gluteal cleft with indented opening  - Multiple tan to [place] stuck-on waxy papules and plaques on face, trunk, extremities  - Multiple Tsutsui even macules with moth-eaten border on sun-exposed areas  - Bright red papules scattered on trunk and extremities  - Multiple pink or Mcinnis macules and papules scattered on face, trunk, extremities. Symmetric, uniform in color, regular borders         Assessment/Plan:     1. Milia - L cheek  2. Skin tags  3. Favor Pilonidal cyst - gluteal cleft  - reassurance    4. SKs  5. Lentigines  6. Cherry angiomas  - Reassurance    7. Benign-appearing nevi  - cont to monitor  - discussed ABCDEs  A: Asymmetry; one half does not look like the other half  B: Border; irregular, scalloped or poorly defined border  C: Color; varied from one area to another; shades of tan, [place], black and sometimes white, red or blue  D: Diameter; while melanomas are usually greater than 6mm (the size of a pencil eraser) when diagnosed, they can be smaller  E: Evolving; a mole or skin lesion that looks different from the rest or is changing in size, shape or color  - sun protection emphasized      RTC 12 months for general skin exam or sooner if new or changing lesions    Author:  Tye Savoy 02/20/2021 12:26 PM

## 2021-02-25 ENCOUNTER — Ambulatory Visit: Payer: BC Managed Care – POS

## 2021-02-27 ENCOUNTER — Telehealth: Payer: BC Managed Care – POS

## 2021-02-27 DIAGNOSIS — Z9889 Other specified postprocedural states: Secondary | ICD-10-CM

## 2021-02-27 DIAGNOSIS — Z923 Personal history of irradiation: Secondary | ICD-10-CM

## 2021-02-27 DIAGNOSIS — Z79810 Long term (current) use of selective estrogen receptor modulators (SERMs): Secondary | ICD-10-CM

## 2021-02-27 NOTE — Progress Notes
BREAST MEDICAL ONCOLOGY PROGRESS NOTE     Patient name: Robin Spencer  PCP: Pregler, Weyman Croon., MD  Referring provider: No ref. provider found      MRN: [id number]  DOB: 22-Jul-1950  Age: 70 y.o.      Reason for referral: breast cancer  Chief Complaint   Patient presents with   ? Breast Cancer     History was obtained from the patient and medical records.  History of the Present Illness   Robin Spencer is a 70 y.o. post-menopausal female with ER+PR+HER2- (IHC) invasive ductal carcinoma of the left breast, s/p lumpectomy with sentinel lymph node excision 02/29/16, s/p adjuvant XRT 03/27/16-04/25/16, now on tamoxifen.     Robin Spencer presented with acute RIGHT breast pain. On 01/15/16 bilateral diagnostic breast ultrasound, she was found to have an irregular mass measuring 5x6x57mm at 12 o'clock, 6cm from the nipple in the LEFT breast. There was no axillary lymphadenopathy.    Robin Spencer underwent biopsy of the left breast mass on 01/22/16. On pathology, she was found to have an invasive ductal carcinoma, grade 1 with mBRS 5/9, in a background of DCIS (micropapillary and cribriform, no necrosis, low nuclear grade). ER+ (95% 3+), PR+ (40% 1-2+), HER2- (IHC 1+, FISH with HER2/CEN17 ratio 1.0), Ki67 1-2%.     Progress / Interval Events   02/29/16 left lumpectomy with sentinel lymph node biopsy. Invasive ductal carcinoma, grade 1 with mBRS 5/9, 2.8cm. ER+ (90%, 2-3+), PR+ (0%), HER2- (IHC 1+, HER2/CEP17 1.0 with 2.0 copies of HER2 per cell), Ki67 <1%. 2.1cm DCIS (clinging and micropapillary type with focal central necrosis. 0/2 lymph nodes involved. No LVI. pT2N0(i-).  03/27/16-04/18/16 Radiation therapy.   04/16/16 Presents to discuss adjuvant hormonal therapy. Started tamoxifen because continues on hormonal therapy.   08/13/16 Follow up on tamoxifen x3 months. Generally doing well except for hot flashes every 50 minutes. Vaginal atrophy much improved on DHEA. Some residual pain in the left breast.   12/11/16 mammogram + U/S, screening: no evidence of malignancy.  02/25/17 Tolerating tamoxifen well. Worried about her son, who is currently hospitalized for diarrheal illness.  11/30/18 screening tomo and ultrasound. Benign.  12/10/18 diagnosed with fibromyalgia based on painful trigger points, but she doubts this diagnosis.  12/02/19 doing well on tamoxifen.   02/27/21 on tamoxifen. Will complete 5 years 03/2021, but is still on vaginal estrogen.    Review of Systems   Constitutional: No fevers or chills. No weight loss or appetite changes.   Eyes: No recent blurry vision, double vision or visual changes.  ENT: No recent sinus pain or congestion. No sore throat or mouth sores.   Neck: No neck pain or stiffness.  Breasts: healed well. Breast pain, less than prior but persistent.  Cardiovascular: No chest pain or pressure, no palpitations. No swelling.  Pulmonary: No shortness of breath, no cough or wheezing.   Gastrointestinal: No abdominal pain. No nausea/vomiting. No diarrhea or constipation. No melena or BRBPR.   Genitourinary: No urinary frequency, urgency. + dysuria. + pain with sexual intercourse.  Musculoskeletal: No joint pain, muscle pain or back pain.   Neurologic: No headaches. No numbness, tingling or weakness.   Dermatologic: No rash, pruritus or recent skin changes.   Hematologic: No abnormal bruising or bleeding.   Endocrine: No polyuria, polydipsia, heat or cold intolerance. + worsening hot flashes and difficulty sleeping.  Psychiatric: No depressive symptoms. No suicidal or homicidal ideation.     Medical History Surgical History   Past Medical  History:   Diagnosis Date   ? Anxiety    ? Breast cancer (HCC/RAF)    ? Cataract    ? Eczema    ? Fibroids    ? History of migraine headaches    ? History of radiation therapy 04/2016    L breast   ? OSA (obstructive sleep apnea) 11/07/2019   ? Otosclerosis    ? Post-operative nausea and vomiting    ? Rectal fissure    ? Vitreous detachment     Past Surgical History:   Procedure Laterality Date   ? BREAST BIOPSY  1986    Fibroadenoma, ductal hyperplasia   ? Laparoscopy for subfertility     ? Left finger cyst removal x 2     ? Stapedectomy, right          Family History Social History   The family history includes Asthma in her mother; Depression in her father; Endometrial cancer (age of onset: 13) in her mother; Hyperlipidemia in her father; Hypertension in her father and mother; Hyperthyroidism in her mother; Prostate cancer (age of onset: 68) in her brother; Prostate cancer (age of onset: 30) in her father; Stroke in her father. The patient  reports that she has never smoked. She has never used smokeless tobacco. She reports that she does not drink alcohol and does not use drugs.   Ob/Gyn History    OB History   Obstetric Comments   Menses started: 36   Gravida: 0   Para:  0   Age of first live birth: n/a    Breast feed: n/a    Contraceptives/Type:  Y, 18-30    Menopause reached at: 50   Hormone replacement/Type: Y, 50-64    Fertility Tx: Y    Occupational History   ? Not on file     Social History     Social History Narrative    Patient is married, and has two children. She is of Ashkenazi Agricultural engineer.        Work: Retired from [employer] relations        Exercise:        Diet:        Transfusion:        Religion:    Son on the autism spectrum, has bulimia.      Medications     Allergies   Allergen Reactions   ? Penicillins Other (See Comments) and Rash     Ampicillin, flesh rash in 1980s   ? Menadiol Sodium Diphosphate Rash   ? Other      Red [place] coloring on meds such as advil- not allergic to actual med- just the coloring   ? Penicillin G      Current Outpatient Medications   Medication Sig   ? acyclovir 400 mg tablet Take 400 mg by mouth daily.   ? BIOTIN PO Take 1 tablet by mouth daily .     ? calcium carbonate 1500 (600 Ca) mg tablet Take 600 mg by mouth daily.   ? cyanocobalamin 500 mcg tablet Take 500 mcg by mouth daily.   ? Homeopathic Products (TRAUMEEL) OINT Apply 4 g topically daily.   ? Magnesium 250 MG TABS Take 250 mg by mouth daily.   ? MAGNESIUM CITRATE PO Take 150 mg by mouth two (2) times daily.   ? Omega-3 Fatty Acids (FISH OIL PO) Take 1,400 mg by mouth two (2) times daily.   ? tamoxifen 20 mg tablet  Take 1 tablet (20 mg total) by mouth daily.   ? triamcinolone 0.5% cream Apply topically as needed for.   ? UNABLE TO FIND Med Name: [place] supplement- Epimedium, Tumeric, Japanese, Fleeceflower, EPA/DHA .   ? YUVAFEM 10 MCG vaginal tablet PLACE 1 TABLET VAGINALLY TWO TIMES A WEEK.     No current facility-administered medications for this visit.     Objective   LMP  (LMP Unknown)    General: Appears well-developed, well-nourished and close to stated age.   Head: Normocephalic, atraumatic.  Eyes: PERRL without icterus.   ENT: Oropharynx is clear, mucus membranes are moist. No oral ulcers noted. Good dentition.  Neck: Supple. Trachea midline.   CV: No edema.   Chest: Respiratory effort appears normal.   Breasts: (prior: breasts appear normal, no suspicious masses, no skin or nipple changes or axillary nodes. S/p left mastectomy, well-healed.)  Musculoskeletal: No cyanosis. Extremities are warm and well-perfused.   Neurologic: Station appears normal.   Hematologic: No bruising, purpura or petechiae are noted.   Dermatologic: No rashes appreciated.   Psychiatric: Affect appropriate. Pleasant and conversant.   ECOG performance status: 0-Fully active, able to carry on all pre-disease performance without restriction  Laboratory   I have personally reviewed lab results. Labs are normal.  Lab Results   Component Value Date    WBC 5.37 05/12/2019    HGB 13.5 05/12/2019    HCT 40.2 05/12/2019    MCV 93.7 05/12/2019    PLT 228 05/12/2019    CREAT 0.73 02/21/2021    BUN 16 02/21/2021    NA 142 02/21/2021    K 4.8 02/21/2021    CL 106 02/21/2021    CO2 26 02/21/2021    ALT 16 02/21/2021    AST 21 02/21/2021    ALKPHOS 66 02/21/2021    BILITOT 0.5 02/21/2021    ALBUMIN 4.3 02/21/2021       Pathology   I have personally reviewed all available pathology reports   03/04/16 left breast lumpectomy  FINAL DIAGNOSIS   Date Value Ref Range Status   02/29/2016   Final      A.  BREAST, LEFT (WIRE LOCALIZED LUMPECTOMY):  - Invasive ductal carcinoma, grade 1, 2.8 cm in greatest diameter (4/9 consecutive levels by slice method)   Modified Bloom Richardson Score: 5 of 9      Tubule formation: 2      Nuclear pleomorphism: 2      Mitotic score: 1  - In situ carcinoma (DCIS): present, 2.1 cm in greatest diameter (3/9 consecutive levels by slice method), clinging and micropapillary type with focal central necrosis  - Lumpectomy surgical margins: negative for invasive or in situ carcinoma (see B-G for final margin status)   Invasive ductal carcinoma:     Lateral margin: 7 mm    Inferior margin: 7 mm   Ductal Carcinoma in situ:     Lateral margin: 4 mm    Inferior margin: 2 mm  - Lymph/vascular invasion: not identified  - Microcalcifications: not identified  - Pathologic staging: p2 (sn)N0(i-) See synoptic report for case details  - Breast biomarkers: See Immunohistochemistry report below  B.  LEFT BREAST, ANTERIOR MARGIN (EXCISION):  - Fibroglandular breast tissue  - Negative for invasive or in situ malignancy    C.  LEFT BREAST, INFERIOR MARGIN (EXCISION):  - Fibroglandular breast tissue  - Negative for invasive or in situ malignancy    D.  LEFT BREAST, LATERAL MARGIN (EXCISION):  - Fibroglandular breast tissue  - Negative for invasive or in situ malignancy    E.  LEFT BREAST, SUPERIOR MARGIN (EXCISION):  - Fibroglandular and fatty breast tissue  - Negative for invasive or in situ malignancy    F.  LEFT BREAST, MEDIAL MARGIN (EXCISION):  - Fibroglandular breast tissue  - Negative for invasive or in situ malignancy    G.  LEFT BREAST, DEEP MARGIN (EXCISION):  - Fibroadipose tissue  - Negative for invasive or in situ malignancy    H.  SENTINEL LYMPH NODES, LEFT AXILLA (LYMPHADENECTOMY):  - Two lymph nodes, negative for malignancy (0/2)        IHC REPORT   Date Value Ref Range Status   02/29/2016   Final    Breast Biomarkers: Block A6    RESULT ER/PR   ESTROGEN RECEPTORS PROGESTERONE RECEPTORS   Antibody Clone SP1 Clone 636   %Tumor Staining 90% 0%   Intensity (1+ to 3+) 2-3+ NA   Leica Bond III with Refine Polymer Detection System, using Heat retrieval for 20 minutes with pH6 buffer. Clone SP1 Diluted to 1/50 and PR636 to 1/200.    ESTROGEN/PROGESTERONE IMMUNOHISTOCHEMICAL REPORT  Using appropriate positive and negative controls, the test for the presence of these hormone receptor proteins is performed by the immunoperoxidase method, and reported according to the 2009 CAP-ASCO Guidelines for Hormone Receptor testing. Tissue is fixed from 6-72 hours in 10% neutral buffered formalin. A positive ER or PR tumor shows greater than or equal to 1 percent of cells staining, and results are semi-quantitated as indicated above.      RESULT: HER-2/neu IHC assay (utilizing FDA-approved DAKO Hercep Test): J. Clin. Oncol 2013; 1-18, and Arch Pathol Lab Med 1610:9-60.     Test Score:   1+    HER2/neu:  No overexpression    0 no staining observed or membrane staining that is incomplete and is faint/barely perceptible and within ? 10% of tumor cells No overexpression   1+ Incomplete membrane staining that is faint/barely perceptible and within > 10% of tumor cells No overexpression   2+ circumferential membrane staining that is incomplete, and/or weak/moderate within > 10% of tumor cells or complete and circumferential membrane staining that is intense and within ? 10% of tumor cells   Equivocal   3+ circumferential membrane staining that is complete, intense, and within > 10% of tumor cells Overexpression Note: FISH gene amplification testing. Please see separate addendum report.    Ki-67: <1%    Note: The immunoperoxidase stains reported above for ER, PR, and Ki-67 were developed and their performance characteristics determined by Department of Pathology & Laboratory Medicine, [facility], [place]. They have not been cleared or approved by the U.S. Food and Drug Administration, although such approval is not required for analyte-specific reagents of this type.    GENERAL IMMUNOHISTOCHEMISTRY REPORT    BLOCK:  H1/H2  FIXATIVE:  Formalin    ANTIBODY/PROBE: RESULT/COMMENT  CK7   Negative  PanKer   Negative     INTERPRETATION:  Negative for isolated tumor cells    Note: The immunoperoxidase stain reported above was developed and its performance characteristics determined by  [facility].  It has not been cleared or approved by the U.S. Food and Drug Administration, although such approval is not required for analyte-specific reagents of this type.  Appropriate positive and negative controls are included for each case.         FISH REPORT   Date Value Ref Range Status   02/29/2016   Final    FINAL DIAGNOSIS: ERBB2 (HER2) FISH:  Negative for HER2 gene amplification     HER2/CEP17 ratio: 1.0   Average HER2 copy number per cell: 2.0     KARYOTYPE:    nuc ish(D17Z1,ERBB2)x2[20]      INTERPRETATION:  FISH analysis with the dual color probes specific for the centromere of chromosome 17 and the ERBB2 (HER2) gene (17q11.2) (multiplex1) was performed and examined by two independent technologists.  These studies showed no evidence of HER2 amplification in the 20 nuclei analyzed, per the FDA-approved criteria (package insert).    Average HER2 copy number: 2.0 signals/cell    D17Z1: Green signals 40  HER-2: Red signals  40  Ratio:  R/G   1.0      AMPLIFIED:  Dual-probe HER2/CEP17 ratio ? 2.0 with an average HER2 copy number ? 4.0 signals per cell   Dual-probe HER2/CEP17 ratio ? 2.0 with an average HER2 copy number < 4.0 signals/cell  Dual-probe HER2/CEP17 ratio < 2.0 with an average HER2 copy number ? 6.0 signals/cell    NOT AMPLIFIED: Dual-probe HER2/CEP17 ratio < 2.0 with an average HER2 copy number < 4.0 signals/cell    EQUIVOCAL: Dual-probe HER2/CEP17 ratio < 2.0 with an average HER2 copy number ? 4.0 and < 6.0 signals/cell    INDETERMINATE: NO FISH results due to technical issues (Inadequate specimen handling; Artifacts; Analytic testing failure); Recommended to send another specimen for testing to determine HER2 status.    References: Veronia Beets al, ASCO/CAP HER2 Testing Guideline Update ? 2013 College of American Pathologists    Note: The PathVysion HER2 DNA Probe Kit (PathVysion Kit) which is FDA approved is designed to detect amplification of the HER-2/neu gene via fluorescence in situ hybridization (FISH) in formalin-fixed, paraffin-embedded human breast cancer tissue specimens. The nuclei counted in these studies were within the region selected by a pathologist.  Specimens were fixed in 10% neutral buffered formalin for 6-72 hours. The FISH test was developed and its performance characteristics determined by [facility].       01/22/16 core biopsy [[facility]]  BREAST, LEFT, 12:00?(NEEDLE CORE BIOPSY):  - Invasive ductal carcinoma, Grade 1?(50% of biopsy, longest involved segment spans 0.5 cm)  ???????Modified Bloom?and?Richardson Score: 5 of 9  ?????????????Tubule formation:????????????????2  ?????????????Nuclear pleomorphism:???????2  ?????????????Mitotic score:???????????????????????1 (1/10 HPF, 0.55 mm field diameter)  - Ductal carcinoma in situ (DCIS), micropapillary and cribriform, without central necrosis, low nuclear grade  - Lymph/vascular invasion: Not identified  - Microcalcifications: Not identified   - Breast biomarkers: See report below  - HER2/neu FISH:?Pending?and will be reported separately  RESULT ER/PR  ? ESTROGEN RECEPTORS PROGESTERONE RECEPTORS   Antibody Clone SP1               Clone 636   %Tumor Staining 95% 40%   Intensity (1+ to 3+) 3+ 1-2+   HER-2/neu IHC assay (utilizing FDA-approved DAKO Hercep Test): J. Clin. Oncol 2013; 1-18, and Arch Pathol Lab Med 0454:0-98.   ?  Test Score:   1+  ?  HER2/neu: No overexpression  Ki-67: 1-2%    Imaging   I independently reviewed imaging.  12/02/19 bilateral breast tomo and U/S, screening  MAMMOGRAM FINDINGS:     The breast tissue is extremely dense, which lowers the sensitivity of mammography (density D).     Finding 1:  There are post-surgical changes seen in both breasts.     No significant interval change is identified.     ULTRASOUND FINDINGS:     Finding 2:  Whole breast and axillary sonography was performed in both breasts. There is no evidence  of any suspicious solid mass or abnormal cystic elements.     No axillary lymphadenopathy.     IMPRESSION:     ?  Finding 1:  There is no mammographic evidence of malignancy.     Finding 2:  There is no sonographic evidence of malignancy.     Routine screening in 1 year is recommended.     The patient has a history of breast cancer. The Tyrer-Cuzick risk model does not apply.     BI-RADS Category 2:  Benign Finding(s)       12/01/2018 bilateral breast tomo + U/S screening     MAMMOGRAM FINDINGS:     The breast tissue is extremely dense, which lowers the sensitivity of mammography (density D).     There are post-surgical scars seen in both breasts.     No suspicious masses, calcifications or other abnormalities are seen in either breast.     ULTRASOUND FINDINGS:     Whole breast and axillary sonography was performed in both breasts. Ultrasound demonstrates post  surgical scars seen in both breasts.     There is no evidence of any suspicious solid mass or abnormal cystic elements.     IMPRESSION:  Post surgical scars in both breasts are benign.  Routine screening mammogram in 1 year is recommended.     BI-RADS Category 2:  Benign Finding(s)    12/11/16 bilateral breast tomo + U/S screening  MAMMOGRAM FINDINGS:     The breast tissue is extremely dense, which lowers the sensitivity of mammography (density D).     There are post-surgical changes seen in the left breast at 12 o'clock, upper outer quadrant.     No suspicious masses, calcifications or other abnormalities are seen in either breast.     No significant interval change is identified.     ULTRASOUND FINDINGS:     Whole breast and axillary sonography was performed in both breasts.     Ultrasound demonstrates a scar seen in the left breast at 12 o'clock, upper outer quadrant.     There is no evidence of any suspicious solid mass or abnormal cystic elements.     In the right breast, there is no evidence of any suspicious solid mass or other abnormality.     No axillary lymphadenopathy.     IMPRESSION:     ?  There is no mammographic or sonographic evidence of malignancy.     Routine screening mammogram in 1 year is recommended.     BI-RADS Category 2:        Benign Finding(s)    01/15/2016 Bilateral diagnostic breast ultrasound  RIGHT DIAGNOSTIC BREAST ULTRASOUND AND LEFT DIAGNOSTIC BREAST ULTRASOUND   ULTRASOUND FINDINGS:    Finding 1:  Targeted breast and axillary sonography was performed in the left breast in the area of the pain in the upper outer quadrant. There is an irregular not parallel vascular irregular mass with indistinct margins measuring 5 x 6 x 4 mm seen in the left breast at 12  o'clock located 6 centimeters from the nipple. Internal echotexture is hypoechoic. There are decreased posterior echoes; edge shadows are excluded. This mass is seen along the medial border of the left breast scar.    Finding 2:  Targeted breast and axillary sonography was performed in the right breast in the area of the pain. There is no sonographic correlate in the area of the pain in the lower outer quadrant of the right breast.  No axillary lymphadenopathy.    IMPRESSION:    Finding 1:  Irregular mass in the upper outer quadrant of the left breast is suspicious. An ultrasound guided biopsy is recommended. Although this structure may represent post-surgical change/scar, tissue diagnosis is suggested.  Findings and recommendations were discussed with the patient following the procedure, and she is amenable to biopsy. The recommendation for biopsy was also communicated to ordering provider Anselm Jungling via email.  Finding 2:  There is no evidence of malignancy.                                                                                 11/03/11 DEXA [ULCA]  PA Spine:       The average bone mineral density from L1-4 is 0.787 g/cm2, 2.4 standard deviations below the mean for normal young adults (*T-scores) or 75% of normal. Compared to age and sex-matched controls (**Z-scores), this density is 0.9 standard deviations   below the mean.  ?  Total Hip:       The bone mineral density in the left total hip is 0.706 g/cm2, 1.9 standard deviations below the mean for normal young adults (*T-scores) or 75% of normal.  Compared to age and sex-matched controls (**Z-scores), this density is 1.0 standard   deviations below the mean.  ?  Femoral Neck:       The bone mineral density in the left femoral neck is 0.531 g/cm2, 2.9 standard deviations below the mean for normal young adults (*T-scores) or 63% of normal.  Compared to age and sex-matched controls (**Z-scores), this density is 1.6 standard   deviations below the mean.  ?  *T-score compares the BMD of an individual to peak bone mass and expresses the difference as a standard deviation score,**Z-score compares the BMD of an individual with age-matched, gender-matched, and race-matched controls and expresses the difference   as a standard deviation score.  ?  IMPRESSION:  1.  This is a baseline study at the [facility].  2.  The patient has osteoporosis.    Assessment & Recommendations   Alexias Margerum was seen in Multidisciplinary Breast Clinic with NP Samuel Jester, Surgical Oncologist Dr William Hamburger, Radiation Oncologist Dr Hilaria Ota, and Simms-Mann.    Tierre Gerard is a 69 y.o. post-menopausal female with ER+PR+HER2- (IHC) invasive ductal carcinoma of the left breast s/p lumpectomy with sentinel lymph node excision 02/29/16, s/p adjuvant XRT 03/27/16-04/25/16, now on tamoxifen.      ICD-10-CM    1. Invasive ductal carcinoma of left breast, ER+PR+HER2-  C50.912       2. Care related to current tamoxifen use  Z79.810       3. S/P breast lumpectomy  Z98.890  4. History of therapeutic radiation  Z92.3         1. ER+PR+HER2- invasive ductal carcinoma in a menopausal woman. Stage IIA (pT2N0) based on pathological staging. I discussed an aromatase inhibitor x5 years vs tamoxifen x5-10 years. Side effects of aromatase inhibitors include loss of bone density, menopausal symptoms such as hot flashes, and joint stiffness/muscle aches. The side effects of tamoxifen include increased risk of blood clots, a low risk of endometrial cancer, and interference with medications cleared through the cytochrome P450 pathway in the liver (including the first generation SSRIs and coumadin).   - s/p Lumpectomy with sentinel lymph node biopsy with Dr Francella Solian 02/29/16.  - s/p radiation therapy 03/27/16-04/25/16.   - Robin Manson Passey and I discussed starting tamoxifen x5-10 years instead of an aromatase inhibitor for the following reasons: 1) she's on low-dose HRT (DHEA, which is converted to estrogen) for vaginal atrophy and 2) she has osteoporosis. We discussed the risk of uterine cancer (04/998) in particular because Robin Crayton's mother had uterine cancer. Based on family history, I don't have a suspicion for Lynch syndrome. She is tolerating tamoxifen well and continues to use vaginal estrogens, so will keep her on tamoxifen.     2. Osteoporosis.  DEXA 04/17/16 showed osteoporosis of the lumbar spine.  - Baseline DEXA scan prior to aromatase inhibitor, then q2 years. Currently she's on tamoxifen, so her bone density should increase.  - I recommend VitD 2000 U/day  - recommend calcium 1200mg /day in divided doses  - weight bearing exercise    3. Vaginal atrophy.   - As long as she's on tamoxifen, vaginal estrogen supplementation is safe. Often gyn uses higher dose estrogen initially to build up the lining of the vagina, then tapers off to a maintenance dose.  - provided list of moisturizers and lubricants to try.     4. Genetic counseling and testing. Does not meet criteria for genetic testing.     Return to Clinic: 12 months    I have personally spent 30 minutes counseling and coordinating care for the items checked below on the day of service.   [x]  Preparing to see the patient (e.g., review of tests)  []  Obtaining and or reviewing separately obtained history   [x]  Performing a medically appropriate examination and/or evaluation   [x]  Counseling and educating the patient/family/caregiver   [x]  Ordering medications, tests, or procedures  []  Referring and communicating with other healthcare professionals (when not separately reported)  [x]  Documenting clinical information in the EHR  [x]  Independently interpreting results and communicating results to patient/ family/caregiver    Evonnie Dawes, MD, PhD  [facility]   [facility] Hematology/Oncology  [email or web address]  Pager [phone]    [facility] Hematology/Oncology [place]  [address] Karyl Kinnier [place], [place] [address]  Phone [phone], Fax [phone]  Appts: Wednesday 1-5PM, Friday 8-5PM    [facility] Hematology/Oncology   [address]  [place], [place], [address]  Phone [phone], Fax [phone]  Appts: Thursday 8-5PM

## 2021-03-05 ENCOUNTER — Non-Acute Institutional Stay: Payer: BC Managed Care – POS

## 2021-03-05 ENCOUNTER — Ambulatory Visit: Payer: BC Managed Care – POS

## 2021-03-05 DIAGNOSIS — C50812 Malignant neoplasm of overlapping sites of left female breast: Secondary | ICD-10-CM

## 2021-03-05 DIAGNOSIS — Z170 Estrogen receptor positive status [ER+]: Secondary | ICD-10-CM

## 2021-04-06 MED ORDER — ACYCLOVIR 400 MG PO TABS
400 mg | Freq: Every day | ORAL
Start: 2021-04-06 — End: ?

## 2021-04-10 MED ORDER — ACYCLOVIR 400 MG PO TABS
400 mg | ORAL_TABLET | Freq: Two times a day (BID) | ORAL | 0 refills | 30.00000 days
Start: 2021-04-10 — End: ?

## 2021-04-11 ENCOUNTER — Ambulatory Visit: Payer: BC Managed Care – POS

## 2021-04-11 ENCOUNTER — Non-Acute Institutional Stay: Payer: BC Managed Care – POS

## 2021-04-11 NOTE — Telephone Encounter
[place] [phone])    Nursing Note: Prescription Request - [facility] Pharmacists    Patient: Robin Spencer MRN: [id number] DOB: 1950-09-18 AGE: 71 y.o.  Listed PCP: Ranae Pila. Pregler, MD     There is a 24-72 hours turn around time for prescription request/s.   Date and time routed to [facility] Pharmacists for review: 04/11/2021 at 12:31 PM   [facility] 250: Dr. Marcie Bal / Dr. Ronaldo Miyamoto / Dr. Kaleen Odea / Dr. Marcie Bal Pregler / Dr. Nile Dear / Dr. Lilia Pro   [facility] 290: Dr. Wynona Neat / Dr. Ranae Plumber / Dr. Illa Level    Dr. Levada Dy Ruman's last day at [facility] 250 was last 12/11/2020.    [facility] pharmacists are covering prescription refills until 12/12/2021.     Requested Prescriptions     Pending Prescriptions Disp Refills    acyclovir 400 mg tablet 90 tablet 3     Sig: Take 1 tablet (400 mg total) by mouth daily.     Future appointment/s:   Future Appointments   Date Time Provider [place]   05/27/2021 10:30 AM Pregler, Ranae Pila., MD INT MED 420 MEDICINE   02/19/2022  3:00 PM MP2 MAM Clotilde Dieter Tilden Dome) MAM MP2 Dorthy Cooler     Preferred Pharmacy:   [id number] [address], [place]  [place] [address]  Phone: [phone] Fax: [place], [place] Vita Barley)   04/11/2021 ~ 12:31 PM

## 2021-04-12 MED ORDER — ACYCLOVIR 400 MG PO TABS
400 mg | ORAL_TABLET | Freq: Every day | ORAL | 3 refills | 30.00000 days | Status: AC
Start: 2021-04-12 — End: ?

## 2021-04-25 ENCOUNTER — Ambulatory Visit: Payer: BLUE CROSS/BLUE SHIELD

## 2021-04-26 ENCOUNTER — Non-Acute Institutional Stay: Payer: BC Managed Care – POS

## 2021-04-26 DIAGNOSIS — R718 Other abnormality of red blood cells: Secondary | ICD-10-CM

## 2021-05-06 ENCOUNTER — Ambulatory Visit: Payer: BLUE CROSS/BLUE SHIELD

## 2021-05-13 NOTE — Telephone Encounter
Patient has been notified via my chart MD is out of the office today 05/13/2021 , we do have a covering MD for urgent message . This message can wait for Dr. Lysbeth Penner upon arrival to the clinic 05/14/2021 thanks

## 2021-05-16 ENCOUNTER — Telehealth: Payer: BLUE CROSS/BLUE SHIELD

## 2021-05-17 NOTE — Telephone Encounter
Appointment Accommodation Request      Appointment Type: Physical     Reason for sooner request: Pt recived a call to reschuled upcoming appt for 05/27/2021. Pt is flying in from [place] for this appt and will be here that week. No open appt until 07/15/2021. Pt has been in contact with Dr Lysbeth Penner getting all her records sent over.     Date/Time Requested (If any): Week of 03/06 or 03/13    Last seen by MD: 10/01/2020    Any Symptoms:  []  Yes  [x]  No       If yes, what symptoms are you experiencing:   o Duration of symptoms (how long):     Patient or caller was offered an appointment but declined.    Patient or caller was advised to seek emergency services if conditions are urgent or emergent.    Patient or caller has been notified of the turnaround time of 1-2 business (days).

## 2021-05-17 NOTE — Telephone Encounter
Spoke to pt. Yes to new appt. Day and time. She thanks [place]

## 2021-05-21 ENCOUNTER — Inpatient Hospital Stay: Payer: BLUE CROSS/BLUE SHIELD

## 2021-05-21 DIAGNOSIS — R6889 Other general symptoms and signs: Secondary | ICD-10-CM

## 2021-05-23 ENCOUNTER — Non-Acute Institutional Stay: Payer: BLUE CROSS/BLUE SHIELD

## 2021-05-24 ENCOUNTER — Non-Acute Institutional Stay: Payer: BC Managed Care – POS | Attending: "Endocrinology

## 2021-05-27 ENCOUNTER — Ambulatory Visit: Payer: BLUE CROSS/BLUE SHIELD

## 2021-05-28 ENCOUNTER — Ambulatory Visit: Payer: BC Managed Care – POS

## 2021-05-28 ENCOUNTER — Telehealth: Payer: BLUE CROSS/BLUE SHIELD

## 2021-05-28 DIAGNOSIS — L03213 Periorbital cellulitis: Secondary | ICD-10-CM

## 2021-05-28 DIAGNOSIS — L03211 Cellulitis of face: Secondary | ICD-10-CM

## 2021-05-28 DIAGNOSIS — H00012 Hordeolum externum right lower eyelid: Secondary | ICD-10-CM

## 2021-05-28 MED ORDER — LEVOFLOXACIN 750 MG PO TABS
750 mg | ORAL_TABLET | Freq: Every day | ORAL | 0 refills | Status: AC
Start: 2021-05-28 — End: 2021-05-30

## 2021-05-28 NOTE — Progress Notes
Immediate Care Note      Date of Service: 05/28/2021    Subjective:     HPI: Robin Spencer is a 71 y.o. female here for:    2 days lower eyelid pain, erythema, and pain.  No changes in vision. No pain on EOM  No fever    No symptoms except for the above.    Past Medical History:  She has a past medical history of Anxiety, Breast cancer (HCC/RAF), Cataract, Eczema, Fibroids, History of migraine headaches, History of radiation therapy (04/2016), OSA (obstructive sleep apnea) (11/07/2019), Otosclerosis, Post-operative nausea and vomiting, Rectal fissure, and Vitreous detachment.    Past Surgical History:  She has a past surgical history that includes Laparoscopy for subfertility; Breast biopsy (1986); Stapedectomy, right; and Left finger cyst removal x 2.    Medication and Supplements:  Outpatient Medications Prior to Visit   Medication Sig   ? acyclovir 400 mg tablet Take 1 tablet (400 mg total) by mouth daily.   ? BIOTIN PO Take 1 tablet by mouth daily .     ? calcium carbonate 1500 (600 Ca) mg tablet Take 600 mg by mouth daily.   ? cyanocobalamin 500 mcg tablet Take 500 mcg by mouth daily.   ? Homeopathic Products (TRAUMEEL) OINT Apply 4 g topically daily.   ? Magnesium 250 MG TABS Take 250 mg by mouth daily.   ? MAGNESIUM CITRATE PO Take 150 mg by mouth two (2) times daily.   ? Omega-3 Fatty Acids (FISH OIL PO) Take 1,400 mg by mouth two (2) times daily.   ? tamoxifen 20 mg tablet Take 1 tablet (20 mg total) by mouth daily.   ? triamcinolone 0.5% cream Apply topically as needed for.   ? UNABLE TO FIND Med Name: [place] supplement- Epimedium, Tumeric, Japanese, Fleeceflower, EPA/DHA .   ? YUVAFEM 10 MCG vaginal tablet PLACE 1 TABLET VAGINALLY TWO TIMES A WEEK.     No facility-administered medications prior to visit.       Allergies:  Allergies   Allergen Reactions   ? Penicillins Other (See Comments) and Rash     Ampicillin, flesh rash in 1980s   ? Menadiol Sodium Diphosphate Rash   ? Other      Red [place] coloring on meds such as advil- not allergic to actual med- just the coloring   ? Penicillin G        Social History:  She reports that she has never smoked. She has never used smokeless tobacco. She reports that she does not drink alcohol and does not use drugs.    Review of Symptoms:  ROS Negative except for the above.    Objective:     Physical Exam  BP (P) 118/76  ~ Pulse (P) 79  ~ Temp (P) 36.2 ?C (97.1 ?F) (Forehead)  ~ Resp (P) 18  ~ LMP  (LMP Unknown)  ~ SpO2 (P) 98%     General: alert, well appearing, and in no distress  Head: Atraumatic, normocephalic  Ears: hearing grossly normal  Eyes: EOM intact without pain, PERRL, no discrete nodule or lump, generalized R lower eyelid swelling/erythema/tenderness, visual fields intact  Mouth/Throat: mucous membranes moist  Neck: supple, trachea appears midline.  Skin: normal coloration and turgor, no rashes, no suspicious skin lesions noted.  Neuro: alert, oriented, normal speech, no focal findings or movement disorder noted    Assessment:     1. Preseptal cellulitis      Orders Placed This Encounter   ?  levoFLOXacin 750 mg tablet   Medication mgmt: allergic to pens. Will use levo    Previous records reviewed  Risks and benefits of medications/treatments discussed and acknowledged  Follow up with PCP  Strict return/ED precautions given. Instructed to return to urgent/ED if symptoms persist or worsen.    Alinda Sierras, MD, MPH

## 2021-05-28 NOTE — Patient Instructions
Follow up with your primary care physician  Return to urgent care or emergency room if symptoms worsen or persist

## 2021-05-29 ENCOUNTER — Non-Acute Institutional Stay: Payer: BLUE CROSS/BLUE SHIELD

## 2021-05-29 ENCOUNTER — Ambulatory Visit: Payer: BC Managed Care – POS

## 2021-05-29 ENCOUNTER — Inpatient Hospital Stay
Admit: 2021-05-29 | Discharge: 2021-05-29 | Disposition: A | Discharge status: Home / Self Care | Payer: BC Managed Care – POS | Source: Home / Self Care

## 2021-05-29 DIAGNOSIS — H00019 Hordeolum externum unspecified eye, unspecified eyelid: Secondary | ICD-10-CM

## 2021-05-29 DIAGNOSIS — H9191 Unspecified hearing loss, right ear: Secondary | ICD-10-CM

## 2021-05-29 DIAGNOSIS — C50919 Malignant neoplasm of unspecified site of unspecified female breast: Secondary | ICD-10-CM

## 2021-05-29 DIAGNOSIS — M810 Age-related osteoporosis without current pathological fracture: Secondary | ICD-10-CM

## 2021-05-29 MED ORDER — CEFPODOXIME PROXETIL 200 MG PO TABS
400 mg | ORAL_TABLET | Freq: Two times a day (BID) | ORAL | 0 refills | Status: AC
Start: 2021-05-29 — End: ?

## 2021-05-29 MED ADMIN — CEFPODOXIME PROXETIL 200 MG PO TABS: 400 mg | ORAL | @ 06:00:00 | Stop: 2021-05-29 | NDC 65862009620

## 2021-05-29 NOTE — ED Provider Notes
Robin Spencer  Emergency Department Service Report    Robin Spencer 71 y.o. female , presents with Facial Swelling      Triage   Arrived on 05/28/2021 at 10:01 PM   Arrived by Walk-in [14]    ED Triage Vitals   Temp Temp Source BP Heart Rate Resp SpO2 O2 Device Pain Score Weight   05/28/21 2205 05/28/21 2205 05/28/21 2205 05/28/21 2205 05/28/21 2205 05/28/21 2205 05/28/21 2205 05/28/21 2214 05/28/21 2215   37.5 ?C (99.5 ?F) Temporal 117/77 82 18 96 % None (Room air) Two 60.3 kg (133 lb)       Pre Spencer care:       Allergies   Allergen Reactions   ? Penicillins Other (See Comments) and Rash     Ampicillin, flesh rash in 1980s   ? Menadiol Sodium Diphosphate Rash   ? Other      Red [place] coloring on meds such as advil- not allergic to actual med- just the coloring   ? Penicillin G        History   Patient is a 71 y.o. female with hx of breast cancer who presents to the ED with complaint of facial swelling x 3 days. Reports that it started around her eyelids and has now spread to her cheek. Pt was sent in by her PCP. Pt took tab levofloxacin today.      The history is provided by the patient. No language interpreter was used.   Illness   Episode onset: 3 days ago. The onset was gradual. The problem occurs continuously. The problem has been gradually worsening. The problem is mild.            Past Medical History:   Diagnosis Date   ? Anxiety    ? Breast cancer (HCC/RAF)    ? Cataract    ? Eczema    ? Fibroids    ? History of migraine headaches    ? History of radiation therapy 04/2016    L breast   ? OSA (obstructive sleep apnea) 11/07/2019   ? Otosclerosis    ? Post-operative nausea and vomiting    ? Rectal fissure    ? Vitreous detachment         Past Surgical History:   Procedure Laterality Date   ? BREAST BIOPSY  1986    Fibroadenoma, ductal hyperplasia   ? Laparoscopy for subfertility     ? Left finger cyst removal x 2     ? Stapedectomy, right          Past Family History   family history includes Asthma in her mother; Depression in her father; Endometrial cancer (age of onset: 51) in her mother; Hyperlipidemia in her father; Hypertension in her father and mother; Hyperthyroidism in her mother; Prostate cancer (age of onset: 63) in her brother; Prostate cancer (age of onset: 85) in her father; Stroke in her father.                 Past Social History   she reports that she has never smoked. She has never used smokeless tobacco. She reports being sexually active and has had partner(s) who are female. She reports using the following method of birth control/protection: None. She reports that she does not drink alcohol and does not use drugs.       Physical Exam   Physical Exam  Vitals and nursing note reviewed.   Constitutional:  General: She is not in acute distress.     Appearance: She is well-developed.   HENT:      Head: Normocephalic and atraumatic.   Eyes:      Conjunctiva/sclera: Conjunctivae normal.      Comments: Stye to the R eyelid with surrounding erythema to the eyelid and upper cheek   Cardiovascular:      Rate and Rhythm: Normal rate and regular rhythm.   Pulmonary:      Effort: Pulmonary effort is normal. No respiratory distress.      Breath sounds: Normal breath sounds.   Abdominal:      Palpations: Abdomen is soft.      Tenderness: There is no abdominal tenderness. There is no guarding or rebound.   Musculoskeletal:         General: Normal range of motion.      Cervical back: Normal range of motion and neck supple.   Lymphadenopathy:      Cervical: No cervical adenopathy.   Skin:     General: Skin is warm and dry.   Neurological:      Mental Status: She is alert.      Sensory: No sensory deficit.      Comments: Moving all four extremities.   Psychiatric:         Behavior: Behavior normal.         ED Course          Laboratory Results   Labs Reviewed - No data to display    Imaging Results     No orders to display       Administered Medications     Medication Administration from 05/28/2021 2201 to 05/28/2021 2249     None          Procedures   Procedural Sedation  Procedures    Medical Decision Making     MDM   ED Course:  Nursing note reviewed.  Previous medical records were obtained and reviewed by myself    I visited the patient to obtain history and perform the physical exam. Patient will be administered cefpodoxime    Medical Decision Making:  [name] presents to the ED today with chief complaint of facial redness and swelling.  On exam, the patient exhibits Stye to the R eyelid with surrounding erythema to the eyelid and upper cheek.     Given the patient's physical exam, laboratory analysis, and imaging studies, my impression is the patient is experiencing an episode of hordeolum externum of right lower eyelid and preseptal cellulitis of right lower eyelid.  Therefore, I feel comfortable discharging the patient home with close outpatient follow up.    Discharge Instructions:  I advised the patient to follow up with their primary care physician on an outpatient basis over the next 24-48 hours, and recommended prompt  return to the emergency department if they have additional concerns or note worsening symptoms.  The patient indicates understanding of these issues and agrees with the plan.  A copy of the ED  workup results was provided to the patient as well.    Clinical Impression     1. Hordeolum externum of right lower eyelid    2. Preseptal cellulitis of right lower eyelid          Prescriptions     New Prescriptions    CEFPODOXIME 200 MG TABLET    Take 2 tablets (400 mg total) by mouth two (2) times daily for 13 doses.  Disposition and Follow-up   Disposition: Discharge [1]    Future Appointments   Date Time Provider Department Center   05/30/2021  9:30 AM Pregler, Weyman Croon., MD INTMED [phone] [place]/Cen   02/19/2022  3:00 PM MP2 MAM Sharlene Dory Juanita Laster) MAM MP2 [place]/Cen       Follow up with:  Pregler, Weyman Croon., MD  [address]  [address] [place] [address]  [phone]    In 3 days        Return precautions are specified on After Visit Summary.    The documentation on this chart was performed by Loleta Rose, scribed for Elsie Stain., MD    05/28/2021 10:35 PM     ***

## 2021-05-29 NOTE — Telephone Encounter
Phone contact with patient.  PCP= Kaylean Tupou, Weyman Croon., MD      Robin Spencer is a 71 y.o. with eye lid cellulitis today, now redness spread to cheek.  Took levofloxacin x 1 dose.     Assessment: Facial cellulitis, worsening.   Plan: To [facility] ED for assessment for need for IV abx. Patient agrees.           Allergies   Allergen Reactions   ? Penicillins Other (See Comments) and Rash     Ampicillin, flesh rash in 1980s   ? Menadiol Sodium Diphosphate Rash   ? Other      Red [place] coloring on meds such as advil- not allergic to actual med- just the coloring   ? Penicillin G        Current Outpatient Medications   Medication Sig   ? acyclovir 400 mg tablet Take 1 tablet (400 mg total) by mouth daily.   ? BIOTIN PO Take 1 tablet by mouth daily .     ? calcium carbonate 1500 (600 Ca) mg tablet Take 600 mg by mouth daily.   ? cyanocobalamin 500 mcg tablet Take 500 mcg by mouth daily.   ? Homeopathic Products (TRAUMEEL) OINT Apply 4 g topically daily.   ? levoFLOXacin 750 mg tablet Take 1 tablet (750 mg total) by mouth daily.   ? Magnesium 250 MG TABS Take 250 mg by mouth daily.   ? MAGNESIUM CITRATE PO Take 150 mg by mouth two (2) times daily.   ? Omega-3 Fatty Acids (FISH OIL PO) Take 1,400 mg by mouth two (2) times daily.   ? tamoxifen 20 mg tablet Take 1 tablet (20 mg total) by mouth daily.   ? triamcinolone 0.5% cream Apply topically as needed for.   ? UNABLE TO FIND Med Name: [place] supplement- Epimedium, Tumeric, Japanese, Fleeceflower, EPA/DHA .   ? YUVAFEM 10 MCG vaginal tablet PLACE 1 TABLET VAGINALLY TWO TIMES A WEEK.     No current facility-administered medications for this visit.       Patient Active Problem List    Diagnosis Date Noted   ? Lymphadenopathy of head and neck 04/30/2021     2023, bilateral, noted in [place], follow up recommended.  ; follow up CT scan no adenopathy, report sent via [facility] 05/13/2021.      ? Abnormal RBC 04/30/2021     2023 outside lab read nucleated RBC= 1, follow up CBC ordered. ? Hypervitaminosis D 09/28/2020     2022     ? Esophageal motility disorder 05/17/2020     2022:  Esophageal dysmotility:  Improved.  Bravo study not consistent with GERD.     ? History of COVID-19 05/17/2020     02/2020, dx'd in [place]     ? Vaginal atrophy 05/16/2020   ? Schatzki's ring 12/01/2019     On EGD 2021 By Dr. Cleda Clarks, Bravo placed.  No Barrett's esophagus.      ? Dyspepsia      2021 with sx, saw Dr. Donnamarie Poag, plan: PPI, will plan for EGD w/ Bravo to confirm or disprove reflux given atypical presentation of symptoms. EGD 2021 Dr. Cleda Clarks:  ESOPHAGUS, PROXIMAL, INLET PATCH (BIOPSY):  - Gastric oxyntic heteropia, consistent with inlet patch- No intestinal metaplasia, BRAVO placed. BRAVO:  Negative study for gastroesophageal reflux disease.  Consider workup of supragastric belching if clinically indicated..  Plan follow up Dr. Donnamarie Poag.     ? OSA (obstructive sleep apnea) 11/07/2019   ?  Other fracture of right great toe, initial encounter for closed fracture 05/06/2019     2021:  Patient presents with right big toe pain, swelling, and tenderness after trauma last night. X-ray completed during this visit confirms fracture of big toe.  - Reviewed x-ray findings and images with patient  - STAT referral to patient's podiatrist for ongoing management  - Provided post-op shoe with firm sole and crutches to patient, discussed importance of avoiding any bending of the toe  - Reviewed symptomatic care with Tylenol/Ibuprofen PRN, elevation, ice  ?  ?      Orders Placed This Encounter   ? Referral to Podiatry   ?       ? Mucous cyst of digit of left hand 06/02/2018   ? Fibromyalgia 05/10/2018     Better sleep with flexeril       ? Rash, Intermittent, noted after colds 04/29/2018   ? Asymptomatic varicose veins 03/01/2018   ? Vitreous floater, bilateral 06/11/2017     No retinal detachment, retinal tear, or vitreous hemorrhage on dilated fundus exam. Signs and symptoms reviewed.     ? Increased PTH level 05/08/2017     Sees Dr. Selena Batten in endocrinology.      ? Syncope and collapse 06/25/2016     2018, ECG/Labs/MRI w/o gad unrevealing, saw neurology Dr. Lorina Rabon, plan; EEG, followup      ? Immune to measles 05/09/2016     (+) Ab 2018     ? Invasive ductal carcinoma of left breast, ER+PR+HER2- 01/29/2016     2017, Bilateral tomosynthesis screening mammogram on 12/11/2015. There were no suspicious findings seen. She had postsurgical changes in both breasts. Patient then developed bilateral breast pain, left greater than right. She was sent for bilateral diagnostic ultrasounds on 01/15/2016. The right breast was negative. However, in the left breast, there was an irregular mass seen with indistinct margins, measuring 5 x 6 x 4 mm, at 12 o'clock, 6 cm from the nipple. Her left axilla was negative. Genetic testing: NO MUTATION DETECTED/This panel tests three Founder Mutations in the BRCA1/2 genes (BRCA1 187delAG, BRCA1 5385insC, BRCA2 6174delT; lumpectomy: sentinal node negative x 2.  TAM recommended by oncology, annual endometrial surveillance given history by GYN.      ? Breast pain 01/09/2016     [facility] plan per NP, imaging, lifestyle changes     ? Cataract, nuclear sclerotic, both eyes 11/29/2015     Not yet visually significant. Continue observation.      ? Status post LASIK surgery of both eyes 11/29/2015     Performed by Dr. Ellin Goodie (2000 ?). Originally set with left eye for distance and low myopia for right.     ? Fibroid 04/09/2015     2017 on ultrasound.      ? BPPV (benign paroxysmal positional vertigo) 07/19/2013   ? Menopause 04/14/2013   ? Seborrheic dermatitis 05/11/2012   ? 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 PPD positive 04/07/2012   ? Palpitations 04/07/2012     2009 ER PACs, PVCs, sinus arrythmia, ECHO, Stress ECHO WNL 2009     ? OA (osteoarthritis) 04/07/2012     Hands 2011, anti CCP neg, no evidence inflammation     ? Visit for preventive health examination 04/07/2012     Colo noscopy= hyperplastic polyps 2017     ? Hearing loss, right 04/07/2012     Otosclerosis, s/p stapedectomy     ? Eye allergies 01/08/2012  Not having significant symptoms at this time, but could use over the counter anti-histamine eye drops, like ketotifen or artificial tears as needed if symptoms recur.     ? Osteoporosis 11/10/2011     DEXA 2013, spine-2.4, femoral neck -2.9. On HT for menopause sx and as osteoporosis rx.; PA lumbar spine T-score of -2.7, will begin TAM per oncology, spine -3.2 , referred to endocrinology. Saw Dr. Elmyra Ricks in endocrinology, plan: alendronate.  Patient declined bisphosphonate and other pharmacotherapy 2022.          Past Medical History:   Diagnosis Date   ? Anxiety    ? Breast cancer (HCC/RAF)    ? Cataract    ? Eczema    ? Fibroids    ? History of migraine headaches    ? History of radiation therapy 04/2016    L breast   ? OSA (obstructive sleep apnea) 11/07/2019   ? Otosclerosis    ? Post-operative nausea and vomiting    ? Rectal fissure    ? Vitreous detachment        Past Surgical History:   Procedure Laterality Date   ? BREAST BIOPSY  1986    Fibroadenoma, ductal hyperplasia   ? Laparoscopy for subfertility     ? Left finger cyst removal x 2     ? Stapedectomy, right

## 2021-05-30 ENCOUNTER — Ambulatory Visit: Payer: BC Managed Care – POS

## 2021-05-30 DIAGNOSIS — E213 Hyperparathyroidism, unspecified: Secondary | ICD-10-CM

## 2021-05-30 NOTE — H&P
PATIENT:  Robin Spencer  MRN:  [id number]  DOB:  1950-11-11  DATE OF SERVICE:  05/30/2021    PRIMARY CARE PROVIDER: Sulayman Manning, Weyman Croon., MD    Cc: Welcome to [employer] Initial Preventive Physical Examination OR Follow up Annual Wellness Visit Follow up and Follow up of Chronic Conditions  Subjective:     Robin Spencer is a a 71 y.o. female who presents for Initial Preventive Physical Examination or Annual Wellness Visit Follow up  and discussion and follow up of chronic conditions as listed below.    Note: This is a one time examination performed within 12 months of the first effective date of Medicare Part B coverage.  [id number]; or as a follow up 12 months after the last Annual Wellness Exam [phone], or in a person 35 years of age or older regardless of Medicare coverage.     1.  Increased PTH Level:  Monitoring calcium, stable.     2.  Hypervitaminosis D:  Now at target, 35.  Will continue current dose of Vitamin D.     3. Hearing loss:  New hearing aids at [facility].     4.  Osteoporosis/breast cancer:  On TAM.     5.  Vaginal atrophy: On Yuvafem.     6. OSA: Managed in [place].  On CPAP.     7.  Hordeolum: Improving with oral cefpodoxime.         Other MDs seen in continuity:  Dr.  Brooks Sailors (PCP, Ernestina Patches),  Dr. Chaney Malling (ophthalmologist, [facility]ina Patches),  Dr. Hortencia Pilar [facility]), Dr. Zoila Shutter (pulmonary/sleep apnea, [facility] Patches), Dr.  Maye Hides (Heme Onc- [place]),  Dr. Si Gaul (dermatology), Dr. Selena Batten (endocrinology), Dr. Michele Mcalpine (plastic surgeon, [name]).     Patient Active Problem List    Diagnosis Date Noted   ? Breast cancer (HCC/RAF)      2018, per Dr. Maye Hides: [name] and I discussed starting tamoxifen x5-10 years instead of an aromatase inhibitor for the following reasons: 1) she's on low-dose HRT (DHEA, which is converted to estrogen) for vaginal atrophy and 2) she has osteoporosis     ? Lymphadenopathy of head and neck 04/30/2021     2023, bilateral, noted in [place], follow up recommended.  ; follow up CT scan no adenopathy, report sent via [facility] 05/13/2021.      ? Abnormal RBC 04/30/2021     2023 outside lab read nucleated RBC= 1, follow up CBC ordered.      ? Hypervitaminosis D 09/28/2020     2022     ? Esophageal motility disorder 05/17/2020     2022:  Esophageal dysmotility:  Improved.  Bravo study not consistent with GERD.     ? History of COVID-19 05/17/2020     02/2020, dx'd in [place]     ? Vaginal atrophy 05/16/2020   ? Schatzki's ring 12/01/2019     On EGD 2021 By Dr. Cleda Clarks, Bravo placed.  No Barrett's esophagus.      ? Dyspepsia      2021 with sx, saw Dr. Donnamarie Poag, plan: PPI, will plan for EGD w/ Bravo to confirm or disprove reflux given atypical presentation of symptoms. EGD 2021 Dr. Cleda Clarks:  ESOPHAGUS, PROXIMAL, INLET PATCH (BIOPSY):  - Gastric oxyntic heteropia, consistent with inlet patch- No intestinal metaplasia, BRAVO placed. BRAVO:  Negative study for gastroesophageal reflux disease.  Consider workup of supragastric belching if clinically indicated..  Plan follow up Dr. Donnamarie Poag.     ? OSA (obstructive sleep apnea) 11/07/2019   ? Other  fracture of right great toe, initial encounter for closed fracture 05/06/2019     2021:  Patient presents with right big toe pain, swelling, and tenderness after trauma last night. X-ray completed during this visit confirms fracture of big toe.  - Reviewed x-ray findings and images with patient  - STAT referral to patient's podiatrist for ongoing management  - Provided post-op shoe with firm sole and crutches to patient, discussed importance of avoiding any bending of the toe  - Reviewed symptomatic care with Tylenol/Ibuprofen PRN, elevation, ice  ?  ?      Orders Placed This Encounter   ? Referral to Podiatry   ?       ? Mucous cyst of digit of left hand 06/02/2018   ? Fibromyalgia 05/10/2018     Better sleep with flexeril       ? Rash, Intermittent, noted after colds 04/29/2018   ? Asymptomatic varicose veins 03/01/2018   ? Vitreous floater, bilateral 06/11/2017     No retinal detachment, retinal tear, or vitreous hemorrhage on dilated fundus exam. Signs and symptoms reviewed.     ? Increased PTH level 05/08/2017     Sees Dr. Selena Batten in endocrinology.      ? Syncope and collapse 06/25/2016     2018, ECG/Labs/MRI w/o gad unrevealing, saw neurology Dr. Lorina Rabon, plan; EEG, followup      ? Immune to measles 05/09/2016     (+) Ab 2018     ? Invasive ductal carcinoma of left breast, ER+PR+HER2- 01/29/2016     2017, Bilateral tomosynthesis screening mammogram on 12/11/2015. There were no suspicious findings seen. She had postsurgical changes in both breasts. Patient then developed bilateral breast pain, left greater than right. She was sent for bilateral diagnostic ultrasounds on 01/15/2016. The right breast was negative. However, in the left breast, there was an irregular mass seen with indistinct margins, measuring 5 x 6 x 4 mm, at 12 o'clock, 6 cm from the nipple. Her left axilla was negative. Genetic testing: NO MUTATION DETECTED/This panel tests three Founder Mutations in the BRCA1/2 genes (BRCA1 187delAG, BRCA1 5385insC, BRCA2 6174delT; lumpectomy: sentinal node negative x 2.  TAM recommended by oncology, annual endometrial surveillance given history by GYN.      ? Breast pain 01/09/2016     [facility] plan per NP, imaging, lifestyle changes     ? Cataract, nuclear sclerotic, both eyes 11/29/2015     Not yet visually significant. Continue observation.      ? Status post LASIK surgery of both eyes 11/29/2015     Performed by Dr. Ellin Goodie (2000 ?). Originally set with left eye for distance and low myopia for right.     ? Fibroid 04/09/2015     2017 on ultrasound.      ? BPPV (benign paroxysmal positional vertigo) 07/19/2013   ? Menopause 04/14/2013   ? Seborrheic dermatitis 05/11/2012   ? 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 Palpitations 04/07/2012     2009 ER PACs, PVCs, sinus arrythmia, ECHO, Stress ECHO WNL 2009     ? OA (osteoarthritis) 04/07/2012     Hands 2011, anti CCP neg, no evidence inflammation     ? Visit for preventive health examination 04/07/2012     Colo noscopy= hyperplastic polyps 2017     ? Hearing loss, right 04/07/2012     Otosclerosis, s/p stapedectomy     ? Eye allergies 01/08/2012     Not having significant symptoms at this  time, but could use over the counter anti-histamine eye drops, like ketotifen or artificial tears as needed if symptoms recur.     ? Osteoporosis 11/10/2011     DEXA 2013, spine-2.4, femoral neck -2.9. On HT for menopause sx and as osteoporosis rx.; PA lumbar spine T-score of -2.7, will begin TAM per oncology, spine -3.2 , referred to endocrinology. Saw Dr. Elmyra Ricks in endocrinology, plan: alendronate.  Patient declined bisphosphonate and other pharmacotherapy 2022.            Allergies   Allergen Reactions   ? Penicillins Other (See Comments) and Rash     Ampicillin, flesh rash in 1980s   ? Menadiol Sodium Diphosphate Rash   ? Other      Red [place] coloring on meds such as advil- not allergic to actual med- just the coloring   ? Penicillin G        Current Outpatient Medications (GI Meds)   Medication Sig Dispense Refill   ? MAGNESIUM CITRATE PO* Take 150 mg by mouth two (2) times daily.     ? Probiotic Product (ALIGN) capsule      ? omeprazole 20 mg DR capsule TAKE 1 CAPSULE BY MOUTH EVERY DAY 30 MINUTES TO 1 HOUR BEFORE A MEAL (Patient not taking: Reported on 05/30/2021.)       Current Outpatient Medications (Nutrition)   Medication Sig Dispense Refill   ? Ascorbic Acid (VITAMIN C) 1000 MG tablet      ? calcium carbonate 1500 (600 Ca) mg tablet Take 600 mg by mouth daily.     ? Magnesium 250 MG TABS Take 250 mg by mouth daily.     ? MAGNESIUM CITRATE PO* Take 150 mg by mouth two (2) times daily.     ? Multiple Vitamins-Minerals (ALGAE BASED CALCIUM) TABS      ? Omega-3 Fatty Acids (FISH OIL PO) Take 1,400 mg by mouth two (2) times daily.     ? Zinc 50 MG TABS      ? BIOTIN PO Take 1 tablet by mouth daily .   (Patient not taking: Reported on 05/30/2021.) Current Outpatient Medications (GU Meds)   Medication Sig Dispense Refill   ? YUVAFEM 10 MCG vaginal tablet PLACE 1 TABLET VAGINALLY TWO TIMES A WEEK. 24 tablet 1     Current Outpatient Medications (Heme Meds)   Medication Sig Dispense Refill   ? cyanocobalamin 500 mcg tablet Take 1 tablet (500 mcg total) by mouth daily.       Current Outpatient Medications (Chemotherapy)   Medication Sig Dispense Refill   ? tamoxifen 20 mg tablet Take 1 tablet (20 mg total) by mouth daily. 90 tablet 3     Current Outpatient Medications (Antibiotics)   Medication Sig Dispense Refill   ? acyclovir 400 mg tablet Take 1 tablet (400 mg total) by mouth daily. 90 tablet 3   ? cefpodoxime 200 mg tablet Take 2 tablets (400 mg total) by mouth two (2) times daily for 13 doses. 26 tablet 0     Current Outpatient Medications (Derm Meds)   Medication Sig Dispense Refill   ? hydrocortisone 1% cream apply by topical route  every day to the affected area(s)     ? ketoconazole 2% shampoo APPLY TO AFFECTED AREA 3 TIMES A WEEK. LATHER, LEAVE ON FOR 5 MINUTES AND THEN RINSE WITH WATER     ? triamcinolone 0.5% cream Apply topically as needed for. 1 tube 1     Current Outpatient  Medications (Miscellaneous)   Medication Sig Dispense Refill   ? Homeopathic Products (TRAUMEEL) OINT Apply 4 g topically daily. 50 g 3     Current Outpatient Medications (Other)   Medication Sig Dispense Refill   ? UNABLE TO FIND Med Name: [place] supplement- Epimedium, Tumeric, Japanese, Fleeceflower, EPA/DHA .       * These medications belong to multiple therapeutic classes and are listed under each applicable group.         Past Medical History:   Diagnosis Date   ? Anxiety    ? Breast cancer (HCC/RAF)    ? Cataract    ? Eczema    ? Fibroids    ? History of migraine headaches    ? History of radiation therapy 04/2016    L breast   ? OSA (obstructive sleep apnea) 11/07/2019   ? Otosclerosis    ? Post-operative nausea and vomiting    ? Rectal fissure    ? Vitreous detachment        Past Surgical History:   Procedure Laterality Date   ? BREAST BIOPSY  1986    Fibroadenoma, ductal hyperplasia   ? Laparoscopy for subfertility     ? Left finger cyst removal x 2     ? Stapedectomy, right         Family History   Problem Relation Age of Onset   ? Hypertension Father    ? Stroke Father    ? Hyperlipidemia Father    ? Depression Father    ? Prostate cancer Father 24   ? Hypertension Mother    ? Asthma Mother    ? Endometrial cancer Mother 51   ? Hyperthyroidism Mother    ? Prostate cancer Brother 54   ? Breast cancer Neg Hx    ? Ovarian cancer Neg Hx    ? Malignant hypertension Neg Hx        Louellen Haldeman  reports that she has never smoked. She has never used smokeless tobacco. She reports that she does not drink alcohol and does not use drugs.  Lives with her husband, splits time between [place] and [place].  Disabled son moving back to [place]- has boyfriend/caregiver.     Opioid use: None     Lung Cancer Screening Assessment (age 51-80)    [  x ]  Less than 20 pack years tobacco history OR nonsmoker  [ x  ]  Quit smoking more than 15 years ago OR nonsmoker  [   ]  Lung cancer screening recommended and discussed  [  ]  Accepted  [  ]  Declined  [   ]  Lung cancer screening not discussed because of age/comorbidities    AAA Screening Assessment (once, age 90-75 years)  [ ]  Males between 7 and 74 years of age who smoked at least 100 cigarettes  [  ] Males or females with a family history of AAA  [  ] AAA screening not recommended because of lack of risk factors OR age/comorbidities    Diet:  Fruits, vegetables, lean protein.      Physical Activities, including limitations: Walking 2 miles a day and social dancing- not when raining.     Self assessment of health: Good.     Social support: Husband, friends.     Activities of Daily Living:       [  ] Needs assistance with:  [  ] bathing: personal hygiene and grooming; [  ]  dressing: dressing and undressing; [  ] transferring: movement and mobility; [  ] toileting: continence-related tasks including control and hygiene; [  ] eating: preparing food and feeding    [ x ] Independent in ADLs     Instrumental Activities of Daily Living :      [  ] Needs assistance with: [  ] cooking, [  ] cleaning, [  ] finances, [  ] shopping, [  ] healthcare and medication, [  ] using telephones and technology, [  ] transportation, and [  ] caring for other individuals and pets.    [ x ] Independent in IADLs     Fall risk:      (1) Falls in the past year?  Once  (2) Injury from fall in the past year? Yes, hit head.  Reviewed fall precautions.      [   ] Referred to physical therapy for gait training- declines.     Home Safety:      (1) Are you worried about clutter in your home? No  (2) Do you have poor lighting by day or night in your home? No  (3) Are there slippery surfaces in your home? No  (4) Are you worried about falling when using your toilet, bath or shower facilities? NO        Goals of Care/DPA: Declines to consider wishes, but would like to designate DPA. Forms provided.        Immunization History   Administered Date(s) Administered   ? COVID-19 [place] SARS-CoV-2 vaccine, unspecified 04/06/2019   ? COVID-19, mRNA, (Moderna) 100 mcg/0.5 mL 04/06/2019, 05/04/2019, 06/30/2019, 07/28/2019, 01/09/2020, 02/04/2020, 06/28/2020   ? Hepatitis A, Adult 12/10/2006, 04/24/2016   ? Hepatitis A, unspecified formulation 12/10/2006   ? Influenza vaccine IM quadrivalent (Afluria Quad) (PF) SYR (51 years of age and older) 12/22/2018, 12/29/2019   ? Tdap 04/26/2010, 01/24/2017, 12/22/2018   ? Typhoid Inactivated, ViCPs 04/24/2016   ? influenza vaccine IM cell culture quadrivalent (Flucelvax QUAD) (PF) SYR (43 months of age and older) 12/28/2020   ? influenza vaccine IM quadrivalent (Fluzone Quad) (PF) SYR/SDV (80 months of age and older) 02/14/2014   ? influenza vaccine IM quadrivalent adjuvanted (FluAD Quad) (PF) SYR (40 years of age and older) 12/20/2019   ? influenza vaccine IM quadrivalent high dose (Fluzone High Dose Quad) (PF) SYR (23 years of age and older) 12/08/2018   ? influenza vaccine IM trivalent adjuvanted (FluAD) (PF) SYR (64 years of age and older) 01/12/2018   ? influenza vaccine IM trivalent high dose (Fluzone High Dose) (PF) SYR (9 years of age and older) 01/16/2017   ? influenza, unspecified formulation 02/01/1999, 01/22/2000, 01/28/2002, 02/14/2014, 02/01/2016, 01/12/2018   ? pneumococcal conjugate vaccine 20-valent (Prevnar 20) 01/18/2021   ? pneumococcal polysaccharide vaccine 23-valent (Pneumovax) 05/18/2018   ? zoster live vaccine (Zostavax) 04/15/2012   ? zoster vac recomb adjuvanted (Shingrix) 06/26/2016, 06/02/2017         Health Maintenance   Topic Date Due   ? Advance Directive  Never done   ? Statin prescribed for ASCVD Prevention or Treatment  Never done   ? Annual Preventive Wellness Visit  05/17/2021   ? Breast Ca Screening: MAMMOGRAM  02/18/2022   ? Colorectal Cancer Screening  10/07/2025   ? Tdap/Td Vaccine (4 - Td or Tdap) 12/21/2028   ? Hepatitis B Screening  Completed   ? Influenza Vaccine  Completed   ? Shingles (Zostavax) Vaccine  Completed   ?  Pneumococcal Vaccine  Completed   ? Osteoporosis Early Detection DEXA Scan  Completed   ? Hepatitis C Screening  Completed   ? Shingles (Shingrix) Vaccine  Completed   ? COVID-19 Vaccine(Tracks primary and booster doses, not sup/immunocomp)  Completed   ? Cervical Ca Screening: HPV Testing  Discontinued   ? Cervical Ca Screening: PAP Smear  Discontinued         Review of Systems:     Depression screen:    (1) During the past month, have you been bothered by feeling down, depressed or helpless?  No  (2) During the past month, have you often been bothered by little interest or pleasure in doing things? No    Hearing: Hearing aids.     Vision: Regular eye visits.     Const:  10 lb weight gain.   Cor:  No exertional chest pain.  Pulm:  No exertional dyspnea.  Endo:  Postmenopausal           Objective:      BP 129/77 (BP Location: Right arm, Patient Position: Sitting, Cuff Size: Regular)  ~ Pulse 88  ~ Temp 36.2 ?C (97.2 ?F) (Forehead)  ~ LMP  (LMP Unknown)  ~ SpO2 98%   There is no height or weight on file to calculate BMI.     Per patient weight 134 lbs, height 5 ft 4 inches.          System Check if normal Positive or additional negative findings   Constit  [x]  General appearance     Eyes  []  Conj/Lids []  Pupils  []  Fundi     HENMT  []  External ears/nose []  Otoscopy   []  Gross Hearing []  Nasal mucosa   []  Lips/teeth/gums []  Oropharynx    []  mucus membranes []  Head     Neck  [x]  Inspection/palpation [x]  Thyroid  < 1 cm left neck LN   Resp  [x]  Effort []  Wheezing    [x]  Auscultation  []  Crackles     CV  [x]  Rhythm/rate   [x]  Murmurs   []  LEE   []  JVP non-elevated    Normal pulses:   []  Radial []  Femoral  []  Pedal     Breast  [x]  Inspection [x]  Palpation  S/p left breast lumpectomy   GI  [x]  abd masses    [x]  tenderness   []  rebound/guarding   []  Liver/spleen []  Rectal     GU  M: []  Scrotum []  Penis []  Prostate   F:  []  External []  vaginal wall        []  Cervix  []  mucus        []  Uterus    []  Adnexa      Lymph  []  Neck [x]  Axillae []  Groin     MSK Specify site examined:    []  Inspect/palp []  ROM   []  Stability []  Strength/tone         Skin  []  Inspection []  Palpation     Neuro  []  CN2-12 intact grossly   []  Alert and oriented   []  DTR      []  Muscle strength      []  Sensation   []  Gait/balance     Psych  [x]  Insight/judgement     [x]  Mood/affect    [x]  Gross cognition        Patient informed of chaperone program: Patient Declined          LABS:  Lab Results   Component  Value Date    WBC 4.96 05/27/2021    HGB 13.5 05/27/2021    HCT 37.1 05/27/2021    MCV 98.1 05/27/2021    PLT 197 05/27/2021     Lab Results   Component Value Date    CREAT 0.74 05/27/2021    BUN 15 05/27/2021    NA 142 05/27/2021    K 4.5 05/27/2021    CL 105 05/27/2021    CO2 27 05/27/2021     Lab Results   Component Value Date    ALT 21 05/27/2021 AST 23 05/27/2021    ALKPHOS 69 05/27/2021    BILITOT 0.6 05/27/2021     Lab Results   Component Value Date    CHOL 194 05/27/2021    CHOLDLCAL 144 (H) 04/20/2014    TRIGLY 99 04/20/2014     Lab Results   Component Value Date    TSH 1.5 02/21/2021     Lab Results   Component Value Date    HGBA1C 5.5 02/21/2021           Last Vitamin D level:   Results for orders placed or performed in visit on 02/21/21   Vitamin D,25-Hydroxy   Result Value Ref Range    Vitamin D,25-Hydroxy 35 20 - 50 ng/mL        [place] left breast, screening    Result Date: 02/20/2021  There is no mammographic or sonographic evidence of malignancy.  Routine screening mammogram in 1 year is recommended.  The patient has a history of breast cancer. The Tyrer-Cuzick risk model does not apply.  BI-RADS Category 2: Benign Finding(s)      Report Electronically Signed by: Vale Haven 02/20/2021  Technologist: Hermina Barters                                                            [place] right breast, screening    Result Date: 02/20/2021  There is no mammographic or sonographic evidence of malignancy.  Routine screening mammogram in 1 year is recommended.  The patient has a history of breast cancer. The Tyrer-Cuzick risk model does not apply.  BI-RADS Category 2: Benign Finding(s)      Report Electronically Signed by: Vale Haven 02/20/2021  Technologist: Hermina Barters                                                            Mammo tomosynthesis, screening, bilat breast    Result Date: 02/20/2021  There is no mammographic or sonographic evidence of malignancy.  Routine screening mammogram in 1 year is recommended.  The patient has a history of breast cancer. The Tyrer-Cuzick risk model does not apply.  BI-RADS Category 2: Benign Finding(s)      Report Electronically Signed by: Vale Haven 02/20/2021  Technologist: Hermina Barters  Assessment & Plan:     Assessment:   1.  Increased PTH Level:  Monitoring calcium, stable.     2.  Hypervitaminosis D:  Now at target, 35.  Will continue current dose of Vitamin D.     3. Hearing loss:  New hearing aids at [facility].     4.  Osteoporosis/breast cancer:  On TAM.     5.  Vaginal atrophy: On Yuvafem.     6. OSA: Managed in [place].  On CPAP.     7.  Hordeolum: Improving with oral cefpodoxime.       Annual Preventive Exam  Patient Active Problem List   Diagnosis   ? Osteoporosis   ? Eye allergies   ? Genital HSV   ? Palpitations   ? OA (osteoarthritis)   ? Visit for preventive health examination   ? Hearing loss, right   ? Seborrheic dermatitis   ? Menopause   ? BPPV (benign paroxysmal positional vertigo)   ? Fibroid   ? Cataract, nuclear sclerotic, both eyes   ? Status post LASIK surgery of both eyes   ? Breast pain   ? Invasive ductal carcinoma of left breast, ER+PR+HER2-   ? Immune to measles   ? Syncope and collapse   ? Increased PTH level   ? Vitreous floater, bilateral   ? Asymptomatic varicose veins   ? Rash, Intermittent, noted after colds   ? Fibromyalgia   ? Mucous cyst of digit of left hand   ? Other fracture of right great toe, initial encounter for closed fracture   ? OSA (obstructive sleep apnea)   ? Dyspepsia   ? Schatzki's ring   ? Vaginal atrophy   ? Esophageal motility disorder   ? History of COVID-19   ? Hypervitaminosis D   ? Lymphadenopathy of head and neck   ? Abnormal RBC   ? Breast cancer (HCC/RAF)     Health Maintenance   Topic Date Due   ? Advance Directive  Never done   ? Statin prescribed for ASCVD Prevention or Treatment  Never done   ? Annual Preventive Wellness Visit  05/17/2021   ? Breast Ca Screening: MAMMOGRAM  02/18/2022   ? Colorectal Cancer Screening  10/07/2025   ? Tdap/Td Vaccine (4 - Td or Tdap) 12/21/2028   ? Hepatitis B Screening  Completed   ? Influenza Vaccine  Completed   ? Shingles (Zostavax) Vaccine  Completed   ? Pneumococcal Vaccine  Completed   ? Osteoporosis Early Detection DEXA Scan  Completed   ? Hepatitis C Screening Completed   ? Shingles (Shingrix) Vaccine  Completed   ? COVID-19 Vaccine(Tracks primary and booster doses, not sup/immunocomp)  Completed   ? Cervical Ca Screening: HPV Testing  Discontinued   ? Cervical Ca Screening: PAP Smear  Discontinued         Personalized Prevention Plan Services and  Management of Medical Conditions:     Health Maintenance   Topic Date Due   ? Advance Directive  Never done   ? Statin prescribed for ASCVD Prevention or Treatment  Never done   ? Annual Preventive Wellness Visit  05/17/2021   ? Breast Ca Screening: MAMMOGRAM  02/18/2022   ? Colorectal Cancer Screening  10/07/2025   ? Tdap/Td Vaccine (4 - Td or Tdap) 12/21/2028   ? Hepatitis B Screening  Completed   ? Influenza Vaccine  Completed   ? Shingles (Zostavax) Vaccine  Completed   ? Pneumococcal Vaccine  Completed   ? Osteoporosis Early Detection DEXA Scan  Completed   ? Hepatitis C Screening  Completed   ? Shingles (Shingrix) Vaccine  Completed   ? COVID-19 Vaccine(Tracks primary and booster doses, not sup/immunocomp)  Completed   ? Cervical Ca Screening: HPV Testing  Discontinued   ? Cervical Ca Screening: PAP Smear  Discontinued     Orders Placed This Encounter   ? Ascorbic Acid (VITAMIN C) 1000 MG tablet   ? hydrocortisone 1% cream   ? ketoconazole 2% shampoo   ? DISCONTD: losartan 100 mg tablet   ? Multiple Vitamins-Minerals (ALGAE BASED CALCIUM) TABS   ? Probiotic Product (ALIGN) capsule   ? omeprazole 20 mg DR capsule   ? Zinc 50 MG TABS     Personalized Health Advice (if applicable, includes referrals to health education or preventive counseling services or programs):      Patient Instructions   Please send a copy of your Durable Power of Attorney/Advanced Heathcare Planning Form to our office so we can put it in our computer system.  This will make it available to all your physicians, the Emergency Department, and our hospitals, if needed.      To avoid loss of muscle mass, aim to eat 50-75 grams of protein daily.  Many women find they need to use a protein supplement to achieve this.  If you need to limit calories, look for protein bars or drinks with 250 calories or less for 20 grams of protein.  Sources of protein in these are often whey(milk), pea, hemp, or soy.  You should select based on your personal choice on this.  If you need to gain weight, look for higher calorie supplements.     Here's how many grams of protein are in these foods:    1/2 cup low-fat cottage cheese: 14g    3 ounces tofu, firm: 9g    1/2 cup cooked lentils: 9g    2 tablespoons natural-style peanut butter (7g) or almond butter (6.7g)    3 oz skinless chicken breast: 26g    3 oz fish fillet (depending on type of fish): 17-20g    1 ounce provolone cheese: 7g    1/2 cup cooked kidney beans: 7.7g    1 ounce almonds: 6g    1 large egg: 6g    4 ounces low-fat plain yogurt: 6g    4 ounces soy milk: 5g    4 ounces low-fat milk: 4g     Return in about 1 year (around 05/31/2022) for Annual Wellness- 30 minutes, Please give Advance Care Planning Form.        The above plan of care, diagnosis, order, and follow-up were discussed with the patient. Questions related to this recommended plan of care were answered.    Verbal or written information was provided to the patient about end of life planning/advanced care planning.     The services and diagnoses listed above in addition to the Medicare Annual Visit constituted separate and distinct services and diagnoses that were addressed and discussed with the patient today.  Author:  Weyman Croon. Mykiah Schmuck 05/30/2021 10:06 AM

## 2021-06-15 MED ORDER — YUVAFEM 10 MCG VA TABS
ORAL_TABLET | 1 refills
Start: 2021-06-15 — End: ?

## 2021-06-18 MED ORDER — YUVAFEM 10 MCG VA TABS
ORAL_TABLET | 3 refills | Status: AC
Start: 2021-06-18 — End: ?

## 2021-06-25 ENCOUNTER — Telehealth: Payer: BLUE CROSS/BLUE SHIELD

## 2021-06-25 ENCOUNTER — Telehealth: Payer: BC Managed Care – POS

## 2021-06-25 NOTE — Telephone Encounter
Rip Harbour called from [employer] call back number [phone], transfer to [facility] team reference prior auth number [id number]. Yuvafem micro gram vaginal insert 10 mico gram not prior auth for med but tier exception if approved reduces copay. Requesting directions for use, diagnosis, other medications tried and failed, lower tiered medicationa used and if effect or not. Requesting to speak with someone who can answer clinical questions.

## 2021-06-25 NOTE — Telephone Encounter
Dx= Dyspareunia and vaginal atrophy, breast cancer on Tamoxifen  Rx= Failed OTC lubricants and DHEA, Vagifem recommened by oncologist as low ddose.     JP

## 2021-06-25 NOTE — Telephone Encounter
Call initiated at: 06/25/2021 ~ 2:30 PM     [place] at [phone], RE: Tier Exception   Reference number: [id number]   Medication: Yuvafem 83mg vaginal tablet - place 1 tablet vaginally 2 times a week   Diagnosis per MD: Dyspareunia and vaginal atrophy, breast cancer on Tamoxifen     Call picked up by JMartinique per JMartiniqueneeds to be transferred pharmacy department, spoke with ABryson Ha  Per ABryson Haneeds to be transferred to [facility]  2:53 PM - [facility]  Request will be in process, will have result at about Thursday morning (06/27/2021)    MAnegam L[place](Vita Barley   06/25/2021 ~ 2:59 PM         Pregler, JRanae Pila, MD 1 hour ago (1:07 PM)     Dx= Dyspareunia and vaginal atrophy, breast cancer on Tamoxifen  Rx= Failed OTC lubricants and DHEA, Vagifem recommened by oncologist as low ddose.         JWynetta Emery SQuinlan3 hours ago (11:19 AM)     MRip Harbourcalled from [employer]call back number [phone] [facility] number [id number] Yuvafem micro gram vaginal insert 10 mico gram not prior auth for med but tier exception if approved reduces copay. Requesting directions for use, diagnosis, other medications tried and failed, lower tiered medicationa used and if effect or not. Requesting to speak with someone who can answer clinical questions.

## 2021-06-25 NOTE — Telephone Encounter
PDL Call to Clinic    Reason for Call: Representative, Rip Harbour, wants to speak with someone who can help with an authorization for the patient.    Call back number: [phone] - and to request to transfer to [facility] Team  Reference number: [id number]    Appointment Related?  '[]'$  Yes  '[x]'$  No     If yes;  Date:  Time:    Call warm transferred to PDL: '[x]'$  Yes  '[]'$  No    Call Received by Clinic Representative: Satina    If call not answered/not accepted, call received by Patient Services Representative:

## 2021-08-12 ENCOUNTER — Ambulatory Visit: Payer: BLUE CROSS/BLUE SHIELD

## 2021-08-14 ENCOUNTER — Telehealth: Payer: BLUE CROSS/BLUE SHIELD

## 2021-08-14 NOTE — Telephone Encounter
Pt says she cannot come in 6/1 due to being out of town, offered pt several appts but she declined and says she will wait for her appt. on 7/3 to discuss her symptomns.

## 2021-08-14 NOTE — Telephone Encounter
Message to Practice/Provider      Message: Pt has scheduled an appt for 08/15/2021 video visit  but she will be out of town in [place] would like to inform office   Please advise if OK    Return call is not being requested by the patient or caller.    Patient or caller has been notified of the turnaround time of 1-2 business day(s).

## 2021-08-15 ENCOUNTER — Telehealth: Payer: BC Managed Care – POS

## 2021-08-15 ENCOUNTER — Ambulatory Visit: Payer: BC Managed Care – POS

## 2021-08-15 ENCOUNTER — Telehealth: Payer: BLUE CROSS/BLUE SHIELD

## 2021-08-15 DIAGNOSIS — R5383 Other fatigue: Secondary | ICD-10-CM

## 2021-08-15 DIAGNOSIS — C50919 Malignant neoplasm of unspecified site of unspecified female breast: Secondary | ICD-10-CM

## 2021-08-15 NOTE — Progress Notes
PATIENT: Robin Spencer  MRN: [id number]  DOB: November 20, 1950  DATE OF SERVICE: 08/15/2021    CHIEF COMPLAINT:   Chief Complaint   Patient presents with   ? Fatigue   ? Pain     3/10        Patient Consent to Telehealth Questionnaire       09/30/2020     4:27 PM   [facility] TELEHEALTH PRECHECKIN QUESTIONS   By clicking ''I Agree'', I consent to the below:  I Agree     - I agree  to be treated via a video visit and acknowledge that I may be liable for any relevant copays or coinsurance depending on my insurance plan.  - I understand that this video visit is offered for my convenience and I am able to cancel and reschedule for an in-person appointment if I desire.  - I also acknowledge that sensitive medical information may be discussed during this video visit appointment and that it is my responsibility to locate myself in a location that ensures privacy to my own level of comfort.  - I also acknowledge that I should not be participating in a video visit in a way that could cause danger to myself or to those around me (such as driving or walking).  If my provider is concerned about my safety, I understand that they have the right to terminate the visit.  Video fail, visit conducted by telephone.     HPI   Robin Spencer is a 71 y.o. female presents for   Chief Complaint   Patient presents with   ? Fatigue   ? Pain     3/10       (1) Fatigue/OSA/FMS/History of breast cancer: Patient wakes up feeling fatigued, improves with activity, but fatigue returns late morning to mid-day.  While driving in [place] feels fatigued.  Unable to stand persistently at dance lesson.  Not pain, not shortness of breath, not dizziness.     Goes to bed between 10 pm and midnight, takes longer to fall asleep but sleeps well, does not get up at night, wakes up after 7 or more hours.      Uses CPAP machine: Few events, mask seal per machine is good.     Exercise:  Dancing 3 times a week- feels OK doing this.  Walks the dog daily, spends 20 minutes.   Very rare alcohol.     MEDS     Medications that the patient states to be currently taking   Medication Sig   ? acyclovir 400 mg tablet Take 1 tablet (400 mg total) by mouth daily.   ? Ascorbic Acid (VITAMIN C) 1000 MG tablet    ? calcium carbonate 1500 (600 Ca) mg tablet Take 600 mg by mouth daily.   ? cyanocobalamin 500 mcg tablet Take 1 tablet (500 mcg total) by mouth daily.   ? Homeopathic Products (TRAUMEEL) OINT Apply 4 g topically daily.   ? ketoconazole 2% shampoo APPLY TO AFFECTED AREA 3 TIMES A WEEK. LATHER, LEAVE ON FOR 5 MINUTES AND THEN RINSE WITH WATER   ? Magnesium 250 MG TABS Take 250 mg by mouth daily.   ? MAGNESIUM CITRATE PO Take 150 mg by mouth two (2) times daily.   ? Multiple Vitamins-Minerals (ALGAE BASED CALCIUM) TABS    ? Omega-3 Fatty Acids (FISH OIL PO) Take 1,400 mg by mouth two (2) times daily.   ? Probiotic Product (ALIGN) capsule    ? tamoxifen 20  mg tablet Take 1 tablet (20 mg total) by mouth daily.   ? triamcinolone 0.5% cream Apply topically as needed for.   ? UNABLE TO FIND Med Name: Promedley supplement- Epimedium, Tumeric, Japanese, Fleeceflower, EPA/DHA .   ? YUVAFEM 10 MCG vaginal tablet PLACE 1 TABLET VAGINALLY TWO TIMES A WEEK.   ? Zinc 50 MG TABS          Patient Active Problem List   Diagnosis   ? Osteoporosis   ? Eye allergies   ? Genital HSV   ? Palpitations   ? OA (osteoarthritis)   ? Visit for preventive health examination   ? Hearing loss, right   ? Seborrheic dermatitis   ? Menopause   ? BPPV (benign paroxysmal positional vertigo)   ? Fibroid   ? Cataract, nuclear sclerotic, both eyes   ? Status post LASIK surgery of both eyes   ? Breast pain   ? Invasive ductal carcinoma of left breast, ER+PR+HER2-   ? Immune to measles   ? Syncope and collapse   ? Increased PTH level   ? Vitreous floater, bilateral   ? Asymptomatic varicose veins   ? Rash, Intermittent, noted after colds   ? Fibromyalgia   ? Mucous cyst of digit of left hand   ? Other fracture of right great toe, initial encounter for closed fracture   ? OSA (obstructive sleep apnea)   ? Dyspepsia   ? Schatzki's ring   ? Vaginal atrophy   ? Esophageal motility disorder   ? History of COVID-19   ? Hypervitaminosis D   ? Lymphadenopathy of head and neck   ? Abnormal RBC   ? Breast cancer (HCC/RAF)   ? Other fatigue       PHYSICAL EXAM    There were no vitals filed for this visit.  There is no height or weight on file to calculate BMI.    System Check if normal Positive or additional negative findings   Constit  []  General appearance     Eyes  []  Conj/Lids []  Pupils  []  Fundi     HENMT  []  External ears/nose []  Otoscopy   []  Gross Hearing []  Nasal mucosa   []  Lips/teeth/gums []  Oropharynx    []  mucus membranes []  Head     Neck  []  Inspection/palpation []  Thyroid     Resp  [x]  Effort []  Wheezing    []  Auscultation  []  Crackles     CV  []  Rhythm/rate   []  Murmurs   []  LEE   []  JVP non-elevated    Normal pulses:   []  Radial []  Femoral  []  Pedal     Breast  []  Inspection []  Palpation     GI  []  abd masses    []  tenderness   []  rebound/guarding   []  Liver/spleen []  Rectal     GU  M: []  Scrotum []  Penis []  Prostate   F:  []  External []  vaginal wall        []  Cervix  []  mucus        []  Uterus    []  Adnexa      Lymph  []  Neck []  Axillae []  Groin     MSK Specify site examined:    []  Inspect/palp []  ROM   []  Stability []  Strength/tone         Skin  []  Inspection []  Palpation     Neuro  []  CN2-12 intact grossly   []  Alert and  oriented   []  DTR      []  Muscle strength      []  Sensation   []  Gait/balance     Psych  [x]  Insight/judgement     [x]  Mood/affect    [x]  Gross cognition        LABS/STUDIES   I have:   [x]  Reviewed/ordered []  1 []  2 [x]  ? 3 unique laboratory, radiology, and/or diagnostic tests noted below    []  Reviewed []  1 []  2 []  ? 3 prior external notes and incorporated into patient assessment    []  Discussed management or test interpretation with external provider(s) as noted       Lab Studies:    Lab Results   Component Value Date HCT 37.1 05/27/2021    HGB 13.5 05/27/2021    MCV 98.1 05/27/2021    PLT 197 05/27/2021    WBC 4.96 05/27/2021     Lab Results   Component Value Date    BUN 15 05/27/2021    CL 105 05/27/2021    CO2 27 05/27/2021    CREAT 0.74 05/27/2021    K 4.5 05/27/2021    NA 142 05/27/2021     Lab Results   Component Value Date    ALKPHOS 69 05/27/2021    ALT 21 05/27/2021    AST 23 05/27/2021    BILITOT 0.6 05/27/2021     Lab Results   Component Value Date    CHOL 194 05/27/2021    CHOLDLCAL 144 (H) 04/20/2014    CHOLHDL 75 05/27/2021    TRIGLY 99 04/20/2014     Lab Results   Component Value Date    TSH 1.5 02/21/2021     Lab Results   Component Value Date    HGBA1C 5.5 02/21/2021       Imaging Studies:     Mammo tomosynthesis, screening, bilat breast    Result Date: 02/20/2021  There is no mammographic or sonographic evidence of malignancy.  Routine screening mammogram in 1 year is recommended.  The patient has a history of breast cancer. The Tyrer-Cuzick risk model does not apply.  BI-RADS Category 2: Benign Finding(s)      Report Electronically Signed by: Vale Haven 02/20/2021  Technologist: Hermina Barters                                                            [place] right breast, screening    Result Date: 02/20/2021  There is no mammographic or sonographic evidence of malignancy.  Routine screening mammogram in 1 year is recommended.  The patient has a history of breast cancer. The Tyrer-Cuzick risk model does not apply.  BI-RADS Category 2: Benign Finding(s)      Report Electronically Signed by: Vale Haven 02/20/2021  Technologist: Hermina Barters                                                            [place] left breast, screening    Result Date: 02/20/2021  There is no mammographic or sonographic evidence of malignancy.  Routine screening mammogram in 1 year is recommended.  The patient has a history of breast cancer. The Tyrer-Cuzick risk model does not apply.  BI-RADS Category 2: Benign Finding(s)      Report Electronically Signed by: Vale Haven 02/20/2021  Technologist: Hermina Barters                                                              A&P   Robin Spencer is a a 71 y.o. female presenting for   Chief Complaint   Patient presents with   ? Fatigue   ? Pain     3/10         PROBLEM & ORDERS    ICD-10-CM    1. OSA (obstructive sleep apnea)  G47.33       2. Other fatigue  R53.83 Comprehensive Metabolic Panel     CBC & Auto Differential     TSH     CA27.29      3. Fibromyalgia  M79.7       4. Malignant neoplasm of female breast, unspecified estrogen receptor status, unspecified laterality, unspecified site of breast (HCC/RAF)  C50.919 Comprehensive Metabolic Panel     CBC & Auto Differential     CA27.29          ASSESSMENT    Fatigue characterized by non-refreshing sleep and later daytime fatigue in patient with history of OSA/FMS/Breast cancer.  Will check labs.  Patient agrees to follow up with her pulmonologist to check CPAP machine and efficacy.     Orders Placed This Encounter   ? Comprehensive Metabolic Panel   ? CBC & Auto Differential   ? TSH   ? CA27.29     Patient Instructions   Consider BIVALENT COVID vaccination (available only since September 2022) , this can present hospitalization, death, and long term problems like loss of smell, dizziness, and shortness of breath.  You can get COVID vaccine at the [facility] on the first floor of the [address], or at other pharmacies.       The above recommendation were discussed with the patient.  The patient has all questions answered satisfactorily and is in agreement with this recommended plan of care.    Return in 1 month (on 09/16/2021).     Author:  Weyman Croon. La Shehan 08/15/2021 11:51 AM     32 minutes were spent personally by me today on this encounter which include today's pre-visit review of the chart, obtaining appropriate history, performing an evaluation, documentation and discussion of management with details supported within the note for today's visit. The time documented was exclusive of any time spent on the separately billed procedure.

## 2021-08-15 NOTE — Telephone Encounter
Call Back Request      Reason for call back: Per patient, was speaking with Dr. Lysbeth Penner, call was disconnected. Would like Dr. Lysbeth Penner to give her a call back today. Per patient, they are traveling to [place] and driving through [place] and passes and may not have cell service.    Any Symptoms:  '[]'$  Yes  '[x]'$  No       If yes, what symptoms are you experiencing:    o Duration of symptoms (how long):    o Have you taken medication for symptoms (OTC or Rx):      If call was taken outside of clinic hours:    '[]'$ Patient or caller has been notified that this message was sent outside of normal clinic hours.     '[]'$ Patient or caller has been warm transferred to the physician's answering service. If applicable, patient or caller informed to please call us back if symptoms progress.  Patient or caller has been notified of the turnaround time of 1-2 business day(s).

## 2021-08-15 NOTE — Nursing Note
Nursing Note: Video Visit Rooming    Patient: Robin Spencer MRN: [id number] DOB: [birth year] AGE: 71 y.o.  Date of service: 08/15/2021  Provider: Weyman Croon. Pregler, MD    ? Rooming time started: 8:01 AM  ? Called Robin Spencer at [phone] to ensure that she is ready for today's video visit appoinment.   ? Visit info, vital signs (only if patient has BP machine / pulse oximeter / thermometer), allergies, medications, refills, pharmacy have been entered/verified in CC.  ? Patient was instructed to join the Video Visit 5-10 minutes before the appointment.  ? Patient verbalized understanding of given information  ? MyChart Support Number: [phone] provided for technical issues.          Patient informed of chaperone program: Trained chaperone not required for exam/procedure    Chief Complaint   Patient presents with   ? Fatigue   ? Pain     3/10     Vital signs taken by patient:   There were no vitals filed for this visit.  Pain Information (Last Filed)     Score Location Comments Edu?      3 None None None         Allergies:   Allergies as of 08/15/2021 - Review Complete 08/15/2021   Allergen Reaction Noted   ? Penicillins Other (See Comments) and Rash 01/08/2012   ? Menadiol sodium diphosphate Rash 03/19/2016   ? Other  02/29/2016   ? Penicillin g  10/05/2019     Medication Reconciliation:   Medications that the patient states to be currently taking   Medication Sig   ? acyclovir 400 mg tablet Take 1 tablet (400 mg total) by mouth daily.   ? Ascorbic Acid (VITAMIN C) 1000 MG tablet    ? calcium carbonate 1500 (600 Ca) mg tablet Take 600 mg by mouth daily.   ? cyanocobalamin 500 mcg tablet Take 1 tablet (500 mcg total) by mouth daily.   ? Homeopathic Products (TRAUMEEL) OINT Apply 4 g topically daily.   ? ketoconazole 2% shampoo APPLY TO AFFECTED AREA 3 TIMES A WEEK. LATHER, LEAVE ON FOR 5 MINUTES AND THEN RINSE WITH WATER   ? Magnesium 250 MG TABS Take 250 mg by mouth daily.   ? MAGNESIUM CITRATE PO Take 150 mg by mouth two (2) times daily.   ? Multiple Vitamins-Minerals (ALGAE BASED CALCIUM) TABS    ? Omega-3 Fatty Acids (FISH OIL PO) Take 1,400 mg by mouth two (2) times daily.   ? Probiotic Product (ALIGN) capsule    ? tamoxifen 20 mg tablet Take 1 tablet (20 mg total) by mouth daily.   ? triamcinolone 0.5% cream Apply topically as needed for.   ? UNABLE TO FIND Med Name: Promedley supplement- Epimedium, Tumeric, Japanese, Fleeceflower, EPA/DHA .   ? YUVAFEM 10 MCG vaginal tablet PLACE 1 TABLET VAGINALLY TWO TIMES A WEEK.   ? Zinc 50 MG TABS      [name], [place] ([name])   08/15/2021 ~ 8:03 AM

## 2021-08-15 NOTE — Patient Instructions
(  1) See your pulmonologist to determine if your sleep apnea treatment is optimized.   (2) Have blood work.

## 2021-08-18 ENCOUNTER — Ambulatory Visit: Payer: BLUE CROSS/BLUE SHIELD

## 2021-08-20 ENCOUNTER — Telehealth: Payer: BLUE CROSS/BLUE SHIELD

## 2021-08-20 ENCOUNTER — Telehealth: Payer: BC Managed Care – POS

## 2021-08-20 NOTE — Telephone Encounter
Ldm advising ot that her appt had been rescheduled to 09/16/21 at 3:20 pm. Requested a call back to see if she wanted to do the appt in person or video.

## 2021-08-20 NOTE — Telephone Encounter
Appointment Accommodation Request      Appointment Type: follow up    Reason for sooner request: The patient lives out of the area and wanted to schedule a in person appointment while in [place] on 09/16/21.  Please advise.     Date/Time Requested (If any): 09/16/21    Last seen by MD: 07/27/20 video    Any Symptoms:  '[]'$  Yes  '[x]'$  No       If yes, what symptoms are you experiencing:   o Duration of symptoms (how long):     Patient or caller was offered an appointment but declined.    Patient or caller was advised to seek emergency services if conditions are urgent or emergent.    Patient or caller has been notified of the turnaround time of 1-2 business (days).

## 2021-08-20 NOTE — Telephone Encounter
Scheduled and confirmed for 7/3

## 2021-08-20 NOTE — Telephone Encounter
Message to Practice/Provider      Message: Pt confirmed appt on 7/3 at 3:20 for in office.     Return call is not being requested by the patient or caller.    Patient or caller has been notified of the turnaround time of 1-2 business day(s).

## 2021-08-20 NOTE — Telephone Encounter
Appointment Accommodation Request      Appointment Type: Follow up     Reason for sooner request: Per patient is coming in from [place] on July 3rd to see primary care doctor, would like to see if Dr. Lovena Le would be able to see her that same day.     Date/Time Requested (If any): 07/03 any time after 9:30 am     Last seen by MD: 05/17/20    Any Symptoms:  '[]'$  Yes  '[x]'$  No       If yes, what symptoms are you experiencing:   o Duration of symptoms (how long):     Patient or caller was offered an appointment but declined.    Patient or caller was advised to seek emergency services if conditions are urgent or emergent.    Patient or caller has been notified of the turnaround time of 1-2 business (days).

## 2021-08-22 ENCOUNTER — Ambulatory Visit: Payer: BC Managed Care – POS

## 2021-08-24 ENCOUNTER — Ambulatory Visit: Payer: BC Managed Care – POS

## 2021-08-26 MED ORDER — ACYCLOVIR 400 MG PO TABS
400 mg | ORAL_TABLET | Freq: Every day | ORAL | 3 refills | 30.00000 days
Start: 2021-08-26 — End: ?

## 2021-08-27 MED ORDER — ACYCLOVIR 400 MG PO TABS
400 mg | ORAL_TABLET | Freq: Every day | ORAL | 3 refills
Start: 2021-08-27 — End: ?

## 2021-08-27 MED ORDER — ACYCLOVIR 400 MG PO TABS
400 mg | ORAL_TABLET | Freq: Two times a day (BID) | ORAL | 3 refills | Status: AC
Start: 2021-08-27 — End: ?

## 2021-08-27 NOTE — Telephone Encounter
Dr. Lysbeth Penner,     Pt is requesting refills for acyclovir 400 mg BID (previous dosing).  Most recent rx approved for once daily dosing. Medication not addressed in your recent notes.   Updated active order to reflect pt's request. Deferring to you for review. Thank you.

## 2021-09-02 ENCOUNTER — Ambulatory Visit: Payer: BC Managed Care – POS

## 2021-09-04 DIAGNOSIS — M810 Age-related osteoporosis without current pathological fracture: Secondary | ICD-10-CM

## 2021-09-09 MED ORDER — TAMOXIFEN CITRATE 20 MG PO TABS
ORAL_TABLET | ORAL | 1 refills | 60.00000 days
Start: 2021-09-09 — End: ?

## 2021-09-11 MED ORDER — TAMOXIFEN CITRATE 20 MG PO TABS
ORAL_TABLET | ORAL | 1 refills | 60.00000 days
Start: 2021-09-11 — End: ?

## 2021-09-12 NOTE — Progress Notes
BREAST FOLLOW-UP OFFICE VISIT NOTE    ?PATIENT: Robin Spencer  ?MRN: [id number]  ?DOB: 02/27/1951    ?DATE OF SERVICE: 09/16/2021    ?REFERRING PRACTITIONER: Cyril Loosen., MD  ?PRIMARY CARE PROVIDER: Pregler, Weyman Croon., MD  ?RESIDENT PHYSICIAN: Tangala Wiegert L. Baldemar Friday, NP  ?ATTENDING PHYSICIAN: Kennedy Bucker, MD     ?CHIEF COMPLAINT: 5.5 yr f/u    ?IDENTIFYING DATA: Robin Spencer is a 71 y.o. female with a pT2N0(i-), Grade 1, ER+, PR-, Her2- IDC of the left breast s/p BCS+SNB on 02/29/16. She completed RT 04/24/16, on tamoxifen.    ?RADIATION HISTORY: She received 42.56 Gy in 16 fractions to the breast followed by a boost of 10 Gy in 4 fractions to the lumpectomy cavity. Radiation was completed on 04/24/2016.     ?DATA OF INTERVAL HISTORY:  ? Breast-FU Data Fields ~ Data ~ Comments    ? Most recent imaging date (m/d/yyyy) ~ 02/18/21 bilat mm and [place]  12/30/17 breast MRI    ~     ? Local Failure (LF) ~  No Local Failure ~     ? If LF, date (m/d/yyyy) ~  ~     ? Regional failure (RF) ~  No Regional Failure ~     ? If RF, date (m/d/yyyy) ~  ~     ? Distant Mets (DM) ~  No Distant Metastases ~     ? If DM, date (m/d/yyyy) ~  ~     ? Any failure within RT field? ~  No ~     ? RT related complications ~  No ~     ? Complications ~  ~     ? Highest complication grade  (CTCAE) ~  ---- ~  ?     Subjective:       Interval History:   Robin Spencer returns for routine posttreatment follow up since completing radiation approximately 5.5 yrs ago.    Today, she is feeling well. Regarding the treated left breast, she reports no changes since last f/u and denies any new symptoms or concerns.  She is happy w cosmesis but reports that the left nipple areola complex continues to increasingly look paler on the left and the treated left  breast remains slightly fuller compared to the right breast. The left NAC is less sensitive over time. Left  Breast tender to palpation. She reports painful areas in the left breast during recent US though otherwise denies breast pain. She reports bilateral breast tenderness, L>R. She does endorse left axillary TTP. No skin textural change. She denies cough, shortness of breath, swelling in the arm, headaches, bone pain. Continues to happily live [place] .  Uses her CPAP daily for sleep apnea and loves it as she is feeling much better since starting to use it, until recently began starting to feel fatigue x2 months had full work up in June by internal MD.     On tamoxifen and following with Dr Maye Hides, had f/u 02/27/21, no pending f/u. She plans to continue it beyond 5 yr as she likes that it helps w bone health, occasional hot flashes, decreased since last visit.    MRI breast 12/30/17:  FINDINGS:   ?  GENERAL:  -Breast density: Extreme fibroglandular tissue  -Background parenchymal enhancement: Minimal  ?  RIGHT BREAST:  -Small enhancing oval mass with circumscribed margins in the right lateral breast (post-contrast series, image 121) is stable in comparison to 02/05/2016 and benign in  appearance  -No suspicious masses, lymph nodes, or areas of abnormal enhancement  ?  LEFT BREAST:  -Postsurgical changes at 12:00 without abnormal enhancement  -No suspicious masses, lymph nodes, or areas of abnormal enhancement  ?  ADDITIONAL:  -None.  ?  ?  IMPRESSION:   ?  RIGHT BREAST: CATEGORY 1 - NEGATIVE.    ?  LEFT BREAST: No abnormal enhancement in the region of the post-surgical scar. CATEGORY 2 - BENIGN FINDINGS.  ?  OVERALL ASSESSMENT: CATEGORY 2 - BENIGN FINDINGS. Recommend routine annual mammography.     02/18/21 bilat mm and [place]:    MAMMOGRAM FINDINGS:     The breast tissue is extremely dense, which lowers the sensitivity of mammography (density D).     There are post-surgical changes seen in both breasts.     No suspicious masses, calcifications or other abnormalities are seen in either breast.     ULTRASOUND FINDINGS:     Complete four quadrant, retroareolar and axillary breast ultrasound was performed in both breasts.  Ultrasound demonstrates post surgical scars seen in both breasts.     There is no evidence of any suspicious solid mass or abnormal cystic elements.     There is no axillary lymphadenopathy.     IMPRESSION:     ?  There is no mammographic or sonographic evidence of malignancy.     Routine screening mammogram in 1 year is recommended.     The patient has a history of breast cancer. The Tyrer-Cuzick risk model does not apply.     BI-RADS Category 2:  Benign Finding(s)          PERTINENT PAST MEDICAL HISTORY:   Past Medical History:   Diagnosis Date   ? Anxiety    ? Breast cancer (HCC/RAF)    ? Cataract    ? Eczema    ? Fibroids    ? History of migraine headaches    ? History of radiation therapy 04/2016    L breast   ? OSA (obstructive sleep apnea) 11/07/2019   ? Otosclerosis    ? Post-operative nausea and vomiting    ? Rectal fissure    ? Vitreous detachment      Past Surgical History:   Procedure Laterality Date   ? BREAST BIOPSY  1986    Fibroadenoma, ductal hyperplasia   ? Laparoscopy for subfertility     ? Left finger cyst removal x 2     ? Stapedectomy, right         GYN HISTORY:   OB History   Obstetric Comments   Menses started: 17   Gravida: 0   Para:  0   Age of first live birth: n/a    Breast feed: n/a    Contraceptives/Type:  Y, 18-30    Menopause reached at: 71   Hormone replacement/Type: Y, 79-64    Fertility Tx: Y        ALLERGIES:   Allergies as of 09/16/2021 - Review Complete 08/15/2021   Allergen Reaction Noted   ? Penicillins Other (See Comments) and Rash 01/08/2012   ? Menadiol sodium diphosphate Rash 03/19/2016   ? Other  02/29/2016   ? Penicillin g  10/05/2019     MEDICATIONS:   Current Outpatient Medications   Medication Sig   ? acyclovir 400 mg tablet Take 1 tablet (400 mg total) by mouth two (2) times daily.   ? Ascorbic Acid (VITAMIN C) 1000 MG tablet    ? calcium  carbonate 1500 (600 Ca) mg tablet Take 600 mg by mouth daily.   ? cyanocobalamin 500 mcg tablet Take 1 tablet (500 mcg total) by mouth daily.   ? Homeopathic Products (TRAUMEEL) OINT Apply 4 g topically daily.   ? ketoconazole 2% shampoo APPLY TO AFFECTED AREA 3 TIMES A WEEK. LATHER, LEAVE ON FOR 5 MINUTES AND THEN RINSE WITH WATER   ? Magnesium 250 MG TABS Take 250 mg by mouth daily.   ? MAGNESIUM CITRATE PO Take 150 mg by mouth two (2) times daily.   ? Multiple Vitamins-Minerals (ALGAE BASED CALCIUM) TABS    ? Omega-3 Fatty Acids (FISH OIL PO) Take 1,400 mg by mouth two (2) times daily.   ? Probiotic Product (ALIGN) capsule    ? tamoxifen 20 mg tablet Take 1 tablet (20 mg total) by mouth daily.   ? triamcinolone 0.5% cream Apply topically as needed for.   ? UNABLE TO FIND Med Name: Promedley supplement- Epimedium, Tumeric, Japanese, Fleeceflower, EPA/DHA .   ? YUVAFEM 10 MCG vaginal tablet PLACE 1 TABLET VAGINALLY TWO TIMES A WEEK.   ? Zinc 50 MG TABS      No current facility-administered medications for this encounter.       SOCIAL HX:   Social History     Socioeconomic History   ? Marital status: Married   Tobacco Use   ? Smoking status: Never   ? Smokeless tobacco: Never   Substance and Sexual Activity   ? Alcohol use: No     Alcohol/week: 0.0 oz   ? Drug use: No   ? Sexual activity: Yes     Partners: Male     Birth control/protection: None   Social History Narrative    Patient is married, and has two children. She is of Ashkenazi Agricultural engineer.        Work: Retired from [employer] relations        Exercise:        Diet:        Transfusion:        Religion:       FAMILY HX:    Family History   Problem Relation Age of Onset   ? Hypertension Father    ? Stroke Father    ? Hyperlipidemia Father    ? Depression Father    ? Prostate cancer Father 80   ? Hypertension Mother    ? Asthma Mother    ? Endometrial cancer Mother 72   ? Hyperthyroidism Mother    ? Prostate cancer Brother 7   ? Breast cancer Neg Hx    ? Ovarian cancer Neg Hx    ? Malignant hypertension Neg Hx       Objective:      Physical Exam: VIDEO VISIT  There were no vitals taken for this visit.   GENERAL: The patient is a well-developed, well-nourished, female in no acute distress.  HEENT: Normocephalic and atraumatic. Anicteric sclerae. Mucous membranes are moist. Hearing is intact bilaterally.   NECK: nl ROM  BREASTS: No residual hyperpigmentation on left breast, left NAC mildly hypopigmented vs right. Left breast slightly retracted from RT, minimal asymmetry. Palpation reveals no discrete nodularity, breast very dense centrally. No appreciable fibrosis.   LYMPHATICS: deferred  CHEST: Non labored breathing.   MUSCULOSKELETAL SYSTEM: No upper extremity lymphedema. Shoulder abduction and external rotation fully intact. Minimal left axilla tightness with extension.   NEUROLOGIC EXAM: Patient is alert and oriented. Patient ambulates with a  normal gait.    Lab Review / Pathology / Radiology:     See HPI     Assessment:      Robin Spencer is a 71 y.o. female with a pT2N0(i-), Grade 1, ER+, PR-, Her2- IDC of the left breast s/p BCS+SNB on 02/29/16 followed by radiation, on tamoxifen. NED     Plan/ Recommendation:      - On tamoxifen, following w Dr. Maye Hides, last seen on 02/27/21, no f/u scheduled  - Bilateral mammogram scheduled for 02/19/22; Supplemental U/S due to extremely dense breast tissue and mammo occult cancer.   - I explained that I would be happy to follow up with her at any time in the future in person, by phone, or by email should she have any questions or concerns. Can f/u as desired now 5 yrs post RT. Pt prefers to follow-up in 1 year.     Thank you kindly for allowing me to participate in the care of this lovely patient.  Low level of Medical Decision Making: (at least 2 of below)  [x]  Low complexity of problems addressed. Cancer of overlapping sites of left female beast. This is a stable chronic illness.  [x]  Limited amount/complexity of data personally reviewed and analyzed by me (at least 1 of below)   ? [x]  Category 1 (tests, documents): review of prior external notes [providers/dates: Dr Maye Hides, review of test results (mammogram [dates: 02/18/21], ultrasound [dates: 02/18/21])  ? []  Category 2: assessment requiring an independent historian  []  Low (but more than minimal) risk of complications/morbidity of additional testing or treatment        Total time: I spent 25 minutes on the day of service, which included:  [x]  Face-to-face and non-face-to-face time spent with the patient  [x]  Preparing to see the patient (e.g. review of tests, imaging)  [x]  Obtaining and/or reviewing separately obtained history  [x]  Performing a medically appropriate exam and evaluation  [x]  Counseling and education with the patient and family/caregiver  []  Ordering medications, tests, procedures  []  Referring or communicating with other healthcare professionals  [x]  Documenting clinical information in the EHR  []  Independently interpreting results and communicating results to patient/family/caregiver         cc Pregler, Weyman Croon., MD  Cyril Loosen., MD    Author: Consuella Lose. Baldemar Friday 09/12/2021 3:33 PM

## 2021-09-15 DIAGNOSIS — C50919 Malignant neoplasm of unspecified site of unspecified female breast: Secondary | ICD-10-CM

## 2021-09-15 NOTE — Patient Instructions
According to the FDA,  If you are over 70 years of age a ''bivalent'' COVID booster is recommended 4 months after your first ''bivalent'' booster (this is the COVID booster shot that became available in the late summer and early fall of 2022).  I agree with this recommendation.  Local pharmacies have this booster, as well as the [facility]'' in the [address] at [place].

## 2021-09-16 ENCOUNTER — Inpatient Hospital Stay: Payer: BC Managed Care – POS | Attending: Radiation Oncology

## 2021-09-16 ENCOUNTER — Ambulatory Visit: Payer: BC Managed Care – POS | Attending: Rheumatology

## 2021-09-16 ENCOUNTER — Ambulatory Visit: Payer: BC Managed Care – POS | Attending: "Endocrinology

## 2021-09-16 ENCOUNTER — Ambulatory Visit: Payer: BC Managed Care – POS

## 2021-09-16 DIAGNOSIS — M810 Age-related osteoporosis without current pathological fracture: Secondary | ICD-10-CM

## 2021-09-16 DIAGNOSIS — C50919 Malignant neoplasm of unspecified site of unspecified female breast: Secondary | ICD-10-CM

## 2021-09-16 DIAGNOSIS — E349 Endocrine disorder, unspecified: Secondary | ICD-10-CM

## 2021-09-16 DIAGNOSIS — M7711 Lateral epicondylitis, right elbow: Secondary | ICD-10-CM

## 2021-09-16 DIAGNOSIS — M797 Fibromyalgia: Secondary | ICD-10-CM

## 2021-09-16 DIAGNOSIS — C50812 Malignant neoplasm of overlapping sites of left female breast: Secondary | ICD-10-CM

## 2021-09-16 DIAGNOSIS — Z170 Estrogen receptor positive status [ER+]: Secondary | ICD-10-CM

## 2021-09-16 DIAGNOSIS — M1991 Primary osteoarthritis, unspecified site: Secondary | ICD-10-CM

## 2021-09-16 MED ORDER — TRAUMEEL EX OINT
4 g | Freq: Every day | CUTANEOUS | 3 refills | Status: AC
Start: 2021-09-16 — End: ?

## 2021-09-16 NOTE — Progress Notes
PATIENT: Robin Spencer  MRN: [id number]  DOB: 02-May-1950  DATE OF SERVICE: 09/16/2021    CHIEF COMPLAINT:  Fatigue    HPI   Robin Spencer is a 71 y.o. female presents for     1. Fatigue:  2 months ago noted problem with fatigue on first wakening, then improves.  Had initial labs, CBC/CMP, TSH, [place] 27.29 normal.  The patient had reduced exercise (walking, because of weather where she was living), doing dance.      2. OSA:  On CPAP now, getting a recheck of a sleep study to be sure it is optimized.     3.  ? Cervical lymphadenopathy:  Had CT scan in [place], no lymphadenopathy confirmed 2/24.      4.  Increased PTH level:  Followed by Dr. Hetty Ely.      5.  Osteoporosis:  Declining rx for now because of relatives' experience.  Due DXA 2024.         MEDS     Medications that the patient states to be currently taking   Medication Sig   ? acyclovir 400 mg tablet Take 1 tablet (400 mg total) by mouth two (2) times daily.   ? Ascorbic Acid (VITAMIN C) 1000 MG tablet    ? cyanocobalamin 500 mcg tablet Take 1 tablet (500 mcg total) by mouth daily.   ? Multiple Vitamins-Minerals (ALGAE BASED CALCIUM) TABS Calcium  Magnesium  Vitamin D.   ? Omega-3 Fatty Acids (FISH OIL PO) Take 1,400 mg by mouth two (2) times daily.   ? Probiotic Product (ALIGN) capsule    ? tamoxifen 20 mg tablet Take 1 tablet (20 mg total) by mouth daily.   ? triamcinolone 0.5% cream Apply topically as needed for.   ? UNABLE TO FIND Med Name: Promedley supplement- Epimedium, Tumeric, Japanese, Fleeceflower, EPA/DHA .   ? YUVAFEM 10 MCG vaginal tablet PLACE 1 TABLET VAGINALLY TWO TIMES A WEEK.   ? Zinc 50 MG TABS          Patient Active Problem List   Diagnosis   ? Osteoporosis   ? Eye allergies   ? Genital HSV   ? Palpitations   ? OA (osteoarthritis)   ? Visit for preventive health examination   ? Hearing loss, right   ? Seborrheic dermatitis   ? Menopause   ? BPPV (benign paroxysmal positional vertigo)   ? Fibroid   ? Cataract, nuclear sclerotic, both eyes   ? Status post LASIK surgery of both eyes   ? Breast pain   ? Invasive ductal carcinoma of left breast, ER+PR+HER2-   ? Immune to measles   ? Syncope and collapse   ? Increased PTH level   ? Vitreous floater, bilateral   ? Asymptomatic varicose veins   ? Rash, Intermittent, noted after colds   ? Fibromyalgia   ? Mucous cyst of digit of left hand   ? Other fracture of right great toe, initial encounter for closed fracture   ? OSA (obstructive sleep apnea)   ? Dyspepsia   ? Schatzki's ring   ? Vaginal atrophy   ? Esophageal motility disorder   ? History of COVID-19   ? Hypervitaminosis D   ? Lymphadenopathy of head and neck   ? Abnormal RBC   ? Breast cancer (HCC/RAF)   ? Other fatigue     Allergies   Allergen Reactions   ? Penicillins Other (See Comments) and Rash     Ampicillin, flesh  rash in 1980s   ? Menadiol Sodium Diphosphate Rash   ? Other      Red [place] coloring on meds such as advil- not allergic to actual med- just the coloring   ? Penicillin G        PHYSICAL EXAM      PE:      BP (P) 110/67 (BP Location: Right arm, Patient Position: Sitting, Cuff Size: Regular)  ~ Pulse (P) 76  ~ Temp (P) 36.7 ?C (98.1 ?F) (Tympanic)  ~ Resp (P) 17  ~ Ht 5' 3'' (1.6 m)  ~ Wt (P) 135 lb (61.2 kg) Comment: pt declined weight check, pt stated ~ LMP  (LMP Unknown)  ~ SpO2 (P) 96% Comment: @ room air ~ BMI (P) 23.91 kg/m?   Body mass index is 23.91 kg/m? (pended).    System Check if normal Positive or additional negative findings   Constit  []  General appearance     Eyes  []  Conj/Lids []  Pupils  []  Fundi     HENMT  []  External ears/nose []  Otoscopy   []  Gross Hearing []  Nasal mucosa   []  Lips/teeth/gums []  Oropharynx    []  mucus membranes []  Head     Neck  []  Inspection/palpation []  Thyroid     Resp  []  Effort []  Wheezing    []  Auscultation  []  Crackles     CV  []  Rhythm/rate   []  Murmurs   []  LEE   []  JVP non-elevated    Normal pulses:   []  Radial []  Femoral  []  Pedal     Breast  []  Inspection []  Palpation     GI  []  abd masses    []  tenderness   []  rebound/guarding   []  Liver/spleen []  Rectal     GU  M: []  Scrotum []  Penis []  Prostate   F:  []  External []  vaginal wall        []  Cervix  []  mucus        []  Uterus    []  Adnexa      Lymph  [x]  Neck []  Axillae []  Groin  Patient requested exam given concern.    MSK Specify site examined:    []  Inspect/palp []  ROM   []  Stability []  Strength/tone         Skin  []  Inspection []  Palpation     Neuro  []  CN2-12 intact grossly   []  Alert and oriented   []  DTR      []  Muscle strength      []  Sensation   []  Gait/balance     Psych  []  Insight/judgement     []  Mood/affect    []  Gross cognition        Patient informed of chaperone program: Patient Declined        LABS/STUDIES   I have:   []  Reviewed/ordered []  1 []  2 []  ? 3 unique laboratory, radiology, and/or diagnostic tests noted below          LABS:  Lab Results   Component Value Date    WBC 4.61 08/16/2021    HGB 13.8 08/16/2021    HCT 41.6 08/16/2021    MCV 97.9 08/16/2021    PLT 192 08/16/2021     Lab Results   Component Value Date    CREAT 0.69 08/16/2021    BUN 17 08/16/2021    NA 140 08/16/2021    K 4.4 08/16/2021    CL 103 08/16/2021  CO2 25 08/16/2021     Lab Results   Component Value Date    ALT 17 08/16/2021    AST 27 08/16/2021    ALKPHOS 67 08/16/2021    BILITOT 0.5 08/16/2021     Lab Results   Component Value Date    CHOL 194 05/27/2021    CHOLDLCAL 144 (H) 04/20/2014    CHOLHDL 75 05/27/2021    TRIGLY 99 04/20/2014     Lab Results   Component Value Date    TSH 1.1 08/16/2021     Lab Results   Component Value Date    HGBA1C 5.5 02/21/2021       There are no questions and answers to display.             No imaging has been resulted in the last 30 days    A&P   Robin Spencer is a a 71 y.o. female presenting for       PROBLEM & ORDERS    ICD-10-CM    1. Other fatigue  R53.83       2. Malignant neoplasm of female breast, unspecified estrogen receptor status, unspecified laterality, unspecified site of breast (HCC/RAF)  C50.919 3. Increased PTH level  E34.9       4. Fibromyalgia  M79.7       5. OSA (obstructive sleep apnea)  G47.33       6. Age-related osteoporosis without current pathological fracture  M81.0           ASSESSMENT    1. Fatigue:  2 months ago noted problem with fatigue on first wakening, then improves.  Had initial labs, CBC/CMP, TSH, [place] 27.29 normal.  The patient had reduced exercise (walking, because of weather where she was living), doing dance.      2. OSA:  On CPAP now, getting a recheck of a sleep study to be sure it is optimized.     3.  ? Cervical lymphadenopathy:  Had CT scan in [place], no lymphadenopathy confirmed 2/24.      4.  Increased PTH level:  Followed by Dr. Hetty Ely.      5.  Osteoporosis:  Declining rx for now because of relatives' experience.  Due DXA 2024.       The above recommendation were discussed with the patient.  The patient has all questions answered satisfactorily and is in agreement with this recommended plan of care.    Patient Instructions   According to the FDA,  If you are over 66 years of age a ''bivalent'' COVID booster is recommended 4 months after your first ''bivalent'' booster (this is the COVID booster shot that became available in the late summer and early fall of 2022).  I agree with this recommendation.  Local pharmacies have this booster, as well as the [facility]'' in the [address] at [place].         Return in about 8 months (around 05/18/2022) for Annual Wellness- 30 minutes.     Author:  Weyman Croon. Sianni Cloninger 09/16/2021 9:12 AM

## 2021-09-16 NOTE — Patient Instructions
Plan:      Follow Up: as needed.     - I discussed with patient the mechanism of primary and secondary fibromyalgia and multifactorial cause of central hypersensitivity and the need for treatment to address mind and body intervention, exercise, sleep and nutrition.  Infections, autoimmunity, toxins and stress can initiate the cascade of events that activate the immune cells to become sensitized to the nervous system and generate neuroinflammation.         Reference:  Jonetta Osgood. Neurogenic neuroinflammation in fibromyalgia and complex regional pain syndrome. Nat Rev Rheumatol. 2015 Nov;11(11):639-48. doi: 10.1038/nrrheum.2015.100. Epub 2015 Aug 4. PMID: 08657846.    - follow with sleep specialist     - Consider qi gong.    [email or web address]      - Referral to occupational therapy for osteoarthritis of the hands and R elbow lateral epicondylitis.     - Consider traumeel cream OTC or prescription.      [facility]  [address] [place], [place] [address]  Phone: [phone]  Fax: [phone]  [email or web address]    - Consider paraffin bath for hands osteoarthritis.     - Continue OTC magnesium citrate 400 mg daily.     - Continue turmeric, ginger, and fish oil supplementation.     - Continue beneficial lifestyle modifications.    - Follow with Dr. Augusto Garbe recommendations.      - Follow with Dr. Lorenza Chick for vitamin D3 and calcium supplementation recommendations.    - Continue B12 supplementation       - 2018 EULAR recommendations for hands osteoarthritis:   Chondroitin sulfate may be used in patients with hand OA for pain relief and improvement in functioning.     Reference:   Ayesha Rumpf, et al. 2018 update of the EULAR recommendations for the management of hand osteoarthritis [published online November 11, 2016]. Ann Rheum Dis. doi:10.1136/annrheumdis-2018-213826     - Consider the Emotional Freedom Technique for coping with stress. Available information at [email or web address] Patient to consider training with [facility].    [employer]  Certified Hypnotherapist & Advanced EFT Practitioner  Tel: [phone]  [email or web address]  [email or web address]    [email or web address]    References:  Donzetta Sprung., Mancel Parsons., Blickheuser, K., & Church, D. (2019). Clinical EFT (Emotional Freedom Techniques) Improves Multiple Physiological Markers of Health. Journal of evidence-based integrative medicine, 24, O7629842 [id number]. [email or web address].za [id number]    Vanna Scotland., & Craig, M. (2017). Emotional Freedom Techniques to Treat Posttraumatic Stress Disorder in Veterans: Review of the Evidence, Survey of Practitioners, and Proposed Clinical Guidelines. [employer], 21, 16-100. [email or web address].[email or web address]    Church, D., & House, D. (2018). Borrowing Benefits: Group Treatment With Clinical Emotional Freedom Techniques Is Associated With Simultaneous Reductions in Posttraumatic Stress Disorder, Anxiety, and Depression Symptoms. Journal of evidence-based integrative medicine, 23, O7710531. [email or web address]

## 2021-09-16 NOTE — Progress Notes
OUTPATIENT ENDOCRINOLOGY CONSULTATION    Date of Service: 09/16/2021  Referring Provider: Lorenza Chick., MD  PMD: Pregler, Weyman Croon., MD    Chief Complaint:   Osteoporosis     History of Present Illness:   Robin Spencer is a 71 y.o. female who has a past medical history of Anxiety, Breast cancer (HCC/RAF), Cataract, Eczema, Fibroids, History of migraine headaches, History of radiation therapy (04/2016), OSA (obstructive sleep apnea) (11/07/2019), Otosclerosis, Post-operative nausea and vomiting, Rectal fissure, and Vitreous detachment. and presents to Endocrinology Clinic for Osteoporosis.    Osteoporosis  The patient was first found to have low bone density in 2013.    Fibromyalgia diagnosed by rheumatologist Dr. Ladona Ridgel, but pt does not feel like her pain is debilitating.    Prev on bioidentical hormones for HRT - stopped in 01/2016 or so upon dx of breast cancer.  Now on tamoxifen.    01/2017 thyroid US showed no nodules  05/2020: pt is hesitant to start osteoporosis medication and agree to hold off on now given stability of BMD.  DXA 07/11/20 showed worsening BMD, with T-score of -3.2 in the lumbar spine.    Interval Events:  Last seen 07/2020: Discussed starting alendronate 70 mg once a week but she never did as she was concerned about AEs.    Switched from Citracal to a calcium formulation without vitamin D3 for a period of time due to high vitamin D levels.  Now taking AlgaeCal Plus 360 mg BID (1600 IU of D3 total) with a little bit of food.  Taking strontium at night.    Dance lessons a few times a week.  Less walking when the weather was cooler. Now back to walking again.    No diarrhea.    Living in [place], [place]. [place] elevation.  Has a local PCP.    No falls or fractures    Remains on tamoxifen      She has had four dental implants in the past.   6-7 weeks ago had dental work with a plan for a crown in her R rear tooth but had an issue with local anesthesia - now planned on sedation with dental work.    Having some fatigue issues. Does have OSA and uses a CPAP.    Vitamin D3 in AlgaeCal   Calcium 360 mg BID  Dietary calcium: 1 cup yogurt, lots of kale/spinach daily in smoothie       Treatment History   Dates Comments    [x]  No treatment to date            Risk Factors for Accelerated Bone Loss        History of fracture  []  Yes  [x]  No    History of falls  []  Yes  [x]  No    History of malabsorption  []  Yes  [x]  No    History of nephrolithiasis  []  Yes  [x]  No    History of rheumatoid arthritis  []  Yes  [x]  No    History of hyperthyroidism  []  Yes  [x]  No    History of Cushing's  []  Yes  [x]  No    Family history of osteoporosis  [x]  Yes  []  No Osteopenia in mother   Prior/current glucocorticoid use  []  Yes  [x]  No    Antiepilepsy drugs  []  Yes  [x]  No    PPI  []  Yes  []  No Prilosec in the past for weeks only   Aromatase inhibitors  []   Yes  [x]  No    Androgen deprivation therapy  []  Yes  [x]  No    EtOH  []  Yes  []  No Rare alcohol   Tobacco  []  Yes  [x]  No      Past Medical History:     Past Medical History:   Diagnosis Date   ? Anxiety    ? Breast cancer (HCC/RAF)    ? Cataract    ? Eczema    ? Fibroids    ? History of migraine headaches    ? History of radiation therapy 04/2016    L breast   ? OSA (obstructive sleep apnea) 11/07/2019   ? Otosclerosis    ? Post-operative nausea and vomiting    ? Rectal fissure    ? Vitreous detachment        Past Surgical History:     Past Surgical History:   Procedure Laterality Date   ? BREAST BIOPSY  1986    Fibroadenoma, ductal hyperplasia   ? Laparoscopy for subfertility     ? Left finger cyst removal x 2     ? Stapedectomy, right       Medications:     Current Outpatient Medications   Medication Sig   ? acyclovir 400 mg tablet Take 1 tablet (400 mg total) by mouth two (2) times daily.   ? Ascorbic Acid (VITAMIN C) 1000 MG tablet    ? cyanocobalamin 500 mcg tablet Take 1 tablet (500 mcg total) by mouth daily.   ? Homeopathic Products (TRAUMEEL) OINT Apply 4 g topically daily.   ? ketoconazole 2% shampoo    ? Multiple Vitamins-Minerals (ALGAE BASED CALCIUM) TABS Calcium  Magnesium  Vitamin D.   ? Omega-3 Fatty Acids (FISH OIL PO) Take 1,400 mg by mouth two (2) times daily.   ? Probiotic Product (ALIGN) capsule    ? tamoxifen 20 mg tablet Take 1 tablet (20 mg total) by mouth daily.   ? triamcinolone 0.5% cream Apply topically as needed for.   ? UNABLE TO FIND Med Name: [place] supplement- Epimedium, Tumeric, Japanese, Fleeceflower, EPA/DHA .   ? YUVAFEM 10 MCG vaginal tablet PLACE 1 TABLET VAGINALLY TWO TIMES A WEEK.   ? Zinc 50 MG TABS    ? [DISCONTINUED] calcium carbonate 1500 (600 Ca) mg tablet Take 600 mg by mouth daily.   ? [DISCONTINUED] Homeopathic Products (TRAUMEEL) OINT Apply 4 g topically daily. (Patient not taking: Reported on 09/16/2021.)   ? [DISCONTINUED] Magnesium 250 MG TABS Take 250 mg by mouth daily. (Patient not taking: Reported on 09/16/2021.)   ? [DISCONTINUED] MAGNESIUM CITRATE PO Take 150 mg by mouth two (2) times daily. (Patient not taking: Reported on 09/16/2021.)     No current facility-administered medications for this visit.     Allergies:     Allergies   Allergen Reactions   ? Penicillins Other (See Comments) and Rash     Ampicillin, flesh rash in 1980s   ? Menadiol Sodium Diphosphate Rash   ? Other      Red [place] coloring on meds such as advil- not allergic to actual med- just the coloring   ? Penicillin G      Social History:    reports that she has never smoked. She has never used smokeless tobacco. She reports that she does not drink alcohol and does not use drugs.   Social History     Social History Narrative    Patient is married, and has two children.  She is of Ashkenazi Agricultural engineer.        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 Hypertension Father    ? Stroke Father    ? Hyperlipidemia Father ? Depression Father    ? Prostate cancer Father 10   ? Hypertension Mother    ? Asthma Mother    ? Endometrial cancer Mother 29   ? Hyperthyroidism Mother    ? Prostate cancer Brother 4   ? Breast cancer Neg Hx    ? Ovarian cancer Neg Hx    ? Malignant hypertension Neg Hx      Review of Systems:   14 system ROS negative except as described above and in the HPI.  Physical Examination:   Vital Signs: LMP  (LMP Unknown)  There is no height or weight on file to calculate BMI.   Wt Readings from Last 3 Encounters:   09/16/21 135 lb 1.6 oz (61.3 kg)   09/16/21 (P) 135 lb (61.2 kg)   05/28/21 133 lb (60.3 kg)      General: Well-developed, well-nourished, in no acute distress.   HEENT: Normocephalic, atraumatic. EOMI. No lid lag.  No proptosis.  No scleral icterus.   Skin: No rashes, no jaundice.  Neuro: AAOx4, moving all extremities.   Psych: Normal affect.      Laboratory Results:   I have   [] reviewed radiology,  [] reviewed labs,  [] reviewed & summarized old records, [] requested outside medical records.    Lab Results   Component Value Date    CALCIUM 8.9 09/13/2021    ICALCOR 1.08 (L) 02/21/2021    CREAT 0.69 08/16/2021    ALBUMIN 4.5 08/16/2021    CREATRANUR 9 (L) 05/17/2020    PTHINT 52 (H) 09/13/2021    VITD25OH 38 09/13/2021    TSH 1.1 08/16/2021    T4AUTO 1.3 01/22/2017    NTXRANUR TNP 01/03/2021    OSTEOCALCIN 15 01/22/2017    ALKPHOS 67 08/16/2021       Lab Results   Component Value Date    WBC 4.61 08/16/2021    HGB 13.8 08/16/2021    HCT 41.6 08/16/2021    MCV 97.9 08/16/2021    PLT 192 08/16/2021       Bone Turnover Markers  Date UNTx BSAP Serum OC                Studies:   DXA 07/11/20 Lenell Antu)  Bone Density:   -----------------------------------------------------------------   Region ? ? ? ? ? ? ? ? ? BMD ? ?T-score ?Z-score ? Classification   -----------------------------------------------------------------   AP Spine(L1-L4) ? ? ? ? ?0.696 ? -3.2 ? ? -1.2 ? ? ? Osteoporosis   Femoral Neck (Left) ? ? ?0.580 ? -2.4 ? ? -0.7 ? ? ? Osteopenia   Total Hip (Left) ? ? ? ? 0.646 ? -2.4 ? ? -1.0 ? ? ? Osteopenia   -----------------------------------------------------------------     World Health Organization criteria for BMD impression   classify patients as:   Normal (T-score at or above -1.0),   Osteopenia (T-score between -1.0 and -2.5), or   Osteoporosis (T-score at or below -2.5).     10-year Fracture  Risk:   -----------------------------------------------------------------   FRAX not reported because:   ??Some T-score for Spine Total or Hip Total or Femoral Neck at   or below -2.5   -----------------------------------------------------------------         Previous Exams:   -----------------------------------------------------------------   Region ?Exam ? ? Age ?BMD ? T-score ?BMD Change ? ? BMD Change   ?? ? ? ?Date ? ? ? ? ?g/cm2 ? ? ? ? ?vs Baseline ? ?vs Previous   -----------------------------------------------------------------   AP Spine(L1-L4)   ?? ? 07/11/2020 ?69 ?0.696 ? ?-3.2 ?-0.053(-7.1%)* -0.062(-8.1%)*   ?? ? 05/13/2018 ?67 ?0.758 ? ?-2.6 ? ?0.008(1.1%) ? ?0.008(1.1%)   ?? ? 04/17/2016 ?65 ?0.749 ? ?-2.7 ? ? ? ? ? ? ? ? ? ? ? ? ? ? ?     Total Hip(Left)   ?? ? 07/11/2020 ?69 ?0.646 ? ?-2.4 ?-0.032(-4.8%)* -0.041(-6.0%)*   ?? ? 05/13/2018 ?67 ?0.687 ? ?-2.1 ? ?0.009(1.3%) ? ?0.009(1.3%)   ?? ? 04/17/2016 ?65 ?0.678 ? ?-2.2 ? ? ? ? ? ? ? ? ? ? ? ? ? ? ?     Femoral Neck(Left)   ?? ? 07/11/2020 ?69 ?0.580 ? ?-2.4 ?-0.018(-2.9%) ?-0.027(-4.5%)   ?? ? 05/13/2018 ?67 ?0.607 ? ?-2.2 ? ?0.010(1.6%) ? ?0.010(1.6%)   ?? ? 04/17/2016 ?65 ?0.597 ? ?-2.3 ? ? ? ? ? ? ? ? ? ? ? ? ? ? ?   -----------------------------------------------------------------   *Denotes significance at 95% confidence level, LSC for AP Spine   = 0.022 g/cm2, ?LSC for Total Hip = 0.027 g/cm2 ? ?     Impression: The patient has osteoporosis, based on the Total   Spine T-score. The BMD for the AP Spine(L1-L4) decreased,   changing by -8.1% since the last DXA exam. The BMD for the   Total Hip(Left) decreased, changing by -6.0% since the last DXA   exam.     DXA 05/13/18 Lenell Antu)   Assessment:  1. The left hip T-score of -2.1 meets the criteria for osteopenia and reflects a change of +1.3% from the baseline exam of 04/2016.  The femoral neck T-score of -2.2 is consistent with osteopenia and reflects a change of +1.6%.  2. The right hip and femoral neck T-score's of -2.0 and -2.1 respectively, meet the criteria for osteopenia and represent the right femur baseline.  3. The PA lumbar spine T-score of -2.6 meets the criteria for osteoporosis according to the [facility] classification and reflects a change of +1.1%.    4. The *lateral lumbar spine T-score = n/a  *The lateral lumbar spine view provides a sensitive measure of trabecular bone and is primarily to be used to evaluate response to treatment. A low T-score in the lateral lumbar view alone does NOT fulfill the diagnostic criteria for osteopenia or osteoporosis. (ISCD Practice Recommendations 2006)   Z-scores used in reporting BMD for children/adolescents 5-19, pre-menopausal females and males < 50  ?    DXA 04/17/16 Lenell Antu)  Assessment:  1. The left hip and femoral neck T-score's of -2.2 and -2.3 respectively, meet the criteria for osteopenia.  2. The PA lumbar spine T-score of -2.7 meets the criteria for osteoporosis according to the [facility] classification.    3. The *lateral lumbar spine T-score = n/a    DXA 11/03/11 ([facility])  BASELINE DUAL ENERGY X-RAY ABSORPTIOMETRY (DXA) STUDY  Bone densitometry was performed using a Hologic Discovery A bone densitometer.  ?  PA Spine:       The  average bone mineral density from L1-4 is 0.787 g/cm2, 2.4 standard deviations below the mean for normal young adults (*T-scores) or 75% of normal. Compared to age and sex-matched controls (**Z-scores), this density is 0.9 standard deviations   below the mean.  ?  Total Hip:       The bone mineral density in the left total hip is 0.706 g/cm2, 1.9 standard deviations below the mean for normal young adults (*T-scores) or 75% of normal.  Compared to age and sex-matched controls (**Z-scores), this density is 1.0 standard   deviations below the mean.  ?  Femoral Neck:       The bone mineral density in the left femoral neck is 0.531 g/cm2, 2.9 standard deviations below the mean for normal young adults (*T-scores) or 63% of normal.  Compared to age and sex-matched controls (**Z-scores), this density is 1.6 standard   deviations below the mean.  ?  *T-score compares the BMD of an individual to peak bone mass and expresses the difference as a standard deviation score,**Z-score compares the BMD of an individual with age-matched, gender-matched, and race-matched controls and expresses the difference   as a standard deviation score.  ?  IMPRESSION:  1.  This is a baseline study at the [facility].  2.  The patient has osteoporosis.    Results for orders placed or performed during the hospital encounter of 02/11/17   US thyroid-parathyroid    Narrative    US THYROID PARATHYROID     CLINICAL HISTORY: thyroid nodule.    COMPARISON: None.    TECHNIQUE: Utilizing high resolution ultrasound transducers, real time 2D sonography and limited color Doppler imaging of the neck was performed.    FINDINGS:      The right thyroid lobe measures 4.7 x 1.1 x 1.1 cm and the left thyroid lobe measures 3.5 x 1.0 x 1.3 cm. The isthmus measures 2 mm in AP dimension.      The background thyroid parenchyma is homogeneous. Blood flow in the gland is normal on color Doppler.    No nodules are seen.    No parathyroid nodule is identified.    No morphologically abnormal or enlarged cervical chain lymph nodes.      Impression    IMPRESSION:    Normal ultrasound of the thyroid.      *From the American Thyroid Association 2015 Management Guidelines for Adult Patients with Thyroid Nodules:  High suspicion nodules: Recommend FNA if > 1 cm (Strong recommendation, Moderate-quality evidence);  Intermediate suspicion nodules: Recommend FNA if > 1 cm (Strong recommendation, Low-quality evidence);  Low suspicion nodules: Recommend FNA if > 1.5 cm (Weak recommendation, Low-quality evidence);  Very low suspicion nodules: Consider FNA if > 2 cm versus observation (Weak recommendation, Moderate-quality evidence);  Benign nodules: No biopsy (Strong recommendation, Moderate-quality evidence).    Dictated by: Mohammed Suhail   02/11/2017 12:02 PM    Signed by: Roanna Raider RAGAVENDRA   02/11/2017 12:56 PM             DEXA  Date LS T-score FN T-score TH T-score Rad T-score % Change BMD                Assessment:   Tamarra Geiselman is a 72 y.o. female seen in the Endocrine clinic for evaluation and management of osteoporosis.    Plan:   1. Osteoporosis, unspecified osteoporosis type, unspecified pathological fracture presence: diagnosed in 2013 on DXA, about stable on DXA in 2018 but on  different machine.  DXA 2020 shows stability in BMD as well. Risk factors for accelerated bone loss: +fam hx, post-menopausal woman, Caucasian, thin. DXA 06/2020 showed worsening BMD in the lumbar spine with T-score -3.2.  - Discussed treatment options including bisphoshonates (PO, IV), Prolia, Tymlos/Forteo, and Evenity. The patient declines all osteoporosis medication for now due to concern for AEs.  - bone turnover markers 05/2020 look stable. Will repeat bone turnover markers next year.  - discussed avoiding twisting under force and lifting items from the floor without bending her knees.  - Encouraged weight bearing exercise  - Goal calcium intake is 1200 mg of elemental calcium per day, ideally more in diet than in supplement.  - Goal vitamin D intake is 800 IU of vitamin D3 per day. Goal vit D 25 OH level is >30.  - Next DXA 06/2022 in [place]    2. Hyperparathyroidism: Elevated PTH and normal calcium.  Differential diagnoses include secondary hyperparathyroidism (vitamin D deficiency, hypercalciuria, decreased renal function, low calcium intake) vs ''normocalcemic'' primary hyperparathyroidism. 24 hr urine calcium on the low side at 88 in 2018. PTH normalized with increased calcium intake in the past but PTH is a hair high now.  - calcium supplement total of 720 mg daily  - dietary calcium intake discussed  - continue D3 in calcium supplement (level is adequate)  - repeat PTH next year.    3. Invasive ductal carcinoma of left breast, ER+PR+HER2-  - on Tamoxifen        Patient Instructions   Please schedule your bone density scan after 07/12/2022 at [facility] in [place] by calling [phone] ([address], [place], [place] [address]).     Around that time get your labs fasting in the morning.    Return in about 46 weeks (around 08/04/2022).     30 minutes spent preparing for this visit, obtaining history, evaluating/examining/counseling the patient, and documenting into the electronic health record.     Author:  Lorenza Chick, MD 09/16/2021 4:32 PM

## 2021-09-16 NOTE — Patient Instructions
Please schedule your bone density scan after 07/12/2022 at [facility] in [place] by calling [phone] ([address], [place], [place] [address]).     Around that time get your labs fasting in the morning.

## 2021-09-16 NOTE — Patient Instructions
Thank you for visiting [place] today in [facility].     Please follow-up with Dr. Lindajo Royal as needed in the future by phone, e-mail, or in-person should you have any questions or concerns.    You may contact us at [phone] if you have any questions.    You will be due for follow up mammogram and ultrasound on 02/19/2022.  Please call the Women's Imaging Department at [phone] to schedule the imaging at your convenience. The Women's Imaging Department will obtain insurance authorization once you have scheduled the appointment.      Please continue to follow up with your Medical Oncologist. It is very important to follow up as instructed because it allows [place] to provide you the best care possible.

## 2021-09-16 NOTE — Progress Notes
Pt is a 71 y.o. female who presents today for:  Chief Complaint   Patient presents with   ? Follow-up       Subjective:      Last Visit  05/17/2020:    Patient is doing ''even better'' than last visit.    In summer 2021, patient was experiencing palpitations. During work up patient was found to have low oxygen at night and OSA. Uses CPAP machine and palpitations resolved. She feels refreshed when she wakes up.     Patient had COVID-19 in December 2021. Felt like minor cold.      Patient had 8-hour cosmetic surgery on 05/01/2020. Has noticed soreness around the elbows.      Patient's brother and father have similar osteoarthritis in the hands. Endorses some discomfort related to osteoarthritis of hands. Does not take Tylenol or Motrin.     Interval history 09/16/2021:  - New complaint of pain R elbow area, lateral aspect for about 5-6 months. Denies tingling/numbness in the hands    - stiffness is the lower back for minutes    Also c/o pain R 1st CMC area with overuse.      She did PT for ''strenghtening the R wrist'' and the pain got worse.    Does admit to myofascial pain. Stress exacerbated the fibromyalgia symptoms. Fatigue is worse. TSH is normal.      ---------------------------------------------------------------------  ---------------------------------------------------------------------  ---------------------------------------------------------------------  The following records were reviewed and/or discussed with patient below:   Component      Latest Ref Rng 09/13/2021   Vitamin D,25-Hydroxy      20 - 50 ng/mL 38    PTH, Intact      11 - 51 pg/mL 52 (H)    Calcium      8.6 - 10.4 mg/dL 8.9       Legend:  (H) High  Component      Latest Ref Rng 08/16/2021   Sodium      135 - 146 mmol/L 140    Potassium      3.6 - 5.3 mmol/L 4.4    Chloride      96 - 106 mmol/L 103    Total CO2      20 - 30 mmol/L 25    Anion Gap      8 - 19 mmol/L 12    Glucose      65 - 99 mg/dL 93    Creatinine      1.19 - 1.30 mg/dL 1.47 Estimated GFR      See GFR Additional Information mL/min/1.55m2 >89    GFR Additional Information See Comment    Urea Nitrogen      7 - 22 mg/dL 17    Calcium      8.6 - 10.4 mg/dL 9.3    TOTAL PROTEIN      6.1 - 8.2 g/dL 7.0    Albumin      3.9 - 5.0 g/dL 4.5    Bilirubin,Total      0.1 - 1.2 mg/dL 0.5    Alkaline Phosphatase      37 - 113 U/L 67    AST (SGOT)      13 - 62 U/L 27    ALT (SGPT)      8 - 70 U/L 17    White Blood Cell Count      4.16 - 9.95 x10E3/uL 4.61    Red Blood Cell Count      3.96 -  5.09 x10E6/uL 4.25    Hemoglobin      11.6 - 15.2 g/dL 56.4    Hematocrit      34.9 - 45.2 % 41.6    Mean Corpuscular Volume      79.3 - 98.6 fL 97.9    Mean Corpuscular Hemoglobin      26.4 - 33.4 pg 32.5    MCH Concentration      31.5 - 35.5 g/dL 33.2    Red Cell Distribution Width-SD      36.9 - 48.3 fL 47.2    Red Cell Distribution Width-CV      11.1 - 15.5 % 13.2    Platelet Count, Auto      143 - 398 x10E3/uL 192    Mean Platelet Volume      9.3 - 13.0 fL 10.5    Nucleated RBC%, automated      No Ref. Range % 0.0    Absolute Nucleated RBC Count      0.00 - 0.00 x10E3/uL 0.00    Neutrophil Abs (Prelim)      See Absolute Neut Ct. x10E3/uL 2.66    Neutrophil Percent, Auto      No Ref. Range % 57.7    Lymphocyte Percent, Auto      No Ref. Range % 31.0    Monocyte Percent, Auto      No Ref. Range % 8.9    Eosinophil Percent, Auto      No Ref. Range % 1.5    Basophil Percent, Auto      No Ref. Range % 0.7    Immature Granulocytes%      No Reference Range % 0.2    Absolute Neut Count      1.80 - 6.90 x10E3/uL 2.66    Absolute Lymphocyte Count      1.30 - 3.40 x10E3/uL 1.43    Absolute Mono Count      0.20 - 0.80 x10E3/uL 0.41    Absolute Eos Count      0.00 - 0.50 x10E3/uL 0.07    Absolute Baso Count      0.00 - 0.10 x10E3/uL 0.03    Absolute Immature Gran Count      0.00 - 0.04 x10E3/uL 0.01    TSH      0.3 - 4.7 mcIU/mL 1.1    CA27.29      <=39.0 U/mL 15.5    Vitamin D,25-Hydroxy      20 - 50 ng/mL    PTH, Intact      11 - 51 pg/mL      Component      Latest Ref Rng 09/13/2021   Sodium      135 - 146 mmol/L    Potassium      3.6 - 5.3 mmol/L    Chloride      96 - 106 mmol/L    Total CO2      20 - 30 mmol/L    Anion Gap      8 - 19 mmol/L    Glucose      65 - 99 mg/dL    Creatinine      9.51 - 1.30 mg/dL    Estimated GFR      See GFR Additional Information mL/min/1.34m2    GFR Additional Information    Urea Nitrogen      7 - 22 mg/dL    Calcium      8.6 - 88.4 mg/dL 8.9    TOTAL  PROTEIN      6.1 - 8.2 g/dL    Albumin      3.9 - 5.0 g/dL    Bilirubin,Total      0.1 - 1.2 mg/dL    Alkaline Phosphatase      37 - 113 U/L    AST (SGOT)      13 - 62 U/L    ALT (SGPT)      8 - 70 U/L    White Blood Cell Count      4.16 - 9.95 x10E3/uL    Red Blood Cell Count      3.96 - 5.09 x10E6/uL    Hemoglobin      11.6 - 15.2 g/dL    Hematocrit      45.4 - 45.2 %    Mean Corpuscular Volume      79.3 - 98.6 fL    Mean Corpuscular Hemoglobin      26.4 - 33.4 pg    MCH Concentration      31.5 - 35.5 g/dL    Red Cell Distribution Width-SD      36.9 - 48.3 fL    Red Cell Distribution Width-CV      11.1 - 15.5 %    Platelet Count, Auto      143 - 398 x10E3/uL    Mean Platelet Volume      9.3 - 13.0 fL    Nucleated RBC%, automated      No Ref. Range %    Absolute Nucleated RBC Count      0.00 - 0.00 x10E3/uL    Neutrophil Abs (Prelim)      See Absolute Neut Ct. x10E3/uL    Neutrophil Percent, Auto      No Ref. Range %    Lymphocyte Percent, Auto      No Ref. Range %    Monocyte Percent, Auto      No Ref. Range %    Eosinophil Percent, Auto      No Ref. Range %    Basophil Percent, Auto      No Ref. Range %    Immature Granulocytes%      No Reference Range %    Absolute Neut Count      1.80 - 6.90 x10E3/uL    Absolute Lymphocyte Count      1.30 - 3.40 x10E3/uL    Absolute Mono Count      0.20 - 0.80 x10E3/uL    Absolute Eos Count      0.00 - 0.50 x10E3/uL    Absolute Baso Count      0.00 - 0.10 x10E3/uL    Absolute Immature Gran Count      0.00 - 0.04 x10E3/uL    TSH      0.3 - 4.7 mcIU/mL    CA27.29      <=39.0 U/mL    Vitamin D,25-Hydroxy      20 - 50 ng/mL 38    PTH, Intact      11 - 51 pg/mL 52 (H)       Legend:  (H) High    ---------------------------------------------------------------------  ---------------------------------------------------------------------  ---------------------------------------------------------------------    ---------------------------------------------------------------------  ---------------------------------------------------------------------    ROS No Yes   No Yes    No Yes  Fever [x]   []  Vision loss [x]   []  Unexpected bleeding [x]   []       Medical Decision Making:    - New complaint of pain R elbow area, lateral aspect for  about 5-6 months  []  stable/at goal   []  not stable/at goal   []  exacerbated   []  severe exacerbation   - Fibromyalgia.   05/17/2020: Stable on present regimen. Improvement.  09/16/2021: Flare due to stress  [x]  stable/at goal   []  not stable/at goal   [x]  exacerbated   []  severe exacerbation     - History of L finger mucoid cyst, referred to orthopedics.  05/17/2020: S/p removal with Dr. Augusto Garbe.  [x]  stable/at goal   []  not stable/at goal   []  exacerbated   []  severe exacerbation      - Osteoarthritis hands, doing home exercises with no significant pain.  05/17/2020: Active.   09/16/2021: Flare 1st CMC bilaterally   []  stable/at goal   []  not stable/at goal   [x]  exacerbated   []  severe exacerbation     - Neck pain, likely combination of myofascial pain and spondylosis, 09/2016 xray cervical spine shows DDD cervical spine.    05/17/2020: Active.   []  stable/at goal   [x]  not stable/at goal   []  exacerbated   []  severe exacerbation     - Osteoporosis and h/o hyperparathyroidism, under the care of Dr. Lorenza Chick.  09/16/2021: appointment today  []  stable/at goal   []  not stable/at goal   []  exacerbated   []  severe exacerbation     - OSA. Uses CPAP machine. Insomnia, improvement with OTC magnesium supplementation.   [x]  stable/at goal   []  not stable/at goal   []  exacerbated   []  severe exacerbation     - History of palpitations. Resolved with CPAP machine.  [x]  stable/at goal   []  not stable/at goal   []  exacerbated   []  severe exacerbation   - H/o migraine headaches before menopause.   []  stable/at goal   []  not stable/at goal   []  exacerbated   []  severe exacerbation     - History of COVID-19 in December 2021.   [x]  stable/at goal   []  not stable/at goal   []  exacerbated   []  severe exacerbation     Plan:   ?  Follow Up: as needed.     - I discussed with patient the mechanism of primary and secondary fibromyalgia and multifactorial cause of central hypersensitivity and the need for treatment to address mind and body intervention, exercise, sleep and nutrition.  Infections, autoimmunity, toxins and stress can initiate the cascade of events that activate the immune cells to become sensitized to the nervous system and generate neuroinflammation.         Reference:  Jonetta Osgood. Neurogenic neuroinflammation in fibromyalgia and complex regional pain syndrome. Nat Rev Rheumatol. 2015 Nov;11(11):639-48. doi: 10.1038/nrrheum.2015.100. Epub 2015 Aug 4. PMID: 54098119.    - follow with sleep specialist     - Consider qi gong.    [email or web address]      - Referral to occupational therapy for osteoarthritis of the hands and R elbow lateral epicondylitis.     - Consider traumeel cream OTC or prescription.      [facility]  [address] [place], [place] [address]  Phone: [phone]  Fax: [phone]  [email or web address]    - Consider paraffin bath for hands osteoarthritis.     - Continue OTC magnesium citrate 400 mg daily.  ?  - Continue turmeric, ginger, and fish oil supplementation.     - Continue beneficial lifestyle modifications.    - Follow with Dr. Augusto Garbe recommendations.      -  Follow with Dr. Lorenza Chick for vitamin D3 and calcium supplementation recommendations.    - Continue B12 supplementation   ??  - 2018 EULAR recommendations for hands osteoarthritis:   Chondroitin sulfate may be used in patients with hand OA for pain relief and improvement in functioning.     Reference:   Ayesha Rumpf, et al. 2018 update of the EULAR recommendations for the management of hand osteoarthritis [published online November 11, 2016]. Ann Rheum Dis. doi:10.1136/annrheumdis-2018-213826      - Consider seeing Dr. Charlann Noss at [facility] for treatment of chronic pain. Please call [phone] to make an appointment.    [address]  [place], [place] [address]    [address], [place] [address]    [address] [place], [place] [address]      - Advised patient of the following:    -- You should always schedule an appointment to discuss your results.  Alternatively, always contact me for  your results and I can e-mail them via mychart or mail them.  You should always hear from me about your test results.   -- If not already done, please set up your online charting account to receive emails and check your labs. I recommend setting up mychart for online access to your test results.  Mychart messages should be reserved for non-urgent and BRIEF questions.  For urgent messages, please call and/or schedule an appointment.  These messages are only received during business hours.   -- Also if you would like to get a list of things ordered prior to getting them done, please ask the front desk for the after visit summary (AVS) before getting labs or imaging done.  -- Please check with your insurance about your financial responsibility for services rendered (consultation, laboratories and radiological studies).  -- If you get labs done at an outside facility please call within 24 hours to notify us to ensure we receive those results.        No orders of the defined types were placed in this encounter.    I discussed with patient at length need of regular exercise, weight management and methods of coping with psychosocial stressors.   I reviewed the plan of care in detail with patient. Patient had the opportunity to ask questions and expressed satisfaction with the plan of care.  The benefits and risks of medications were also explained in detail.    []  If box is checked, patient advised to monitor CBC (white blood cell count, hemoglobin, platelet count), liver enzyme levels (AST, ALT), and kidney function (creatinine) every 3 months for possible __ side effects.    []  If box is checked, patient is taking plaquenil (hydroxychloroquine), and advised to schedule retinal exam/eye exam by ophthomology once a year.      Billing By Complexity:       As noted above, I have:    [x]  Reviewed, analyzed, and/or ordered []  1 []  2 [x]  ? 3 unique laboratory, radiology, and/or diagnostic tests    []  Reviewed  []  1 []  2 []  ? 3 prior external notes and incorporated into patient assessment    [x]  Independently interpreted studies    []  Discussed management or test interpretation with external provider(s)    [x]  Risk status: []  Minimal []  Low [x]  Moderate []  High    Billing By Time:        [x]  Total  of  _30_  minutes spent on the visit time including: preparation, obtaining/reviewing previously obtained history, physical examination, counseling the patient and/or their representative(s), documentation, any independent interpretation of results, and care coordination as documented in this encounter.     []  Total of  _30_  minutes spent on the telemedicine/telephone visit including: preparation, obtaining/reviewing previously obtained history, physical examination, counseling the patient and/or their representative(s), documentation, any independent interpretation of results, and care coordination as documented in this encounter.     Procedures:         Past Medical History:   No specialty comments available.    Patient Active Problem List    Diagnosis Date Noted   ? Other fatigue 08/15/2021   ? Breast cancer (HCC/RAF)      2018, per Dr. Maye Hides: [name] and I discussed starting tamoxifen x5-10 years instead of an aromatase inhibitor for the following reasons: 1) she's on low-dose HRT (DHEA, which is converted to estrogen) for vaginal atrophy and 2) she has osteoporosis     ? Lymphadenopathy of head and neck 04/30/2021     2023, bilateral, noted in [place], follow up recommended.  ; follow up CT scan no adenopathy, report sent via [facility] 05/13/2021.      ? Abnormal RBC 04/30/2021     2023 outside lab read nucleated RBC= 1, follow up CBC ordered.      ? Hypervitaminosis D 09/28/2020     2022     ? Esophageal motility disorder 05/17/2020     2022:  Esophageal dysmotility:  Improved.  Bravo study not consistent with GERD.     ? History of COVID-19 05/17/2020     02/2020, dx'd in [place]     ? Vaginal atrophy 05/16/2020   ? Schatzki's ring 12/01/2019     On EGD 2021 By Dr. Cleda Clarks, Bravo placed.  No Barrett's esophagus.      ? Dyspepsia      2021 with sx, saw Dr. Donnamarie Poag, plan: PPI, will plan for EGD w/ Bravo to confirm or disprove reflux given atypical presentation of symptoms. EGD 2021 Dr. Cleda Clarks:  ESOPHAGUS, PROXIMAL, INLET PATCH (BIOPSY):  - Gastric oxyntic heteropia, consistent with inlet patch- No intestinal metaplasia, BRAVO placed. BRAVO:  Negative study for gastroesophageal reflux disease.  Consider workup of supragastric belching if clinically indicated..  Plan follow up Dr. Donnamarie Poag.     ? OSA (obstructive sleep apnea) 11/07/2019   ? Other fracture of right great toe, initial encounter for closed fracture 05/06/2019     2021:  Patient presents with right big toe pain, swelling, and tenderness after trauma last night. X-ray completed during this visit confirms fracture of big toe.  - Reviewed x-ray findings and images with patient  - STAT referral to patient's podiatrist for ongoing management  - Provided post-op shoe with firm sole and crutches to patient, discussed importance of avoiding any bending of the toe  - Reviewed symptomatic care with Tylenol/Ibuprofen PRN, elevation, ice  ?  ?      Orders Placed This Encounter   ? Referral to Podiatry   ?       ? Mucous cyst of digit of left hand 06/02/2018   ? Fibromyalgia 05/10/2018     Better sleep with flexeril       ? Rash, Intermittent, noted after colds 04/29/2018   ? Asymptomatic varicose veins 03/01/2018   ? Vitreous floater, bilateral 06/11/2017     No retinal detachment, retinal tear, or vitreous hemorrhage on  dilated fundus exam. Signs and symptoms reviewed.     ? Increased PTH level 05/08/2017     Sees Dr. Selena Batten in endocrinology.      ? Syncope and collapse 06/25/2016     2018, ECG/Labs/MRI w/o gad unrevealing, saw neurology Dr. Lorina Rabon, plan; EEG, followup      ? Immune to measles 05/09/2016     (+) Ab 2018     ? Invasive ductal carcinoma of left breast, ER+PR+HER2- 01/29/2016     2017, Bilateral tomosynthesis screening mammogram on 12/11/2015. There were no suspicious findings seen. She had postsurgical changes in both breasts. Patient then developed bilateral breast pain, left greater than right. She was sent for bilateral diagnostic ultrasounds on 01/15/2016. The right breast was negative. However, in the left breast, there was an irregular mass seen with indistinct margins, measuring 5 x 6 x 4 mm, at 12 o'clock, 6 cm from the nipple. Her left axilla was negative. Genetic testing: NO MUTATION DETECTED/This panel tests three Founder Mutations in the BRCA1/2 genes (BRCA1 187delAG, BRCA1 5385insC, BRCA2 6174delT; lumpectomy: sentinal node negative x 2.  TAM recommended by oncology, annual endometrial surveillance given history by GYN.      ? Breast pain 01/09/2016     [facility] plan per NP, imaging, lifestyle changes     ? Cataract, nuclear sclerotic, both eyes 11/29/2015     Not yet visually significant. Continue observation.      ? Status post LASIK surgery of both eyes 11/29/2015     Performed by Dr. Ellin Goodie (2000 ?). Originally set with left eye for distance and low myopia for right.     ? Fibroid 04/09/2015     2017 on ultrasound.      ? BPPV (benign paroxysmal positional vertigo) 07/19/2013   ? Menopause 04/14/2013   ? Seborrheic dermatitis 05/11/2012   ? 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 Palpitations 04/07/2012     2009 ER PACs, PVCs, sinus arrythmia, ECHO, Stress ECHO WNL 2009     ? OA (osteoarthritis) 04/07/2012     Hands 2011, anti CCP neg, no evidence inflammation     ? Visit for preventive health examination 04/07/2012     Colo noscopy= hyperplastic polyps 2017     ? Hearing loss, right 04/07/2012     Otosclerosis, s/p stapedectomy     ? Eye allergies 01/08/2012     Not having significant symptoms at this time, but could use over the counter anti-histamine eye drops, like ketotifen or artificial tears as needed if symptoms recur.     ? Osteoporosis 11/10/2011     DEXA 2013, spine-2.4, femoral neck -2.9. On HT for menopause sx and as osteoporosis rx.; PA lumbar spine T-score of -2.7, will begin TAM per oncology, spine -3.2 , referred to endocrinology. Saw Dr. Elmyra Ricks in endocrinology, plan: alendronate.  Patient declined bisphosphonate and other pharmacotherapy 2022.          Past Medical History relevant to rheumatology:         Current Medications Relevant to Rheumatology:     Rheum Meds       Disp Refills Start End    tamoxifen 20 mg tablet 90 tablet 3 12/03/2020     Sig - Route: Take 1 tablet (20 mg total) by mouth daily. - Oral        Rheum Meds Other     Calcium       calcium carbonate 1500 (600 Ca) mg tablet (Discontinued)  Take 600 mg by mouth daily.           Allergies   Allergen Reactions   ? Penicillins Other (See Comments) and Rash     Ampicillin, flesh rash in 1980s   ? Menadiol Sodium Diphosphate Rash   ? Other      Red [place] coloring on meds such as advil- not allergic to actual med- just the coloring   ? Penicillin G        Physical Exam:   BP 110/67  ~ Pulse 76  ~ Temp 36.7 ?C (98.1 ?F) (Tympanic)  ~ Resp 17  ~ Ht 5' 3'' (1.6 m)  ~ Wt 135 lb 1.6 oz (61.3 kg)  ~ LMP  (LMP Unknown)  ~ SpO2 96% Comment: room air ~ BMI 23.93 kg/m?        BP 110/67  ~ Pulse 76  ~ Temp 36.7 ?C (98.1 ?F) (Tympanic)  ~ Resp 17  ~ Ht 5' 3'' (1.6 m)  ~ Wt 135 lb 1.6 oz (61.3 kg)  ~ LMP  (LMP Unknown)  ~ SpO2 96% Comment: room air ~ BMI 23.93 kg/m?   Checked Box= body part examined.  Abnormalities noted are listed next to the body part.   NL AbNL  Comment  NL AbNL Comment   General [x]  []   ENT      Eyes       Oropharynx [x]   []         Conjunct/Sclera [x]   []       Nasal mucosa []   []         Lids/Periorbital [x]   []       Ears []   []         EOM [x]   []    Neck         Pupils [x]   []       Inspect/palp [x]   []      Psychiatric       Thyroid []   []         Oriented (p,p,t) [x]   []       Neck ROM/spple []   []         Affect [x]   []    Lymphatic         Judgemnt/insite [x]   []       Neck [x]   []      Neurological       Supraclavicular []   []         CN I-XII [x]   []    Respiratory         Sensation [x]   []       Auscultation [x]   []      Skin         Chest expansn [x]   []         Inspect [x]   []    Cardiovascular         Palpate [x]   []       Auscultation [x]   []         Periungal cap bed []   []       Depnt edema []   []      Musculoskeletal    GI          Motor [x]   []       Abdomen []   []        Gait [x]   []       Liver/Spleen []   []        Nailbeds []   []    Other findings:      [x]  28 Joints, or  []  64 joints were examined.  Either exam includes all 4 extremites. Except where noted, the joints examined have FROM, no tenderness or swelling; normal stability, and normal strength.  Abnormalities are coded on homonculus or detailed below:  - [place] and [place]ouchard nodes.  - Hypersensitivity to light touch.       RIGHT         LEFT   TMJ [] []   ~ [] []  TMJ   Shoulder [] []  ~ [] []  Shoulder                Left box: Tender   Elbow [] []  ~ [] []  Elbow               Right box: Swollen   Wrist [] []  ~ [] []  Wrist   CMC1 [] []  ~ [] []  CMC1       MCP5 MCP4 MCP3 MCP2 MCP1 ~ MCP1 MCP2 MCP3 MCP4 MCP5       [] []  [] []  [] []  [] []  [] []   [] []  [] []  [] []  [] []  [] []        PIP5 PIP4 PIP3 PIP2 IP1 ~ IP1 PIP2 PIP3 PIP4 PIP5       [] []  [] []  [] []  [] []  [] []   [] []  [] []  [] []  [] []  [] []        DIP5 DIP4 DIP3 DIP2  ~  DIP2 DIP3 DIP4 DIP5       [] []  [] []  [] []  [] []     [] []  [] []  [] []  [] []       Sacroiliac []  ~             []  Sacroiliac                Hip  [] []  ~        [] []  Hip                  Knee  [] []  ~        [] []  Knee        Ankle    [] []  ~        [] []  Ankle           MTP5 MTP4 MTP3 MTP2 MTP1 ~ MTP1 MTP2 MTP3 MTP4 MTP5       [] []  [] []  [] []  [] []  [] []   [] []  [] []  [] []  [] []  [] []        Toe5 Toe4 Toe3 Toe2 Toe1 ~ Toe1 Toe2 Toe3 Toe4 Toe5        [] []  [] []  [] []  [] []  [] []   [] []  [] []  [] []  [] []  [] []     Fibromyalgia Tender Points:         Recent Labs and Test Results:     Lab Results   Component Value Date    CRP <0.3 09/23/2016    CRP <0.5 05/19/2007    SRWEST 6 09/23/2016    SRWEST 11 05/19/2007     Lab Results   Component Value Date    WBC 4.61 08/16/2021    HGB 13.8 08/16/2021    HCT 41.6 08/16/2021    PLT 192 08/16/2021    NEUTABS 2.66 08/16/2021    LYMPHABS 1.43 08/16/2021     Lab Results   Component Value Date    GLUCOSE 93 08/16/2021    CREAT 0.69 08/16/2021    CALCIUM 8.9 09/13/2021    TOTPRO 7.0 08/16/2021    ALBUMIN 4.5 08/16/2021    AST 27 08/16/2021    ALT 17 08/16/2021    ALKPHOS 67 08/16/2021     No results found for: ''C3'', ''C4'', ''DSDNAAB'', ''NDNAABIFA''  Lab Results   Component Value Date    PROTCLUR Negative 02/14/2016    BLDUR 1+ (A)  02/14/2016    LEUKESTUR Negative 02/14/2016    RBCSUR 11 02/14/2016    WBCSUR 2 02/14/2016     Lab Results   Component Value Date    VITD25OH 38 09/13/2021    VITD25OH 35 02/21/2021     Lab Results   Component Value Date    CKTOT 64 06/02/2016       Lab Visit on 09/13/2021   Component Date Value   ? Vitamin D,25-Hydroxy 09/13/2021 38    ? PTH, Intact 09/13/2021 52 (H)    ? Calcium 09/13/2021 8.9          Physician Signature:  Dr. Linus Mako 09/16/2021 9:53 AM

## 2021-09-24 MED ORDER — TAMOXIFEN CITRATE 20 MG PO TABS
ORAL_TABLET | ORAL | 1 refills | 60.00000 days
Start: 2021-09-24 — End: ?

## 2021-09-25 MED ORDER — TAMOXIFEN CITRATE 20 MG PO TABS
ORAL_TABLET | 3 refills | Status: AC
Start: 2021-09-25 — End: ?

## 2021-09-27 ENCOUNTER — Ambulatory Visit: Payer: BLUE CROSS/BLUE SHIELD | Attending: "Endocrinology

## 2021-11-20 ENCOUNTER — Ambulatory Visit: Payer: BLUE CROSS/BLUE SHIELD

## 2021-12-10 ENCOUNTER — Ambulatory Visit: Payer: BC Managed Care – POS

## 2022-02-18 ENCOUNTER — Telehealth: Payer: BC Managed Care – POS

## 2022-02-18 ENCOUNTER — Ambulatory Visit: Payer: BLUE CROSS/BLUE SHIELD | Attending: Radiation Oncology

## 2022-02-19 ENCOUNTER — Ambulatory Visit: Payer: BC Managed Care – POS | Attending: Radiation Oncology

## 2022-02-21 ENCOUNTER — Ambulatory Visit: Payer: BC Managed Care – POS | Attending: Radiation Oncology

## 2022-03-24 ENCOUNTER — Ambulatory Visit: Payer: BC Managed Care – POS

## 2022-05-18 MED ORDER — YUVAFEM 10 MCG VA TABS
ORAL_TABLET | 3 refills
Start: 2022-05-18 — End: ?

## 2022-05-21 MED ORDER — YUVAFEM 10 MCG VA TABS
ORAL_TABLET | 3 refills | Status: AC
Start: 2022-05-21 — End: ?

## 2022-05-26 ENCOUNTER — Ambulatory Visit: Payer: BC Managed Care – POS

## 2022-05-27 ENCOUNTER — Telehealth: Payer: BC Managed Care – POS

## 2022-05-27 DIAGNOSIS — Z7189 Other specified counseling: Secondary | ICD-10-CM

## 2022-05-27 DIAGNOSIS — M810 Age-related osteoporosis without current pathological fracture: Secondary | ICD-10-CM

## 2022-05-27 DIAGNOSIS — C50812 Malignant neoplasm of overlapping sites of left female breast: Secondary | ICD-10-CM

## 2022-05-27 DIAGNOSIS — Z23 Encounter for immunization: Secondary | ICD-10-CM

## 2022-05-27 DIAGNOSIS — Z170 Estrogen receptor positive status [ER+]: Secondary | ICD-10-CM

## 2022-05-27 NOTE — H&P
PATIENT:  Robin Spencer  MRN:  [id number]  DOB:  [birth year]  DATE OF SERVICE:  06/05/2022    PRIMARY CARE PROVIDER: Tyleah Loh, Weyman Croon., MD    Cc: Welcome to [employer] Initial Preventive Physical Examination OR Follow up Annual Wellness Visit Follow up and Follow up of Chronic Conditions  Subjective:     Robin Spencer is a a 72 y.o. female who presents for Initial Preventive Physical Examination or Annual Wellness Visit Follow up  and discussion and follow up of chronic conditions as listed below.    Note: This is a one time examination performed within 12 months of the first effective date of Medicare Part B coverage.  [id number]; or as a follow up 12 months after the last Annual Wellness Exam [phone], or in a person 54 years of age or older regardless of Medicare coverage.    Via MyChart:     I have been feeling well for awhile and my esophagus/hiatal hernia issues haven't been bothering me for the past few months. The only new issues are 1. occasional tingling in my left arm and fingers, 2. concern about my leg veins.    Leg vein:  Left leg, had ultrasound, no mass, will see a vascular surgeon.  Minimal symptoms.   Hyperparathyroidism/osteoporosis/history breast cancer:/hypervitaminosis D:  Saw Dr. Hetty Ely to monitor.   On TAM.  Will recheck DXA. Plan TAM x 10 years, started 2018. Will check labs.   History of breast cancer: Discharge no bleeding on TAM.   Vaginal atrophy: On Yuvafem. High cost, working with [employer].   5.    OSA: Doing well with sleep apnea machine.   6.    DJD cervical spine: Has very subtle intermittent LUE tingling, no concerns now, monitor.   7.    History of breast cancer:  Had mammo/US, on TAM.   8.    Preventive Care: Declines COVID vaccine booster.   9.    Skin lesion: On wrist, will see dermatology in [place].        Other MDs seen in continuity:  Dr. Hetty Ely (Endocrinology/osteoporosis/hyperparathyroidism with normal CA- monitor- saw 7/23), Dr. Celso Sickle (Rad Onc, saw 7/23, follow up prn), Dr. Penelope Coop (FMS, stable),     Other MDs seen in continuity:  Dr.  Brooks Sailors (PCP, [facility]ina Patches),  Dr. Chaney Malling (ophthalmologist, [facility] Patches),  Dr. Hortencia Pilar [facility]), Dr. Zoila Shutter (pulmonary/sleep apnea, [facility] Patches), Dr.  Maye Hides (Heme Onc- [place]),  Dr. Si Gaul (dermatology), , Dr. Michele Mcalpine (plastic surgeon, [name]).     Patient Active Problem List    Diagnosis Date Noted    DJD (degenerative joint disease) of cervical spine 06/05/2022     2023 on CT.  Subtle intermittent tingling of left arm and hand.       Other fatigue 08/15/2021    Breast cancer (HCC/RAF)      2018, per Dr. Maye Hides: Robin Spencer and I discussed starting tamoxifen x5-10 years instead of an aromatase inhibitor for the following reasons: 1) she's on low-dose HRT (DHEA, which is converted to estrogen) for vaginal atrophy and 2) she has osteoporosis      Lymphadenopathy of head and neck 04/30/2021     2023, bilateral, noted in [place], follow up recommended.  ; follow up CT scan no adenopathy, report sent via [facility] 05/13/2021.       Abnormal RBC 04/30/2021     2023 outside lab read nucleated RBC= 1, follow up CBC ordered.       Hypervitaminosis D 09/28/2020  2022      Esophageal motility disorder 05/17/2020     2022:  Esophageal dysmotility:  Improved.  Bravo study not consistent with GERD.      History of COVID-19 05/17/2020     02/2020, dx'd in [place]      Vaginal atrophy 05/16/2020    Schatzki's ring 12/01/2019     On EGD 2021 By Dr. Cleda Clarks, Bravo placed.  No Barrett's esophagus.       Dyspepsia      2021 with sx, saw Dr. Donnamarie Poag, plan: PPI, will plan for EGD w/ Bravo to confirm or disprove reflux given atypical presentation of symptoms. EGD 2021 Dr. Cleda Clarks:  ESOPHAGUS, PROXIMAL, INLET PATCH (BIOPSY):  - Gastric oxyntic heteropia, consistent with inlet patch- No intestinal metaplasia, BRAVO placed. BRAVO:  Negative study for gastroesophageal reflux disease.  Consider workup of supragastric belching if clinically indicated..  Plan follow up Dr. Donnamarie Poag.      OSA (obstructive sleep apnea) 11/07/2019    Other fracture of right great toe, initial encounter for closed fracture 05/06/2019     2021:  Patient presents with right big toe pain, swelling, and tenderness after trauma last night. X-ray completed during this visit confirms fracture of big toe.  - Reviewed x-ray findings and images with patient  - STAT referral to patient's podiatrist for ongoing management  - Provided post-op shoe with firm sole and crutches to patient, discussed importance of avoiding any bending of the toe  - Reviewed symptomatic care with Tylenol/Ibuprofen PRN, elevation, ice            Orders Placed This Encounter    Referral to Podiatry            Mucous cyst of digit of left hand 06/02/2018    Fibromyalgia 05/10/2018     Better sleep with flexeril        Rash, Intermittent, noted after colds 04/29/2018    Asymptomatic varicose veins 03/01/2018    Vitreous floater, bilateral 06/11/2017     No retinal detachment, retinal tear, or vitreous hemorrhage on dilated fundus exam. Signs and symptoms reviewed.      Increased PTH level 05/08/2017     Sees Dr. Selena Batten in endocrinology.       Syncope and collapse 06/25/2016     2018, ECG/Labs/MRI w/o gad unrevealing, saw neurology Dr. Lorina Rabon, plan; EEG, followup       Immune to measles 05/09/2016     (+) Ab 2018      Invasive ductal carcinoma of left breast, ER+PR+HER2- 01/29/2016     2017, Bilateral tomosynthesis screening mammogram on 12/11/2015. There were no suspicious findings seen. She had postsurgical changes in both breasts. Patient then developed bilateral breast pain, left greater than right. She was sent for bilateral diagnostic ultrasounds on 01/15/2016. The right breast was negative. However, in the left breast, there was an irregular mass seen with indistinct margins, measuring 5 x 6 x 4 mm, at 12 o'clock, 6 cm from the nipple. Her left axilla was negative. Genetic testing: NO MUTATION DETECTED/This panel tests three Founder Mutations in the BRCA1/2 genes (BRCA1 187delAG, BRCA1 5385insC, BRCA2 6174delT; lumpectomy: sentinal node negative x 2.  TAM recommended by oncology, annual endometrial surveillance given history by GYN.       Breast pain 01/09/2016     [facility] plan per NP, imaging, lifestyle changes      Cataract, nuclear sclerotic, both eyes 11/29/2015     Not yet visually significant. Continue  observation.       Status post LASIK surgery of both eyes 11/29/2015     Performed by Dr. Ellin Goodie (2000 ?). Originally set with left eye for distance and low myopia for right.      Fibroid 04/09/2015     2017 on ultrasound.       BPPV (benign paroxysmal positional vertigo) 07/19/2013    Menopause 04/14/2013    Seborrheic dermatitis 05/11/2012    Genital HSV 04/07/2012    Palpitations 04/07/2012     2009 ER PACs, PVCs, sinus arrythmia, ECHO, Stress ECHO WNL 2009      OA (osteoarthritis) 04/07/2012     Hands 2011, anti CCP neg, no evidence inflammation      Visit for preventive health examination 04/07/2012     Colo noscopy= hyperplastic polyps 2017      Hearing loss, right 04/07/2012     Otosclerosis, s/p stapedectomy      Eye allergies 01/08/2012     Not having significant symptoms at this time, but could use over the counter anti-histamine eye drops, like ketotifen or artificial tears as needed if symptoms recur.      Osteoporosis 11/10/2011     DEXA 2013, spine-2.4, femoral neck -2.9. On HT for menopause sx and as osteoporosis rx.; PA lumbar spine T-score of -2.7, will begin TAM per oncology, spine -3.2 , referred to endocrinology. Saw Dr. Elmyra Ricks in endocrinology, plan: alendronate.  Patient declined bisphosphonate and other pharmacotherapy 2022.            Allergies   Allergen Reactions    Penicillins Other (See Comments) and Rash     Ampicillin, flesh rash in 1980s    Menadiol Sodium Diphosphate Rash    Other      Red [place] coloring on meds such as advil- not allergic to actual med- just the coloring    Penicillin G        Current Outpatient Medications (GI Meds)   Medication Sig Dispense Refill    Probiotic Product (ALIGN) capsule        Current Outpatient Medications (Nutrition)   Medication Sig Dispense Refill    Ascorbic Acid (VITAMIN C) 1000 MG tablet       Omega-3 Fatty Acids (FISH OIL PO) Take 1,400 mg by mouth two (2) times daily.      Zinc 50 MG TABS       Multiple Vitamins-Minerals (ALGAE BASED CALCIUM) TABS Calcium  Magnesium  Vitamin D. (Patient not taking: Reported on 06/05/2022.)       Current Outpatient Medications (GU Meds)   Medication Sig Dispense Refill    YUVAFEM 10 MCG vaginal tablet PLACE 1 TABLET VAGINALLY TWO TIMES A WEEK. 24 tablet 3     Current Outpatient Medications (Heme Meds)   Medication Sig Dispense Refill    cyanocobalamin 500 mcg tablet Take 1 tablet (500 mcg total) by mouth daily.       Current Outpatient Medications (Chemotherapy)   Medication Sig Dispense Refill    TAMOXIFEN 20 mg tablet TAKE 1 TABLET BY MOUTH EVERY DAY 100 tablet 3     Current Outpatient Medications (Antibiotics)   Medication Sig Dispense Refill    acyclovir 400 mg tablet Take 1 tablet (400 mg total) by mouth two (2) times daily. 180 tablet 3     Current Outpatient Medications (Derm Meds)   Medication Sig Dispense Refill    ketoconazole 2% shampoo  (Patient not taking: Reported on 06/05/2022.)      triamcinolone  0.5% cream Apply topically as needed for. (Patient not taking: Reported on 06/05/2022.) 1 tube 1     Current Outpatient Medications (Miscellaneous)   Medication Sig Dispense Refill    Homeopathic Products (TRAUMEEL) OINT Apply 4 g topically daily. 50 g 3     Current Outpatient Medications (Other)   Medication Sig Dispense Refill    UNABLE TO FIND Med Name: [place] supplement- Epimedium, Tumeric, Japanese, Fleeceflower, EPA/DHA .           Past Medical History:   Diagnosis Date    Anxiety     Breast cancer (HCC/RAF)     Cataract     Eczema     Fibroids     History of migraine headaches     History of radiation therapy 04/2016    L breast    OSA (obstructive sleep apnea) 11/07/2019    Otosclerosis     Post-operative nausea and vomiting     Rectal fissure     Vitreous detachment        Past Surgical History:   Procedure Laterality Date    BREAST BIOPSY  1986    Fibroadenoma, ductal hyperplasia    Laparoscopy for subfertility      Left finger cyst removal x 2      Stapedectomy, right         Family History   Problem Relation Age of Onset    Hypertension Father     Stroke Father     Hyperlipidemia Father     Depression Father     Prostate cancer Father 40    Hypertension Mother     Asthma Mother     Endometrial cancer Mother 47    Hyperthyroidism Mother     Prostate cancer Brother 35    Breast cancer Neg Hx     Ovarian cancer Neg Hx     Malignant hypertension Neg Hx        Akari Defelice  reports that she has never smoked. She has never been exposed to tobacco smoke. She has never used smokeless tobacco. She reports that she does not drink alcohol and does not use drugs. Lives primarily in [place], comes to [place] for medical care.  Travels internationally to [place] this year.     Opioid use:     [  ]  Yes, urine drug screen ordered.   [  ]  Yes. No urine drug screen ordered, reason;   [x  ]  No    On Immunnosuppressive Rx:      [  ]  Yes  [  x]  No      Lung Cancer Screening Assessment (age 27-80)    [  x ]  Less than 20 pack years tobacco history OR nonsmoker  [  x ]  Quit smoking more than 15 years ago OR nonsmoker  [   ]  Lung cancer screening recommended and discussed  [  ]  Accepted  [  ]  Declined  [   ]  Lung cancer screening not discussed because of age/comorbidities    AAA Screening Assessment (once, age 83-75 years)  [ ]  Males between 11 and 48 years of age who smoked at least 100 cigarettes  [  ] Males or females with a family history of AAA  [ x ] AAA screening not recommended because of lack of risk factors OR age/comorbidities    Diet: Vegetables and protein.     Physical Activities, including  limitations:  Walks 2 miles three or four days a week, dances several times a week, line dancing.     Self assessment of health: Good.     Social support: Husband and friends.     Activities of Daily Living:       [  ] Needs assistance with:  [  ] bathing: personal hygiene and grooming; [  ] dressing: dressing and undressing; [  ] transferring: movement and mobility; [  ] toileting: continence-related tasks including control and hygiene; [  ] eating: preparing food and feeding    [ x ] Independent in ADLs     Instrumental Activities of Daily Living :      [  ] Needs assistance with: [  ] cooking, [  ] cleaning, [  ] finances, [  ] shopping, [  ] healthcare and medication, [  ] using telephones and technology, [  ] transportation, and [  ] caring for other individuals and pets.    [ x ] Independent in IADLs     Fall risk:      (1) Falls in the past year? No  (2) Injury from fall in the past year? No    [   ] Referred to physical therapy for gait training.     Home Safety:  None.  Has double railing to go up and down stairs.     Goals of Care/DPA: Not currently on file at [facility] [place].       Immunization History   Administered Date(s) Administered    COVID-19 [place] SARS-CoV-2 vaccine, unspecified 04/06/2019    COVID-19, mRNA, (Moderna) 100 mcg/0.5 mL 04/06/2019, 05/04/2019, 06/30/2019, 07/28/2019, 01/09/2020, 02/04/2020, 06/28/2020    Hepatitis A, Adult 12/10/2006, 04/24/2016    Hepatitis A, unspecified formulation 12/10/2006    Influenza vaccine IM quadrivalent (Afluria Quad) (PF) SYR (60 years of age and older) 12/22/2018, 12/29/2019    Tdap 04/26/2010, 01/24/2017, 12/22/2018    Typhoid Inactivated, ViCPs 04/24/2016    influenza vaccine IM cell culture quadrivalent (Flucelvax QUAD) (PF) SYR (4 months of age and older) 12/28/2020    influenza vaccine IM quadrivalent (Fluzone Quad) (PF) SYR/SDV (18 months of age and older) 02/14/2014    influenza vaccine IM quadrivalent adjuvanted (FluAD Quad) (PF) SYR (80 years of age and older) 12/20/2019, 01/17/2022 influenza vaccine IM quadrivalent high dose (Fluzone High Dose Quad) (PF) SYR (67 years of age and older) 12/08/2018    influenza vaccine IM trivalent adjuvanted (FluAD) (PF) SYR (39 years of age and older) 01/12/2018    influenza vaccine IM trivalent high dose (Fluzone High Dose) (PF) SYR (73 years of age and older) 01/16/2017    influenza, unspecified formulation 02/01/1999, 01/22/2000, 01/28/2002, 02/14/2014, 02/01/2016, 01/12/2018    pneumococcal conjugate vaccine 20-valent (Prevnar 20) 01/18/2021    pneumococcal polysaccharide vaccine 23-valent (Pneumovax) 05/18/2018    zoster live vaccine (Zostavax) 04/15/2012    zoster vac recomb adjuvanted (Shingrix) 06/26/2016, 06/02/2017         Health Maintenance   Topic Date Due    Advance Directive  Never done    COVID-19 Vaccine(Tracks primary and booster doses, not sup/immunocomp) (9 - 2023-24 season) 11/15/2021    Annual Preventive Wellness Visit  05/31/2022    Breast Ca Screening: MAMMOGRAM  02/26/2023    Colorectal Cancer Screening  10/07/2025    Tdap/Td Vaccine (4 - Td or Tdap) 12/21/2028    Hepatitis B Screening  Completed    Influenza Vaccine  Completed    Pneumococcal Vaccine  Completed    Osteoporosis Early Detection DEXA Scan  Completed    Hepatitis C Screening  Completed    Shingles (Shingrix) Vaccine  Completed    Cervical Ca Screening: HPV Testing  Discontinued    Cervical Ca Screening: PAP Smear  Discontinued         Review of Systems:     Depression screen:    (1) During the past month, have you been bothered by feeling down, depressed or helpless? No  (2) During the past month, have you often been bothered by little interest or pleasure in doing things? No    Hearing:  Has hearing aids, feels doing well.     Vision:  Sees the eye doctor.     Const:  No significant weight gain or loss  Cor:  No exertional chest pain.  Pulm:  No exertional dyspnea.  Endo:  Postmenopausal         Objective:       BP 114/65  ~ Pulse 66  ~ Temp 36.2 ?C (97.2 ?F) (Forehead)  ~ Resp 18  ~ Ht 5' 3'' (1.6 m)  ~ Wt 128 lb 4.8 oz (58.2 kg)  ~ LMP  (LMP Unknown)  ~ SpO2 97%  ~ BMI 22.73 kg/m?   Body mass index is 22.73 kg/m?Marland Kitchen        System Check if normal Positive or additional negative findings   Constit  [x]  General appearance     Eyes  []  Conj/Lids []  Pupils  []  Fundi     HENMT  []  External ears/nose []  Otoscopy   []  Gross Hearing []  Nasal mucosa   []  Lips/teeth/gums []  Oropharynx    []  mucus membranes []  Head     Neck  [x]  Inspection/palpation [x]  Thyroid     Resp  [x]  Effort [x]  Wheezing    [x]  Auscultation  []  Crackles     CV  [x]  Rhythm/rate   [x]  Murmurs   [x]  LEE   []  JVP non-elevated    Normal pulses:   []  Radial []  Femoral  []  Pedal     Breast  [x]  Inspection [x]  Palpation     GI  [x]  abd masses    [x]  tenderness   [x]  rebound/guarding   []  Liver/spleen []  Rectal     GU  M: []  Scrotum []  Penis []  Prostate   F:  []  External []  vaginal wall        []  Cervix  []  mucus        []  Uterus    []  Adnexa      Lymph  [x]  Neck [x]  Axillae [x]  Groin     MSK Specify site examined:    []  Inspect/palp []  ROM   []  Stability []  Strength/tone         Skin  []  Inspection []  Palpation     Neuro  []  CN2-12 intact grossly   []  Alert and oriented   []  DTR      []  Muscle strength      []  Sensation   []  Gait/balance     Psych  [x]  Insight/judgement     [x]  Mood/affect    [x]  Gross cognition        Patient informed of chaperone program: Patient/Legal Guardian Declined          LABS:  Lab Results   Component Value Date    WBC 4.61 08/16/2021    HGB 13.8 08/16/2021    HCT 41.6 08/16/2021  MCV 97.9 08/16/2021    PLT 192 08/16/2021     Lab Results   Component Value Date    CREAT 0.66 06/05/2022    BUN 20 06/05/2022    NA 142 06/05/2022    K 5.0 06/05/2022    CL 106 06/05/2022    CO2 24 06/05/2022     Lab Results   Component Value Date    ALT 14 06/05/2022    AST 23 06/05/2022    ALKPHOS 66 06/05/2022    BILITOT 0.5 06/05/2022     Lab Results   Component Value Date    CHOL 214 06/05/2022    CHOLDLCAL 144 (H) 04/20/2014    CHOLHDL 79 06/05/2022    TRIGLY 99 04/20/2014     Lab Results   Component Value Date    TSH 1.1 08/16/2021     Lab Results   Component Value Date    HGBA1C 5.5 02/21/2021             No imaging has been resulted in the last 365 days        Assessment & Plan:     Assessment:     Leg vein:  Left leg, had ultrasound, no mass, will see a vascular surgeon.  Minimal symptoms.   Hyperparathyroidism/osteoporosis/history breast cancer:/hypervitaminosis D:  Saw Dr. Hetty Ely to monitor.   On TAM.  Will recheck DXA. Plan TAM x 10 years, started 2018. Will check labs.   History of breast cancer: Discharge no bleeding on TAM.   Vaginal atrophy: On Yuvafem. High cost, working with [employer].   OSA: Doing well with sleep apnea machine.   DJD cervical spine: Has very subtle intermittent LUE tingling, no concerns now, monitor.   History of breast cancer:  Had mammo/US, on TAM.   Preventive Care: Declines COVID vaccine booster.   Skin lesion: On wrist, will see dermatology in [place].     Annual Preventive Exam  Patient Active Problem List   Diagnosis    Osteoporosis    Eye allergies    Genital HSV    Palpitations    OA (osteoarthritis)    Visit for preventive health examination    Hearing loss, right    Seborrheic dermatitis    Menopause    BPPV (benign paroxysmal positional vertigo)    Fibroid    Cataract, nuclear sclerotic, both eyes    Status post LASIK surgery of both eyes    Breast pain    Invasive ductal carcinoma of left breast, ER+PR+HER2-    Immune to measles    Syncope and collapse    Increased PTH level    Vitreous floater, bilateral    Asymptomatic varicose veins    Rash, Intermittent, noted after colds    Fibromyalgia    Mucous cyst of digit of left hand    Other fracture of right great toe, initial encounter for closed fracture    OSA (obstructive sleep apnea)    Dyspepsia    Schatzki's ring    Vaginal atrophy    Esophageal motility disorder    History of COVID-19    Hypervitaminosis D Lymphadenopathy of head and neck    Abnormal RBC    Breast cancer (HCC/RAF)    Other fatigue    DJD (degenerative joint disease) of cervical spine     Health Maintenance   Topic Date Due    Advance Directive  Never done    COVID-19 Vaccine(Tracks primary and booster doses, not sup/immunocomp) (9 -  2023-24 season) 11/15/2021    Annual Preventive Wellness Visit  05/31/2022    Breast Ca Screening: MAMMOGRAM  02/26/2023    Colorectal Cancer Screening  10/07/2025    Tdap/Td Vaccine (4 - Td or Tdap) 12/21/2028    Hepatitis B Screening  Completed    Influenza Vaccine  Completed    Pneumococcal Vaccine  Completed    Osteoporosis Early Detection DEXA Scan  Completed    Hepatitis C Screening  Completed    Shingles (Shingrix) Vaccine  Completed    Cervical Ca Screening: HPV Testing  Discontinued    Cervical Ca Screening: PAP Smear  Discontinued         Personalized Prevention Plan Services and  Management of Medical Conditions:     Health Maintenance   Topic Date Due    Advance Directive  Never done    COVID-19 Vaccine(Tracks primary and booster doses, not sup/immunocomp) (9 - 2023-24 season) 11/15/2021    Annual Preventive Wellness Visit  05/31/2022    Breast Ca Screening: MAMMOGRAM  02/26/2023    Colorectal Cancer Screening  10/07/2025    Tdap/Td Vaccine (4 - Td or Tdap) 12/21/2028    Hepatitis B Screening  Completed    Influenza Vaccine  Completed    Pneumococcal Vaccine  Completed    Osteoporosis Early Detection DEXA Scan  Completed    Hepatitis C Screening  Completed    Shingles (Shingrix) Vaccine  Completed    Cervical Ca Screening: HPV Testing  Discontinued    Cervical Ca Screening: PAP Smear  Discontinued     Orders Placed This Encounter    Comprehensive Metabolic Panel    Vitamin D,25-Hydroxy    TSH    Chol,LDL,Quant    Cholesterol    Cholesterol, HDL    Hgb A1c     Personalized Health Advice (if applicable, includes referrals to health education or preventive counseling services or programs):      Patient Instructions   (1) Have a bone density test.  Call [phone] to make this appointment.     (2) Please send a copy of your Durable Power of Attorney/Advanced Heathcare Planning Form to our office so we can put it in our computer system.  This will make it available to all your physicians, the Emergency Department, and our hospitals, if needed.      Return in about 1 year (around 06/05/2023) for Annual Wellness- 30 minutes.    The above plan of care, diagnosis, order, and follow-up were discussed with the patient. Questions related to this recommended plan of care were answered.    Verbal or written information was provided to the patient about end of life planning/advanced care planning.     The services and diagnoses listed above in addition to the Medicare Annual Visit constituted separate and distinct services and diagnoses that were addressed and discussed with the patient  today.  Author:  Weyman Croon. Leelah Hanna 06/05/2022 10:09 AM

## 2022-05-27 NOTE — Telephone Encounter
Checked [employer] have not received a PA request yet for Yuvafem 10 mcg.    Called [facility] pharmacy, on hold for too long.  Will call them tomorrow morning.    [place]

## 2022-05-27 NOTE — Telephone Encounter
Lorrie RN please assist with a Prior Authorization per patient request thanks

## 2022-05-30 NOTE — Telephone Encounter
Called [facility] pharmacy [phone], spoke to [facility] manager) - she stated no PA is required for medication, it is covered by insurance.  Patient has a high co pay of $71.49    [place]

## 2022-06-05 ENCOUNTER — Ambulatory Visit: Payer: BLUE CROSS/BLUE SHIELD

## 2022-06-05 DIAGNOSIS — M4722 Other spondylosis with radiculopathy, cervical region: Secondary | ICD-10-CM

## 2022-06-05 DIAGNOSIS — E213 Hyperparathyroidism, unspecified: Secondary | ICD-10-CM

## 2022-06-05 NOTE — Patient Instructions
(  1) Have a bone density test.  Call [phone] to make this appointment.     (2) Please send a copy of your Durable Power of Attorney/Advanced Heathcare Planning Form to our office so we can put it in our computer system.  This will make it available to all your physicians, the Emergency Department, and our hospitals, if needed.

## 2022-08-04 ENCOUNTER — Ambulatory Visit: Payer: BLUE CROSS/BLUE SHIELD | Attending: "Endocrinology

## 2022-08-15 MED ORDER — ACYCLOVIR 400 MG PO TABS
400 mg | ORAL_TABLET | Freq: Two times a day (BID) | ORAL | 3 refills | 30.00000 days
Start: 2022-08-15 — End: ?

## 2022-08-18 ENCOUNTER — Ambulatory Visit: Payer: BLUE CROSS/BLUE SHIELD

## 2022-08-18 MED ORDER — ACYCLOVIR 400 MG PO TABS
400 mg | ORAL_TABLET | Freq: Two times a day (BID) | ORAL | 3 refills | Status: AC
Start: 2022-08-18 — End: ?

## 2022-08-18 NOTE — Telephone Encounter
Called patient no answer, left a VM.  Sent a my chart msg advising the DEXA appt for 08/29/22 has been cancelled and needs to be rescheduled as the Tech will not be in office.    PCC: If patient calls back please assist with rescheduling unable to offer sooner appt or to accommodate for a sooner appt. Please schedule next available. Thank you!

## 2022-08-29 ENCOUNTER — Ambulatory Visit: Payer: BC Managed Care – POS | Attending: "Endocrinology

## 2022-09-19 MED ORDER — TAMOXIFEN CITRATE 20 MG PO TABS
ORAL_TABLET | 2 refills | Status: AC
Start: 2022-09-19 — End: ?

## 2022-09-26 ENCOUNTER — Ambulatory Visit: Payer: BLUE CROSS/BLUE SHIELD

## 2022-09-29 MED ORDER — ALBUTEROL SULFATE HFA 108 (90 BASE) MCG/ACT IN AERS
2 | Freq: Four times a day (QID) | RESPIRATORY_TRACT | 1 refills | Status: AC | PRN
Start: 2022-09-29 — End: ?

## 2022-10-07 ENCOUNTER — Ambulatory Visit: Payer: BLUE CROSS/BLUE SHIELD

## 2022-10-08 MED ORDER — BUDESONIDE-FORMOTEROL FUMARATE 80-4.5 MCG/ACT IN AERO
2 | Freq: Two times a day (BID) | RESPIRATORY_TRACT | 3 refills | Status: AC
Start: 2022-10-08 — End: 2022-10-17

## 2022-10-12 ENCOUNTER — Ambulatory Visit: Payer: BLUE CROSS/BLUE SHIELD

## 2022-10-16 ENCOUNTER — Ambulatory Visit: Payer: BC Managed Care – POS

## 2022-10-17 MED ORDER — FLUTICASONE-SALMETEROL 250-50 MCG/ACT IN AEPB
1 | Freq: Two times a day (BID) | RESPIRATORY_TRACT | 2 refills | Status: AC
Start: 2022-10-17 — End: ?

## 2023-01-05 ENCOUNTER — Ambulatory Visit: Payer: BC Managed Care – POS

## 2023-01-30 ENCOUNTER — Ambulatory Visit: Payer: BLUE CROSS/BLUE SHIELD

## 2023-02-27 ENCOUNTER — Ambulatory Visit: Payer: BLUE CROSS/BLUE SHIELD

## 2023-03-01 MED ORDER — YUVAFEM 10 MCG VA TABS
ORAL_TABLET | 3 refills
Start: 2023-03-01 — End: ?

## 2023-03-03 MED ORDER — YUVAFEM 10 MCG VA TABS
ORAL_TABLET | 3 refills
Start: 2023-03-03 — End: ?

## 2023-03-04 ENCOUNTER — Ambulatory Visit: Payer: BLUE CROSS/BLUE SHIELD

## 2023-03-10 MED ORDER — YUVAFEM 10 MCG VA TABS
ORAL_TABLET | 3 refills
Start: 2023-03-10 — End: ?

## 2023-03-10 MED ORDER — ESTRADIOL 10 MCG VA TABS
ORAL_TABLET | 3 refills | 30.00000 days
Start: 2023-03-10 — End: ?

## 2023-03-12 MED ORDER — YUVAFEM 10 MCG VA TABS
ORAL_TABLET | 3 refills
Start: 2023-03-12 — End: ?

## 2023-03-13 MED ORDER — ESTRADIOL 10 MCG VA TABS
10 ug | ORAL_TABLET | VAGINAL | 0 refills | Status: AC
Start: 2023-03-13 — End: ?

## 2023-05-04 ENCOUNTER — Other Ambulatory Visit: Payer: BLUE CROSS/BLUE SHIELD

## 2023-05-05 ENCOUNTER — Other Ambulatory Visit: Payer: BC Managed Care – POS

## 2023-05-11 ENCOUNTER — Ambulatory Visit: Payer: BC Managed Care – POS | Attending: Student in an Organized Health Care Education/Training Program

## 2023-05-11 DIAGNOSIS — L821 Other seborrheic keratosis: Secondary | ICD-10-CM

## 2023-05-11 MED ORDER — TRIAMCINOLONE ACETONIDE 0.1 % EX OINT
Freq: Two times a day (BID) | TOPICAL | 1 refills | Status: AC
Start: 2023-05-11 — End: ?

## 2023-05-11 NOTE — Progress Notes
 Robin Gatliff Rodman Key, MD  [facility] [place]  Division of Dermatology      Patient Name: Robin Spencer  MRN: [id number]  Date of Birth: September 15, 1950  Today's Date: 05/11/2023  Last Visit: Visit date not found    Referring Provider: No ref. provider found  Primary Care Provider: Pregler, Weyman Croon., MD    Subjective:   History:  Patient is a 73 y.o.-year-old female here for evaluation of a ''rash'' on the left upper back.  #Rash on the left upper back. History of back pain. Had biopsy done by outside dermatologist- reviewed with path on hand. Suggestive of spongiotic dermatitis  #also with growth on the right thigh    No personal history of skin cancer    ROS: Otherwise feeling well. No other skin complaints.     Past Medical History:  Past Medical History:   Diagnosis Date    Anxiety     Breast cancer (HCC/RAF)     Cataract     Eczema     Fibroids     History of migraine headaches     History of radiation therapy 04/2016    L breast    OSA (obstructive sleep apnea) 11/07/2019    Otosclerosis     Post-operative nausea and vomiting     Rectal fissure     Vitreous detachment        Social History:  Social History     Socioeconomic History    Marital status: Married   Tobacco Use    Smoking status: Never     Passive exposure: Never    Smokeless tobacco: Never   Substance and Sexual Activity    Alcohol use: No     Alcohol/week: 0.0 oz    Drug use: No    Sexual activity: Yes     Partners: Male     Birth control/protection: None   Social History Narrative    Patient is married, and has two children. She is of Ashkenazi Agricultural engineer.        Work: Retired from [employer] relations        Exercise:        Diet:        Transfusion:        Religion:       Outpatient Medications:  Current Outpatient Medications   Medication Sig    acyclovir 400 mg tablet TAKE 1 TABLET BY MOUTH TWO TIMES DAILY.    albuterol 90 mcg/act inhaler Inhale 2 puffs every six (6) hours as needed.    Ascorbic Acid (VITAMIN C) 1000 MG tablet     cyanocobalamin 500 mcg tablet Take 1 tablet (500 mcg total) by mouth daily.    estradiol (YUVAFEM) 10 mcg vaginal tablet Place 1 tablet (10 mcg total) vaginally two (2) times a week.    fluticasone-salmeterol 250-50 mcg/act diskus inhaler Inhale 1 puff two (2) times daily.    Homeopathic Products (TRAUMEEL) OINT Apply 4 g topically daily.    ketoconazole 2% shampoo  (Patient not taking: Reported on 06/05/2022.)    Multiple Vitamins-Minerals (ALGAE BASED CALCIUM) TABS Calcium  Magnesium  Vitamin D. (Patient not taking: Reported on 06/05/2022.)    Omega-3 Fatty Acids (FISH OIL PO) Take 1,400 mg by mouth two (2) times daily.    Probiotic Product (ALIGN) capsule     tamoxifen 20 mg tablet take 1 tablet by mouth every day    triamcinolone 0.5% cream Apply topically as needed for. (Patient  not taking: Reported on 06/05/2022.)    UNABLE TO FIND Med Name: [place] supplement- Epimedium, Tumeric, Japanese, Fleeceflower, EPA/DHA .    Zinc 50 MG TABS      No current facility-administered medications for this visit.       Allergies:  Allergies   Allergen Reactions    Penicillins Other (See Comments) and Rash     Ampicillin, flesh rash in 1980s    Menadiol Sodium Diphosphate Rash    Other      [place] coloring on meds such as advil- not allergic to actual med- just the coloring    Penicillin G        Objective/ Assessment/ Plan:     Physical Examination:  Patient was offered a chaperone, but declined.     General: Pleasant, in no acute distress  Psychiatric: Mood and affect are within normal limits    Skin type: 3  Limited exam of the upper back  Right inner thigh  Pertinent skin findings as below:   Left upper back with no primary dermatosis  Right inner thigh with waxy stuck on papule    Pertinent Lab/Work-up:  None    Objective/ Assessment/ Plan:     #Dermatitis, outside biopsy reviewed suggestive of spongiotic dermatitis. Clinically suggestive of notalgia paresthetica.  Notalgia paresthetica is a condition of the skin of the upper back with extreme pruritus in a localized area just below or medial to the scapula. Notalgia paresthetica is felt to be secondary to spinal nerve impingement, causing a sensory neuropathy and persistent itch. Pain, paresthesias, and hyperesthesias may coincide with the itch. Hyperpigmented or lichenified skin changes, if present, are due to chronic rubbing and scratching of the affected area.  - START CeraVe anti-itch with pramoxine PRN midl to moderate itching  - START triamcinolone ointment BID to AA PRN severe itching  Risks/benefits/alternatives of topical steroids were discussed. Discussed with patient risks of atrophy, telangectasias and hypopigmentation.     #Seborrheic Keratosis   - follow    ---    The above plan of care, diagnosis, order, and follow-up were discussed with the patient.  Questions related to this recommended plan of care were answered.      RTC PRN/ sooner if worsening    Ginnette Gates Rodman Key, MD ~ [employer] Clinical Instructor  Division of Dermatology. [address] of [place], [place]

## 2023-05-11 NOTE — Patient Instructions
 For mild to moderate itching: using Parmoxine daily prn itching      For severe  itching  Apply triamcinolone ointment to the affected area on the back twice daily for itching x2 weeks

## 2023-05-19 MED ORDER — YUVAFEM 10 MCG VA TABS
ORAL_TABLET | 0 refills
Start: 2023-05-19 — End: ?

## 2023-05-21 MED ORDER — YUVAFEM 10 MCG VA TABS
ORAL_TABLET | 0 refills | Status: AC
Start: 2023-05-21 — End: ?

## 2023-06-05 ENCOUNTER — Ambulatory Visit: Payer: BC Managed Care – POS

## 2023-06-25 MED ORDER — TAMOXIFEN CITRATE 20 MG PO TABS
20 mg | ORAL_TABLET | Freq: Every day | ORAL | 2 refills
Start: 2023-06-25 — End: ?

## 2023-06-26 MED ORDER — TAMOXIFEN CITRATE 20 MG PO TABS
20 mg | ORAL_TABLET | Freq: Every day | ORAL | 0 refills | Status: AC
Start: 2023-06-26 — End: ?

## 2023-07-03 ENCOUNTER — Other Ambulatory Visit: Payer: BC Managed Care – POS

## 2023-07-03 NOTE — Telephone Encounter
 This message can wait for Dr. Tobey Forte , no need for covering MD ( DR. Reather Campbell ) to address . Non urgent message .

## 2023-07-08 DIAGNOSIS — C50812 Malignant neoplasm of overlapping sites of left female breast: Secondary | ICD-10-CM

## 2023-07-08 DIAGNOSIS — H9191 Unspecified hearing loss, right ear: Secondary | ICD-10-CM

## 2023-07-08 DIAGNOSIS — Z1231 Encounter for screening mammogram for malignant neoplasm of breast: Secondary | ICD-10-CM

## 2023-07-08 DIAGNOSIS — R9389 Abnormal findings on diagnostic imaging of other specified body structures: Secondary | ICD-10-CM

## 2023-07-08 DIAGNOSIS — Z170 Estrogen receptor positive status [ER+]: Secondary | ICD-10-CM

## 2023-07-08 DIAGNOSIS — M810 Age-related osteoporosis without current pathological fracture: Secondary | ICD-10-CM

## 2023-07-08 NOTE — Patient Instructions
 Preventive care plan:  (1) Have a mammogram.   (2) Consider Dr. Jacqulin Maus, Dr. Delmar Ferrara, Dr. Andy Bannister for your husband.   (3) Work with Research officer, trade union to balance and strengthen your upper body muscles. Aaron Aas

## 2023-07-08 NOTE — H&P
 PATIENT:  Robin Spencer  MRN:  [id number]  DOB:  1950/04/22  DATE OF SERVICE:  07/16/2023    PRIMARY CARE PROVIDER: Mollyann Halbert, Kym Phoenix., MD    Cc: Welcome to Medicare Initial Preventive Physical Examination OR Follow up Annual Wellness Visit Follow up and Follow up of Chronic Conditions    An AI scribe was used for precharting dictation only prior to the patient visit.  The patient was not recorded during this visit.    Subjective:     Robin Spencer is a a 73 y.o. female who presents for Initial Preventive Physical Examination or Annual Wellness Visit Follow up  and discussion and follow up of chronic conditions as listed below.    Note: This is a one time examination performed within 12 months of the first effective date of Medicare Part B coverage.  [id number]; or as a follow up 12 months after the last Annual Wellness Exam [phone], or in a person 61 years of age or older regardless of Medicare coverage.     BLOOD TESTS ORDERED PRIOR TO VISIT.       Elevated PTH and Osteoporosis  - History of elevated PTH laboratory tests. Osteoporosis management discussed, with previous decline of bisphosphonate treatment. Tamoxifen  taken for adjuvant treatment of breast cancer, noted to reduce fracture risk.  - Laboratory tests including ionized calcium, PTH, and vitamin D were ordered.    Breast Cancer  - The patient underwent breast cancer surgery and radiation in 2018 and has been on adjuvant tamoxifen  since then.    Sleep Apnea  - The patient has noted symptomatic improvement on CPAP.  - She is followed by Doctor Miachel Adams in [place], [place].     Vaginal Atrophy  - The patient has previously used Yuvafem - resumed after normal D and C.     Hearing Loss  - The patient has a history of otosclerosis and is status post stapedectomy.   - Using hearing aides.     Thickened endometrium  - On ''screening'' done in [place].   - Had D and C, benign.   - Plan to continue to TAM for now.     Elevated LDL  ACC/AHA Cholesterol Management Guideline Recommendations:   Advises that moderate-statin therapy can be considered because 10 year ASCVD risk is between 5% and 10%. Patient is not currently receiving a statin. Consider patient-clinician discussion to include ASCVD risk enhancing factors to guide statin recommendation.    ASCVD Risk Score    10-year ASCVD risk  is 6.93% as of 11:12 AM on 07/16/2023.   10-year ASCVD risk with optimal risk factors  is 7.7%.   Values used to calculate ASCVD Risk Score    Age  74 y.o.     Gender  female   Race  European   HDL Cholesterol  79 mg/dL. (measured on 07/13/2023)   LDL Cholesterol  129 mg/dL. (measured on 07/13/2023)   Total Cholesterol  217 mg/dL. (measured on 07/13/2023)   Systolic Blood Pressure  104 mm Hg. (measured on 07/16/2023)   Blood Pressure Medication Present  No   Smoking Status  currently not a smoker   Diabetes Present  No     Click here for the [facility] ASCVD Cardiovascular Risk Estimator Plus tool Office manager).             Other MDs seen in continuity:  : Dr. Delmas Ferrari (dermatology), Dr. Clover Dao (oncology), Dr. Ronette Coffer (radiation oncology), Dr. Rosea Conch (rheumatology), Dr. Lauree Pomfret (  endocrinology).     [place] :  Dr. Sagun Shrestha,/Dr. Don Fritter,  [facility], Oncology,      Other MDs seen in continuity:  Dr.  Violet Grew (PCP, [facility]),  Dr. Miachel Adams,  (pulmonary/sleep apnea, [facility]),  , Dr. Marylene Snuffer (plastic surgeon, [facility]). Jeral Moll, NP, (OB/GYN, [facility]).     Patient Active Problem List    Diagnosis Date Noted    Thickened endometrium 07/08/2023     2025 on ''routine'' US  in [place] while on TAM. .       DJD (degenerative joint disease) of cervical spine 06/05/2022     2023 on CT.  Subtle intermittent tingling of left arm and hand.       Other fatigue 08/15/2021    Breast cancer (HCC/RAF)      2018, per Dr. Phillips Bread: Ms Bevin Bucks and I discussed starting tamoxifen  x5-10 years instead of an aromatase inhibitor for the following reasons: 1) she's on low-dose HRT (DHEA, which is converted to estrogen) for vaginal atrophy and 2) she has osteoporosis      Lymphadenopathy of head and neck 04/30/2021     2023, bilateral, noted in [place], follow up recommended.  ; follow up CT scan no adenopathy, report sent via [facility] 05/13/2021.       Abnormal RBC 04/30/2021     2023 outside lab read nucleated RBC= 1, follow up CBC ordered.       Hypervitaminosis D 09/28/2020     2022      Esophageal motility disorder 05/17/2020     2022:  Esophageal dysmotility:  Improved.  Bravo study not consistent with GERD.      History of COVID-19 05/17/2020     02/2020, dx'd in [place]      Vaginal atrophy 05/16/2020    Schatzki's ring 12/01/2019     On EGD 2021 By Dr. Arlo Lama, Bravo placed.  No Barrett's esophagus.       Dyspepsia      2021 with sx, saw Dr. Terry Ficks, plan: PPI, will plan for EGD w/ Bravo to confirm or disprove reflux given atypical presentation of symptoms. EGD 2021 Dr. Arlo Lama:  ESOPHAGUS, PROXIMAL, INLET PATCH (BIOPSY):  - Gastric oxyntic heteropia, consistent with inlet patch- No intestinal metaplasia, BRAVO placed. BRAVO:  Negative study for gastroesophageal reflux disease.  Consider workup of supragastric belching if clinically indicated..  Plan follow up Dr. Terry Ficks.      OSA (obstructive sleep apnea) 11/07/2019    Other fracture of right great toe, initial encounter for closed fracture 05/06/2019     2021:  Patient presents with right big toe pain, swelling, and tenderness after trauma last night. X-ray completed during this visit confirms fracture of big toe.  - Reviewed x-ray findings and images with patient  - STAT referral to patient's podiatrist for ongoing management  - Provided post-op shoe with firm sole and crutches to patient, discussed importance of avoiding any bending of the toe  - Reviewed symptomatic care with Tylenol/Ibuprofen PRN, elevation, ice            Orders Placed This Encounter    Referral to Podiatry            Mucous cyst of digit of left hand 06/02/2018    Fibromyalgia 05/10/2018 Better sleep with flexeril         Rash, Intermittent, noted after colds 04/29/2018    Asymptomatic varicose veins 03/01/2018    Vitreous floater, bilateral 06/11/2017     No retinal detachment, retinal tear, or vitreous hemorrhage  on dilated fundus exam. Signs and symptoms reviewed.      Increased PTH level 05/08/2017     Sees Dr. Burdette Carolin in endocrinology.       Syncope and collapse 06/25/2016     2018, ECG/Labs/MRI w/o gad unrevealing, saw neurology Dr. Louis Row, plan; EEG, followup       Immune to measles 05/09/2016     (+) Ab 2018      Invasive ductal carcinoma of left breast, ER+PR+HER2- 01/29/2016     2017, Bilateral tomosynthesis screening mammogram on 12/11/2015. There were no suspicious findings seen. She had postsurgical changes in both breasts. Patient then developed bilateral breast pain, left greater than right. She was sent for bilateral diagnostic ultrasounds on 01/15/2016. The right breast was negative. However, in the left breast, there was an irregular mass seen with indistinct margins, measuring 5 x 6 x 4 mm, at 12 o'clock, 6 cm from the nipple. Her left axilla was negative. Genetic testing: NO MUTATION DETECTED/This panel tests three Founder Mutations in the BRCA1/2 genes (BRCA1 187delAG, BRCA1 5385insC, BRCA2 6174delT; lumpectomy: sentinal node negative x 2.  TAM recommended by oncology, annual endometrial surveillance given history by GYN.       Breast pain 01/09/2016     [facility] plan per NP, imaging, lifestyle changes      Cataract, nuclear sclerotic, both eyes 11/29/2015     Not yet visually significant. Continue observation.       Status post LASIK surgery of both eyes 11/29/2015     Performed by Dr. Danielle Durham (2000 ?). Originally set with left eye for distance and low myopia for right.      Fibroid 04/09/2015     2017 on ultrasound.       BPPV (benign paroxysmal positional vertigo) 07/19/2013    Menopause 04/14/2013    Seborrheic dermatitis 05/11/2012    Genital HSV 04/07/2012    Palpitations 04/07/2012     2009 ER PACs, PVCs, sinus arrythmia, ECHO, Stress ECHO WNL 2009      OA (osteoarthritis) 04/07/2012     Hands 2011, anti CCP neg, no evidence inflammation      Visit for preventive health examination 04/07/2012     Colo noscopy= hyperplastic polyps 2017      Hearing loss, right 04/07/2012     Otosclerosis, s/p stapedectomy      Eye allergies 01/08/2012     Not having significant symptoms at this time, but could use over the counter anti-histamine eye drops, like ketotifen or artificial tears as needed if symptoms recur.      Osteoporosis 11/10/2011     DEXA 2013, spine-2.4, femoral neck -2.9. On HT for menopause sx and as osteoporosis rx.; PA lumbar spine T-score of -2.7, will begin TAM per oncology, spine -3.2 , referred to endocrinology. Saw Dr. Marlis Simper in endocrinology, plan: alendronate .  Patient declined bisphosphonate and other pharmacotherapy 2022.            Allergies   Allergen Reactions    Penicillins Other (See Comments) and Rash     Ampicillin, flesh rash in 1980s    Menadiol Sodium Diphosphate Rash    Other      [place] coloring on meds such as advil- not allergic to actual med- just the coloring    Penicillin G        Current Outpatient Medications (Respiratory Meds)   Medication Sig Dispense Refill    albuterol  90 mcg/act inhaler Inhale 2 puffs every six (  6) hours as needed. 3.7 g 1    fluticasone -salmeterol 250-50 mcg/act diskus inhaler Inhale 1 puff two (2) times daily. 60 each 2     Current Outpatient Medications (GI Meds)   Medication Sig Dispense Refill    Probiotic Product (ALIGN) capsule        Current Outpatient Medications (Nutrition)   Medication Sig Dispense Refill    Ascorbic Acid (VITAMIN C) 1000 MG tablet       Omega-3 Fatty Acids (FISH OIL PO) Take 1,400 mg by mouth two (2) times daily.      Zinc  50 MG TABS        Current Outpatient Medications (GU Meds)   Medication Sig Dispense Refill    YUVAFEM  10 MCG vaginal tablet PLACE 1 TABLET VAGINALLY TWO TIMES A WEEK. 24 tablet 0     Current Outpatient Medications (Heme Meds)   Medication Sig Dispense Refill    cyanocobalamin 500 mcg tablet Take 1 tablet (500 mcg total) by mouth daily.       Current Outpatient Medications (Chemotherapy)   Medication Sig Dispense Refill    tamoxifen  20 mg tablet Take 1 tablet (20 mg total) by mouth daily. 30 tablet 0     Current Outpatient Medications (Antibiotics)   Medication Sig Dispense Refill    acyclovir  400 mg tablet TAKE 1 TABLET BY MOUTH TWO TIMES DAILY. 180 tablet 3     Current Outpatient Medications (Derm Meds)   Medication Sig Dispense Refill    triamcinolone  0.1% ointment Apply topically two (2) times daily. APPLY AND GENTLY MASSAGE INTO AFFECTED AREA(S) on the back TWICE DAILY PRN severe itching x2 weeks 80 g 1     Current Outpatient Medications (Miscellaneous)   Medication Sig Dispense Refill    Homeopathic Products (TRAUMEEL ) OINT Apply 4 g topically daily. 50 g 3     Current Outpatient Medications (Other)   Medication Sig Dispense Refill    UNABLE TO FIND Med Name: [place] supplement- Epimedium, Tumeric, Japanese, Fleeceflower, EPA/DHA .           Past Medical History:   Diagnosis Date    Anxiety     Breast cancer (HCC/RAF)     Cataract     Eczema     Fibroids     History of migraine headaches     History of radiation therapy 04/2016    L breast    OSA (obstructive sleep apnea) 11/07/2019    Otosclerosis     Post-operative nausea and vomiting     Rectal fissure     Vitreous detachment        Past Surgical History:   Procedure Laterality Date    BREAST BIOPSY  1986    Fibroadenoma, ductal hyperplasia    Laparoscopy for subfertility      Left finger cyst removal x 2      Stapedectomy, right         Family History   Problem Relation Age of Onset    Hypertension Father     Stroke Father     Hyperlipidemia Father     Depression Father     Prostate cancer Father 75    Hypertension Mother     Asthma Mother     Endometrial cancer Mother 42    Hyperthyroidism Mother Prostate cancer Brother 36    Breast cancer Neg Hx     Ovarian cancer Neg Hx     Malignant hypertension Neg Hx  Robin Spencer  reports that she has never smoked. She has never been exposed to tobacco smoke. She has never used smokeless tobacco. She reports that she does not drink alcohol and does not use drugs.  Lives with her husband in [place] and also frequently in [place], children in [place].        Opioid use:     [  ]  Yes, urine drug screen ordered.   [  ]  Yes. No urine drug screen ordered, reason;   [  x]  No    On Immunnosuppressive Rx:      [  ]  Yes  [  x]  No      Lung Cancer Screening Assessment (age 25-80)    [   x]  Less than 20 pack years tobacco history OR nonsmoker  [   ]  Quit smoking more than 15 years ago OR nonsmoker  [x   ]  Lung cancer screening recommended and discussed  [  ]  Accepted  [  ]  Declined  [   ]  Lung cancer screening not discussed because of age/comorbidities    AAA Screening Assessment (once, age 65-75 years)  [ ]  Males between 78 and 19 years of age who smoked at least 100 cigarettes  [  ] Males or females with a family history of AAA  [  x] AAA screening not recommended because of lack of risk factors OR age/comorbidities        Diet: Omnivore    Physical Activities, including limitations: Dancing, walking. Personal trainer, pilates twice weekly.     Self assessment of health: Good.     Social support: Husband, friends in [place] and [place].     Activities of Daily Living:       [  ] Needs assistance with:  [  ] bathing: personal hygiene and grooming; [  ] dressing: dressing and undressing; [  ] transferring: movement and mobility; [  ] toileting: continence-related tasks including control and hygiene; [  ] eating: preparing food and feeding    [  x] Independent in ADLs     Instrumental Activities of Daily Living :      [  ] Needs assistance with: [  ] cooking, [  ] cleaning, [  ] finances, [  ] shopping, [  ] healthcare and medication, [  ] using telephones and technology, [  ] transportation, and [  ] caring for other individuals and pets.    [ x ] Independent in IADLs     Fall risk:   Very careful.     (1) Falls in the past year?  No  (2) Injury from fall in the past year?    [   ] Referred to physical therapy for gait training.     Home Safety:  No concerns re: Slippery surfaces, poor lighting, etc.     (1) Are you worried about clutter in your home?  (2) Do you have poor lighting by day or night in your home?  (3) Are there slippery surfaces in your home?  (4) Are you worried about falling when using your toilet, bath or shower facilities?        Goals of Care/DPA: On file from 5/23 (misfiled as ''temporary''). Previously listed husband Ratasha Fabre as preferred Management consultant. Son Arlyce Lambert would be alternate.       Immunization History   Administered Date(s) Administered    COVID-19  US  SARS-CoV-2 vaccine, unspecified 04/06/2019    COVID-19, mRNA, (Moderna) 100 mcg/0.5 mL 04/06/2019, 05/04/2019, 06/30/2019, 07/28/2019, 01/09/2020, 02/04/2020, 06/28/2020    Hepatitis A, Adult 12/10/2006, 04/24/2016    Hepatitis A, unspecified formulation 12/10/2006    Influenza vaccine IM quadrivalent (Afluria Quad) (PF) SYR (74 years of age and older) 12/22/2018, 12/29/2019    Tdap 04/26/2010, 01/24/2017, 12/22/2018    Typhoid Inactivated, ViCPs 04/24/2016    influenza vaccine IM cell culture quadrivalent (Flucelvax QUAD) (PF) SYR (64 months of age and older) 12/28/2020    influenza vaccine IM quadrivalent (Fluzone Quad) (PF) SYR/SDV (4 months of age and older) 02/14/2014    influenza vaccine IM quadrivalent adjuvanted (FluAD Quad) (PF) SYR (6 years of age and older) 12/20/2019, 01/17/2022    influenza vaccine IM quadrivalent high dose (Fluzone High Dose Quad) (PF) SYR (36 years of age and older) 12/08/2018    influenza vaccine IM trivalent adjuvanted (FluAD) (PF) SYR (66 years of age and older) 01/12/2018, 02/07/2023    influenza vaccine IM trivalent high dose (Fluzone High Dose) (PF) SYR (35 years of age and older) 01/16/2017    influenza, unspecified formulation 02/01/1999, 01/22/2000, 01/28/2002, 02/14/2014, 02/01/2016, 01/12/2018    pneumococcal conjugate vaccine 20-valent (Prevnar 20) 01/18/2021    pneumococcal polysaccharide vaccine 23-valent (Pneumovax) 05/18/2018    zoster live vaccine (Zostavax) 04/15/2012    zoster vac recomb adjuvanted (Shingrix) 06/26/2016, 06/02/2017         Health Maintenance   Topic Date Due    COVID-19 Vaccine(Tracks primary and booster doses, not sup/immunocomp) (9 - 2024-25 season) 11/16/2022    Preventive Wellness Visit  06/05/2023    Breast Cancer Screening: Mammogram  03/11/2024    Colorectal Cancer Screening  10/07/2025    Advance Directive  07/15/2028    Tdap/Td Vaccine (4 - Td or Tdap) 12/21/2028    Hepatitis B Screening  Completed    Influenza Vaccine  Completed    Pneumococcal Vaccine  Completed    Osteoporosis Early Detection DEXA Scan  Completed    Hepatitis C Screening  Completed    Shingles (Shingrix) Vaccine  Completed    Cervical Cancer Screening: HPV Testing  Discontinued    Cervical Ca Screening: PAP Smear  Discontinued         Review of Systems:     Depression screen:    (1) During the past month, have you been bothered by feeling down, depressed or helpless? No  (2) During the past month, have you often been bothered by little interest or pleasure in doing things? No    ''Living best life. ''    Hearing: Using hearing aids.     Vision: Saw last month.     Const:  No significant weight gain or loss  Cor:  No exertional chest pain.  Pulm:  No exertional dyspnea.  Endo:  Postmenopausal MS:  Had ''seizing'' of muscles of upper back.       Objective:       BP 104/62 Comment: right arm ~ Pulse 74  ~ Temp 36.4 ?C (97.6 ?F) (Forehead)  ~ Resp 18  ~ Ht 5' 4'' (1.626 m)  ~ Wt 132 lb 1.6 oz (59.9 kg)  ~ LMP  (LMP Unknown)  ~ SpO2 98%  ~ BMI 22.67 kg/m?   Body mass index is 22.67 kg/m?Robin Spencer      System Check if normal Positive or additional negative findings   Constit  [x]  General appearance     Eyes  []  Conj/Lids []  Pupils  []   Fundi     HENMT  []  External ears/nose []  Otoscopy   []  Gross Hearing []  Nasal mucosa   []  Lips/teeth/gums []  Oropharynx    []  mucus membranes []  Head     Neck  [x]  Inspection/palpation [x]  Thyroid     Resp  [x]  Effort [x]  Wheezing    [x]  Auscultation  []  Crackles     CV  [x]  Rhythm/rate   [x]  Murmurs   [x]  LEE   []  JVP non-elevated    Normal pulses:   []  Radial []  Femoral  []  Pedal     Breast  [x]  Inspection [x]  Palpation     GI  [x]  abd masses    [x]  tenderness   [x]  rebound/guarding   []  Liver/spleen []  Rectal     GU  M: []  Scrotum []  Penis []  Prostate   F:  []  External []  vaginal wall        []  Cervix  []  mucus        []  Uterus    []  Adnexa      Lymph  []  Neck []  Axillae []  Groin     MSK Specify site examined:    []  Inspect/palp []  ROM   []  Stability []  Strength/tone         Skin  [x]  Inspection [x]  Palpation     Neuro  []  CN2-12 intact grossly   []  Alert and oriented   []  DTR      []  Muscle strength      []  Sensation   []  Gait/balance     Psych  [x]  Insight/judgement     [x]  Mood/affect    [x]  Gross cognition   Clock drawing intact.      Patient informed of chaperone program: Patient/Legal Guardian Declined          LABS:    Lab Results   Component Value Date    WBC 4.61 08/16/2021    HGB 13.8 08/16/2021    HCT 41.6 08/16/2021    MCV 97.9 08/16/2021    PLT 192 08/16/2021     Lab Results   Component Value Date    CREAT 0.71 07/13/2023    BUN 22 07/13/2023    NA 142 07/13/2023    K 4.7 07/13/2023    CL 107 (H) 07/13/2023    CO2 24 07/13/2023     Lab Results   Component Value Date    ALT 16 07/13/2023    AST 21 07/13/2023    ALKPHOS 58 07/13/2023    BILITOT 0.4 07/13/2023     Lab Results   Component Value Date    CHOL 217 07/13/2023    CHOLDLCAL 144 (H) 04/20/2014    CHOLHDL 79 07/13/2023    TRIGLY 99 04/20/2014     Lab Results   Component Value Date    TSH 1.3 07/13/2023     Lab Results   Component Value Date    HGBA1C 5.5 07/13/2023             No imaging has been resulted in the last 365 days        Assessment & Plan:     Assessment:     Elevated PTH and Osteoporosis  - History of elevated PTH laboratory tests. Osteoporosis management discussed, with previous decline of bisphosphonate treatment. Tamoxifen  taken for adjuvant treatment of breast cancer, noted to reduce fracture risk.  - Laboratory tests including ionized calcium, PTH, and vitamin D were ordered.    Breast Cancer  -  The patient underwent breast cancer surgery and radiation in 2018 and has been on adjuvant tamoxifen  since then.    Sleep Apnea  - The patient has noted symptomatic improvement on CPAP.  - She is followed by Doctor Miachel Adams in [place], [place].     Vaginal Atrophy  - The patient has previously used Yuvafem - resumed after normal D and C.     Hearing Loss  - The patient has a history of otosclerosis and is status post stapedectomy.   - Using hearing aides.     Thickened endometrium  - On ''screening'' done in [place].   - Had D and C, benign.   - Plan to continue to TAM for now.     -  Upper back ''seizing up''  - Intermittent, 4x  - Will do work with trainer, does not want PT now.       Annual Preventive Exam  Patient Active Problem List   Diagnosis    Osteoporosis    Eye allergies    Genital HSV    Palpitations    OA (osteoarthritis)    Visit for preventive health examination    Hearing loss, right    Seborrheic dermatitis    Menopause    BPPV (benign paroxysmal positional vertigo)    Fibroid    Cataract, nuclear sclerotic, both eyes    Status post LASIK surgery of both eyes    Breast pain    Invasive ductal carcinoma of left breast, ER+PR+HER2-    Immune to measles    Syncope and collapse    Increased PTH level    Vitreous floater, bilateral    Asymptomatic varicose veins    Rash, Intermittent, noted after colds    Fibromyalgia    Mucous cyst of digit of left hand    Other fracture of right great toe, initial encounter for closed fracture    OSA (obstructive sleep apnea)    Dyspepsia    Schatzki's ring    Vaginal atrophy    Esophageal motility disorder    History of COVID-19    Hypervitaminosis D    Lymphadenopathy of head and neck    Abnormal RBC    Breast cancer (HCC/RAF)    Other fatigue    DJD (degenerative joint disease) of cervical spine    Thickened endometrium     Health Maintenance   Topic Date Due    COVID-19 Vaccine(Tracks primary and booster doses, not sup/immunocomp) (9 - 2024-25 season) 11/16/2022    Preventive Wellness Visit  06/05/2023    Breast Cancer Screening: Mammogram  03/11/2024    Colorectal Cancer Screening  10/07/2025    Advance Directive  07/15/2028    Tdap/Td Vaccine (4 - Td or Tdap) 12/21/2028    Hepatitis B Screening  Completed    Influenza Vaccine  Completed    Pneumococcal Vaccine  Completed    Osteoporosis Early Detection DEXA Scan  Completed    Hepatitis C Screening  Completed    Shingles (Shingrix) Vaccine  Completed    Cervical Cancer Screening: HPV Testing  Discontinued    Cervical Ca Screening: PAP Smear  Discontinued         Personalized Prevention Plan Services and  Management of Medical Conditions:     Health Maintenance   Topic Date Due    COVID-19 Vaccine(Tracks primary and booster doses, not sup/immunocomp) (9 - 2024-25 season) 11/16/2022    Preventive Wellness Visit  06/05/2023    Breast Cancer Screening: Mammogram  03/11/2024  Colorectal Cancer Screening  10/07/2025    Advance Directive  07/15/2028    Tdap/Td Vaccine (4 - Td or Tdap) 12/21/2028    Hepatitis B Screening  Completed    Influenza Vaccine  Completed    Pneumococcal Vaccine  Completed    Osteoporosis Early Detection DEXA Scan  Completed    Hepatitis C Screening  Completed    Shingles (Shingrix) Vaccine  Completed    Cervical Cancer Screening: HPV Testing  Discontinued    Cervical Ca Screening: PAP Smear  Discontinued     Orders Placed This Encounter    Mammo tomosynthesis, screening, bilat breast    Comprehensive Metabolic Panel    Vitamin D,25-Hydroxy    TSH    Chol,LDL,Quant    Cholesterol    Cholesterol, HDL    Hgb A1c    Calcium,Ionized    PTH,Intact & Calcium     Personalized Health Advice (if applicable, includes referrals to health education or preventive counseling services or programs):      Patient Instructions   Preventive care plan:  (1) Have a mammogram.   (2) Consider Dr. Jacqulin Maus, Dr. Delmar Ferrara, Dr. Andy Bannister for your husband.   (3) Work with [employer], [employer] to balance and strengthen your upper body muscles. .   Return in about 1 year (around 07/15/2024) for Annual Wellness- 30 minutes.        The above plan of care, diagnosis, order, and follow-up were discussed with the patient. Questions related to this recommended plan of care were answered.    Verbal or written information was provided to the patient about end of life planning/advanced care planning.     The services and diagnoses listed above in addition to the Medicare Annual Visit constituted separate and distinct services and diagnoses that were addressed and discussed with the patient today.  Author:  Kym Phoenix. Haneen Bernales 07/16/2023 11:12 AM

## 2023-07-09 ENCOUNTER — Other Ambulatory Visit: Payer: BC Managed Care – POS

## 2023-07-13 NOTE — Telephone Encounter
 Form has been uploaded to patient chart

## 2023-07-16 ENCOUNTER — Ambulatory Visit: Payer: BC Managed Care – POS

## 2023-07-16 MED ORDER — TAMOXIFEN CITRATE 20 MG PO TABS
20 mg | ORAL_TABLET | Freq: Every day | ORAL | 3 refills | 90.00 days | Status: AC
Start: 2023-07-16 — End: ?

## 2023-07-16 NOTE — Nursing Note
 Patient has completed the Pre- check in Medication list on line. Lab & Pharmacy has been confirmed for today .

## 2023-08-13 MED ORDER — ACYCLOVIR 400 MG PO TABS
400 mg | ORAL_TABLET | Freq: Two times a day (BID) | ORAL | 3 refills
Start: 2023-08-13 — End: ?

## 2023-08-14 MED ORDER — ACYCLOVIR 400 MG PO TABS
400 mg | ORAL_TABLET | Freq: Two times a day (BID) | ORAL | 3 refills
Start: 2023-08-14 — End: ?

## 2023-08-14 MED ORDER — ACYCLOVIR 400 MG PO TABS
400 mg | ORAL_TABLET | Freq: Two times a day (BID) | ORAL | 3 refills | 30.00 days | Status: AC
Start: 2023-08-14 — End: ?

## 2023-08-15 MED ORDER — YUVAFEM 10 MCG VA TABS
ORAL_TABLET | 0 refills
Start: 2023-08-15 — End: ?

## 2023-08-17 MED ORDER — YUVAFEM 10 MCG VA TABS
ORAL_TABLET | 0 refills
Start: 2023-08-17 — End: ?

## 2023-08-19 MED ORDER — YUVAFEM 10 MCG VA TABS
ORAL_TABLET | VAGINAL | 0 refills | 28.00000 days | Status: AC
Start: 2023-08-19 — End: ?

## 2023-08-24 ENCOUNTER — Telehealth: Payer: BLUE CROSS/BLUE SHIELD

## 2023-08-24 NOTE — Telephone Encounter
 Based on my chart unviewed notifications:        Called [facility] pharmacy, per Lyell Samuel, patient picked up Acyclovir  400 mg tablets on 08/15/2023

## 2023-11-08 MED ORDER — YUVAFEM 10 MCG VA TABS
ORAL_TABLET | 0 refills
Start: 2023-11-08 — End: ?

## 2023-11-11 MED ORDER — YUVAFEM 10 MCG VA TABS
ORAL_TABLET | VAGINAL | 3 refills | 30.00000 days | Status: AC
Start: 2023-11-11 — End: ?

## 2023-12-30 ENCOUNTER — Other Ambulatory Visit: Payer: PRIVATE HEALTH INSURANCE

## 2024-01-06 ENCOUNTER — Other Ambulatory Visit: Payer: PRIVATE HEALTH INSURANCE

## 2024-04-09 ENCOUNTER — Other Ambulatory Visit: Payer: PRIVATE HEALTH INSURANCE

## 2024-04-11 DIAGNOSIS — C50812 Malignant neoplasm of overlapping sites of left female breast: Secondary | ICD-10-CM

## 2024-04-11 DIAGNOSIS — Z7184 Encounter for health counseling related to travel: Secondary | ICD-10-CM

## 2024-04-11 DIAGNOSIS — Z170 Estrogen receptor positive status [ER+]: Principal | ICD-10-CM

## 2024-04-12 MED ORDER — ATOVAQUONE-PROGUANIL HCL 250-100 MG PO TABS
ORAL_TABLET | ORAL | 0 refills | 30.00000 days | Status: AC
Start: 2024-04-12 — End: ?
